# Patient Record
Sex: Male | Born: 1945
Health system: Southern US, Community
[De-identification: ages and names within clinical notes are randomized; demographics above are authoritative.]

## PROBLEM LIST (undated history)

## (undated) DIAGNOSIS — J189 Pneumonia, unspecified organism: Secondary | ICD-10-CM

## (undated) DIAGNOSIS — I10 Essential (primary) hypertension: Secondary | ICD-10-CM

## (undated) DIAGNOSIS — M1990 Unspecified osteoarthritis, unspecified site: Secondary | ICD-10-CM

## (undated) DIAGNOSIS — G4730 Sleep apnea, unspecified: Secondary | ICD-10-CM

## (undated) HISTORY — PX: KNEE ARTHROPLASTY: SHX992

## (undated) HISTORY — PX: VASECTOMY REVERSAL: SHX243

---

## 1968-02-19 HISTORY — PX: VASECTOMY: SHX75

## 2014-01-27 ENCOUNTER — Other Ambulatory Visit: Payer: Self-pay | Admitting: Family Medicine

## 2014-01-27 DIAGNOSIS — Z0000 Encounter for general adult medical examination without abnormal findings: Secondary | ICD-10-CM

## 2014-02-02 ENCOUNTER — Ambulatory Visit
Admission: RE | Admit: 2014-02-02 | Discharge: 2014-02-02 | Disposition: A | Discharge status: Home / Self Care | Payer: Medicare Other | Source: Ambulatory Visit | Attending: Family Medicine | Admitting: Family Medicine

## 2014-02-02 DIAGNOSIS — Z0000 Encounter for general adult medical examination without abnormal findings: Secondary | ICD-10-CM

## 2016-03-29 ENCOUNTER — Ambulatory Visit (INDEPENDENT_AMBULATORY_CARE_PROVIDER_SITE_OTHER): Payer: PPO | Admitting: Internal Medicine

## 2016-03-29 DIAGNOSIS — Z7189 Other specified counseling: Secondary | ICD-10-CM

## 2016-03-29 DIAGNOSIS — Z9189 Other specified personal risk factors, not elsewhere classified: Secondary | ICD-10-CM

## 2016-03-29 DIAGNOSIS — Z789 Other specified health status: Secondary | ICD-10-CM

## 2016-03-29 DIAGNOSIS — Z7184 Encounter for health counseling related to travel: Secondary | ICD-10-CM

## 2016-03-29 DIAGNOSIS — Z23 Encounter for immunization: Secondary | ICD-10-CM

## 2016-03-29 MED ORDER — AZITHROMYCIN 500 MG PO TABS: 500.0000 mg | ORAL_TABLET | Freq: Every day | ORAL | 0 refills | Status: DC

## 2016-03-29 MED ORDER — TYPHOID VACCINE PO CPDR: 1.0000 | DELAYED_RELEASE_CAPSULE | ORAL | 0 refills | Status: DC

## 2016-03-29 NOTE — Progress Notes (Signed)
Subjective:   Ralph Davis is a 71 y.o. male who presents to the Infectious Disease clinic for travel consultation. Planned departure date: march 30   Planned return date: April 10 Countries of travel: [place] Areas in country: rural except 2 days in [place] Accommodations: hotels Purpose of travel: mission Prior travel out of [place]: yes, [place]  uptodate on tetanus, nad flu    Objective:   Medications: Lisinopirl, hctz   Assessment:   No contraindications to travel. none   Plan:   Will recommend hep a and typhoid vaccine  Will give rx for vivotif  Traveler's diarrhea = gave rx for azithromycin  Mosquito bite prevention, no need for malaria proph

## 2016-04-01 DIAGNOSIS — E669 Obesity, unspecified: Secondary | ICD-10-CM | POA: Diagnosis not present

## 2016-04-01 DIAGNOSIS — Z0000 Encounter for general adult medical examination without abnormal findings: Secondary | ICD-10-CM | POA: Diagnosis not present

## 2016-04-01 DIAGNOSIS — R7303 Prediabetes: Secondary | ICD-10-CM | POA: Diagnosis not present

## 2016-04-01 DIAGNOSIS — Z23 Encounter for immunization: Secondary | ICD-10-CM | POA: Diagnosis not present

## 2016-04-01 DIAGNOSIS — I10 Essential (primary) hypertension: Secondary | ICD-10-CM | POA: Diagnosis not present

## 2016-04-01 DIAGNOSIS — Z125 Encounter for screening for malignant neoplasm of prostate: Secondary | ICD-10-CM | POA: Diagnosis not present

## 2016-04-01 DIAGNOSIS — E785 Hyperlipidemia, unspecified: Secondary | ICD-10-CM | POA: Diagnosis not present

## 2016-04-01 DIAGNOSIS — Z6829 Body mass index (BMI) 29.0-29.9, adult: Secondary | ICD-10-CM | POA: Diagnosis not present

## 2016-04-02 DIAGNOSIS — E785 Hyperlipidemia, unspecified: Secondary | ICD-10-CM | POA: Diagnosis not present

## 2016-04-02 DIAGNOSIS — I10 Essential (primary) hypertension: Secondary | ICD-10-CM | POA: Diagnosis not present

## 2016-04-02 DIAGNOSIS — Z0000 Encounter for general adult medical examination without abnormal findings: Secondary | ICD-10-CM | POA: Diagnosis not present

## 2016-04-02 DIAGNOSIS — Z6829 Body mass index (BMI) 29.0-29.9, adult: Secondary | ICD-10-CM | POA: Diagnosis not present

## 2016-04-02 DIAGNOSIS — R7303 Prediabetes: Secondary | ICD-10-CM | POA: Diagnosis not present

## 2016-04-02 DIAGNOSIS — E669 Obesity, unspecified: Secondary | ICD-10-CM | POA: Diagnosis not present

## 2016-04-02 DIAGNOSIS — Z125 Encounter for screening for malignant neoplasm of prostate: Secondary | ICD-10-CM | POA: Diagnosis not present

## 2016-05-02 DIAGNOSIS — J209 Acute bronchitis, unspecified: Secondary | ICD-10-CM | POA: Diagnosis not present

## 2016-06-04 IMAGING — US US AORTA SCREENING (MEDICARE)
1 series · 12 of 12 positions shown · non-contrast
Comparison: None.

CLINICAL DATA: Medicare screening exam for abdominal aortic
aneurysm.

EXAM:
ABDOMINAL AORTA SCREENING ULTRASOUND
TECHNIQUE: Ultrasound examination of the abdominal aorta was performed as a
screening evaluation for abdominal aortic aneurysm.

[Series 1: us aorta screening (medicare) · 0.41mm/px · 12 of 12 slices shown]
[im 1/12]
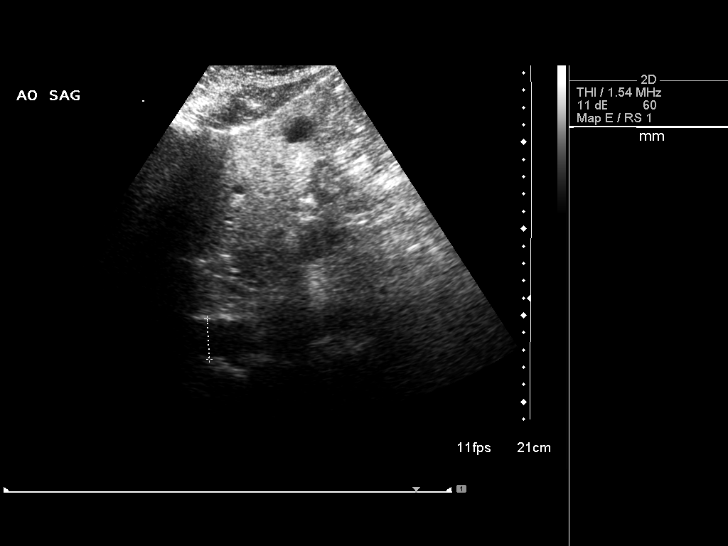
[im 2/12]
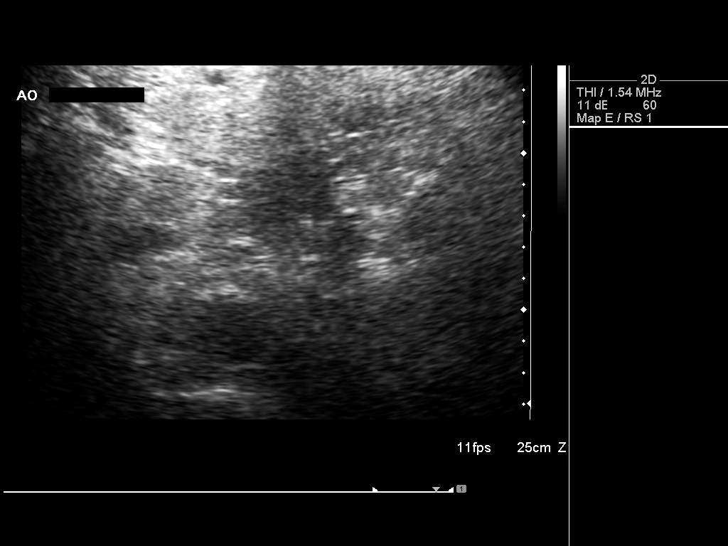
[im 3/12]
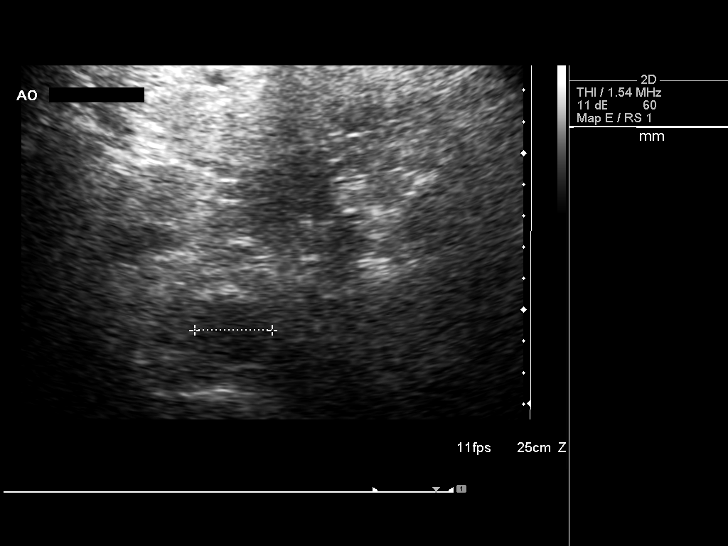
[im 4/12]
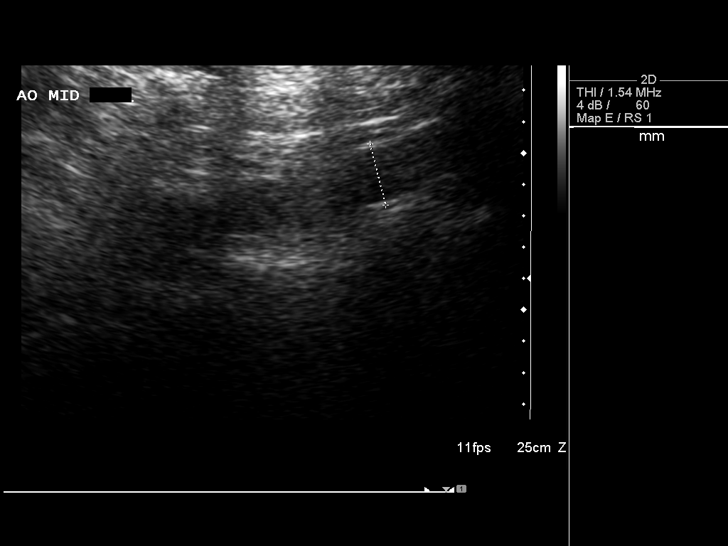
[im 5/12]
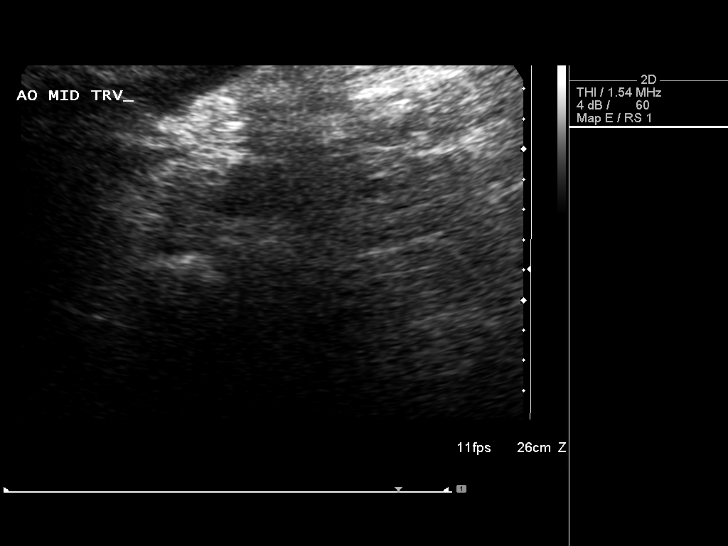
[im 6/12]
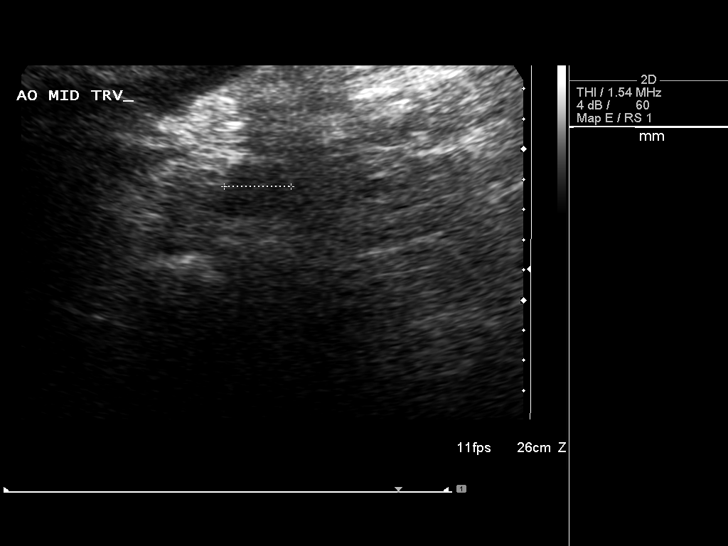
[im 7/12]
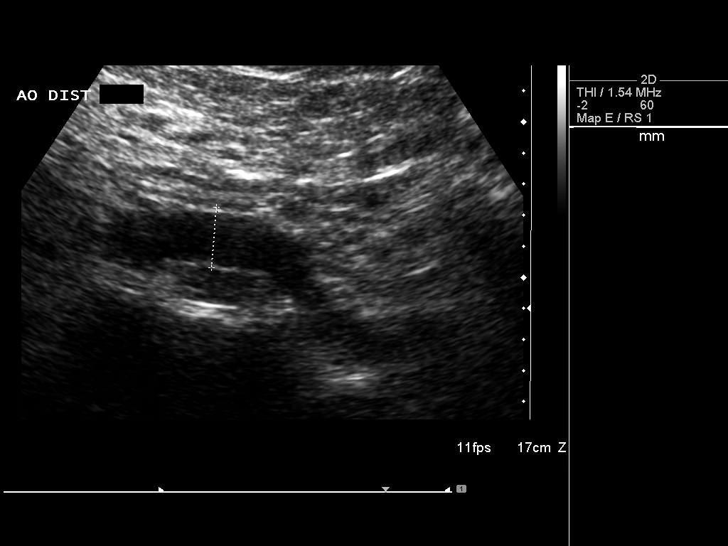
[im 8/12]
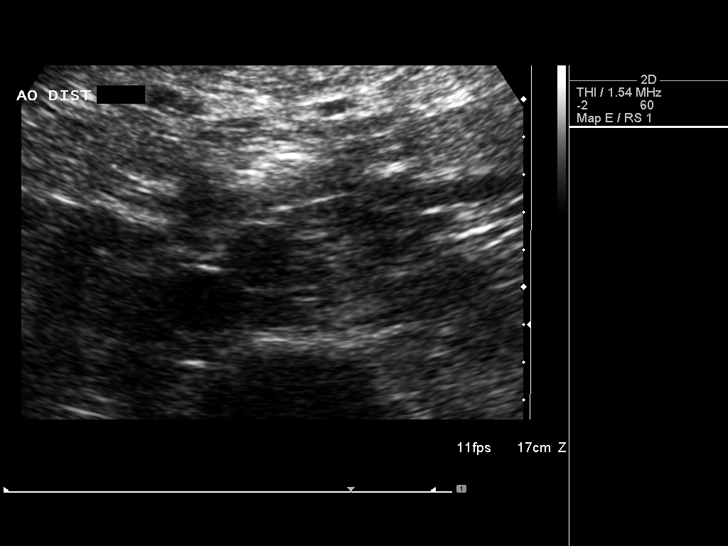
[im 9/12]
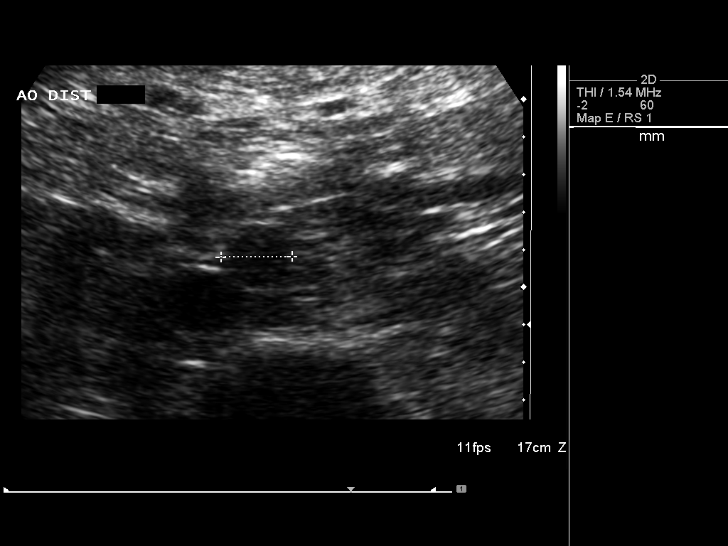
[im 10/12]
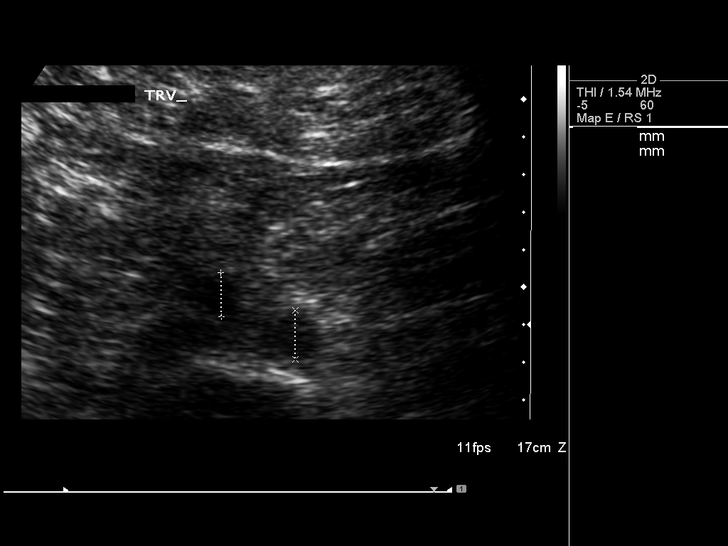
[im 11/12]
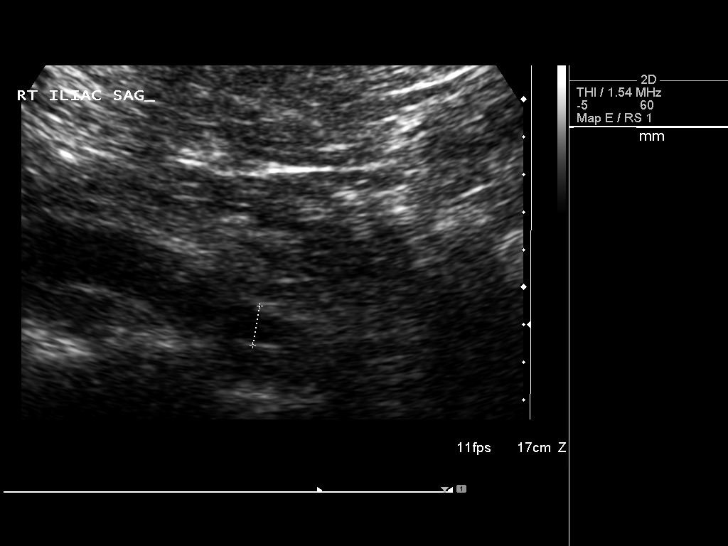
[im 12/12]
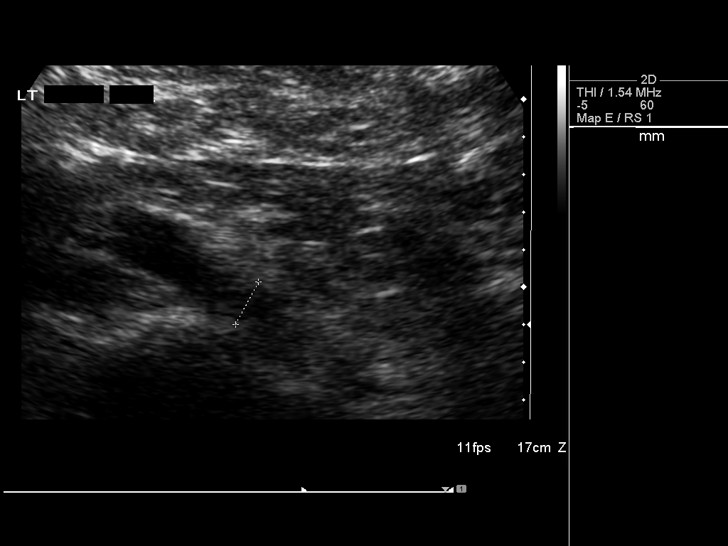

[12 of 12 positions shown; findings below may reference images not displayed]

FINDINGS: Abdominal Aorta

No aneurysm identified.

Maximum AP

Diameter:  2.3 cm

Maximum TRV

Diameter: 2.5 cm

The iliac arteries exhibit maximal diameter of 1.3 cm
IMPRESSION: There is no abdominal aortic aneurysm.

## 2016-10-14 DIAGNOSIS — M1712 Unilateral primary osteoarthritis, left knee: Secondary | ICD-10-CM | POA: Diagnosis not present

## 2016-10-14 DIAGNOSIS — Z6832 Body mass index (BMI) 32.0-32.9, adult: Secondary | ICD-10-CM | POA: Diagnosis not present

## 2016-10-14 DIAGNOSIS — I8393 Asymptomatic varicose veins of bilateral lower extremities: Secondary | ICD-10-CM | POA: Diagnosis not present

## 2016-10-14 DIAGNOSIS — R7303 Prediabetes: Secondary | ICD-10-CM | POA: Diagnosis not present

## 2016-10-14 DIAGNOSIS — L309 Dermatitis, unspecified: Secondary | ICD-10-CM | POA: Diagnosis not present

## 2016-10-14 DIAGNOSIS — E669 Obesity, unspecified: Secondary | ICD-10-CM | POA: Diagnosis not present

## 2016-10-14 DIAGNOSIS — I10 Essential (primary) hypertension: Secondary | ICD-10-CM | POA: Diagnosis not present

## 2016-10-14 DIAGNOSIS — E785 Hyperlipidemia, unspecified: Secondary | ICD-10-CM | POA: Diagnosis not present

## 2016-10-15 DIAGNOSIS — E669 Obesity, unspecified: Secondary | ICD-10-CM | POA: Diagnosis not present

## 2016-10-15 DIAGNOSIS — R7303 Prediabetes: Secondary | ICD-10-CM | POA: Diagnosis not present

## 2016-10-15 DIAGNOSIS — L309 Dermatitis, unspecified: Secondary | ICD-10-CM | POA: Diagnosis not present

## 2016-10-15 DIAGNOSIS — I10 Essential (primary) hypertension: Secondary | ICD-10-CM | POA: Diagnosis not present

## 2016-10-15 DIAGNOSIS — I8393 Asymptomatic varicose veins of bilateral lower extremities: Secondary | ICD-10-CM | POA: Diagnosis not present

## 2016-10-15 DIAGNOSIS — M1712 Unilateral primary osteoarthritis, left knee: Secondary | ICD-10-CM | POA: Diagnosis not present

## 2016-10-15 DIAGNOSIS — E785 Hyperlipidemia, unspecified: Secondary | ICD-10-CM | POA: Diagnosis not present

## 2016-11-26 DIAGNOSIS — H2513 Age-related nuclear cataract, bilateral: Secondary | ICD-10-CM | POA: Diagnosis not present

## 2016-12-17 DIAGNOSIS — Z23 Encounter for immunization: Secondary | ICD-10-CM | POA: Diagnosis not present

## 2017-05-12 DIAGNOSIS — Z0000 Encounter for general adult medical examination without abnormal findings: Secondary | ICD-10-CM | POA: Diagnosis not present

## 2017-05-12 DIAGNOSIS — Z125 Encounter for screening for malignant neoplasm of prostate: Secondary | ICD-10-CM | POA: Diagnosis not present

## 2017-05-12 DIAGNOSIS — Z6827 Body mass index (BMI) 27.0-27.9, adult: Secondary | ICD-10-CM | POA: Diagnosis not present

## 2017-05-12 DIAGNOSIS — I10 Essential (primary) hypertension: Secondary | ICD-10-CM | POA: Diagnosis not present

## 2017-05-12 DIAGNOSIS — Z7189 Other specified counseling: Secondary | ICD-10-CM | POA: Diagnosis not present

## 2017-05-12 DIAGNOSIS — E785 Hyperlipidemia, unspecified: Secondary | ICD-10-CM | POA: Diagnosis not present

## 2017-05-12 DIAGNOSIS — Z1389 Encounter for screening for other disorder: Secondary | ICD-10-CM | POA: Diagnosis not present

## 2017-05-12 DIAGNOSIS — M1712 Unilateral primary osteoarthritis, left knee: Secondary | ICD-10-CM | POA: Diagnosis not present

## 2017-05-12 DIAGNOSIS — E663 Overweight: Secondary | ICD-10-CM | POA: Diagnosis not present

## 2017-09-04 DIAGNOSIS — D3612 Benign neoplasm of peripheral nerves and autonomic nervous system, upper limb, including shoulder: Secondary | ICD-10-CM | POA: Diagnosis not present

## 2017-10-29 DIAGNOSIS — H2513 Age-related nuclear cataract, bilateral: Secondary | ICD-10-CM | POA: Diagnosis not present

## 2017-11-13 DIAGNOSIS — Z79899 Other long term (current) drug therapy: Secondary | ICD-10-CM | POA: Diagnosis not present

## 2017-11-13 DIAGNOSIS — E785 Hyperlipidemia, unspecified: Secondary | ICD-10-CM | POA: Diagnosis not present

## 2017-11-13 DIAGNOSIS — E663 Overweight: Secondary | ICD-10-CM | POA: Diagnosis not present

## 2017-11-13 DIAGNOSIS — Z6827 Body mass index (BMI) 27.0-27.9, adult: Secondary | ICD-10-CM | POA: Diagnosis not present

## 2017-11-13 DIAGNOSIS — I10 Essential (primary) hypertension: Secondary | ICD-10-CM | POA: Diagnosis not present

## 2017-11-13 DIAGNOSIS — Z23 Encounter for immunization: Secondary | ICD-10-CM | POA: Diagnosis not present

## 2018-01-01 DIAGNOSIS — M21612 Bunion of left foot: Secondary | ICD-10-CM | POA: Diagnosis not present

## 2018-01-01 DIAGNOSIS — B079 Viral wart, unspecified: Secondary | ICD-10-CM | POA: Diagnosis not present

## 2018-01-01 DIAGNOSIS — L600 Ingrowing nail: Secondary | ICD-10-CM | POA: Diagnosis not present

## 2018-01-22 DIAGNOSIS — B079 Viral wart, unspecified: Secondary | ICD-10-CM | POA: Diagnosis not present

## 2018-02-06 DIAGNOSIS — B079 Viral wart, unspecified: Secondary | ICD-10-CM | POA: Diagnosis not present

## 2018-03-02 DIAGNOSIS — B079 Viral wart, unspecified: Secondary | ICD-10-CM | POA: Diagnosis not present

## 2018-03-30 DIAGNOSIS — B079 Viral wart, unspecified: Secondary | ICD-10-CM | POA: Diagnosis not present

## 2018-04-22 DIAGNOSIS — L600 Ingrowing nail: Secondary | ICD-10-CM | POA: Diagnosis not present

## 2018-04-27 DIAGNOSIS — M5136 Other intervertebral disc degeneration, lumbar region: Secondary | ICD-10-CM | POA: Diagnosis not present

## 2018-04-27 DIAGNOSIS — M5137 Other intervertebral disc degeneration, lumbosacral region: Secondary | ICD-10-CM | POA: Diagnosis not present

## 2018-04-27 DIAGNOSIS — M5032 Other cervical disc degeneration, mid-cervical region, unspecified level: Secondary | ICD-10-CM | POA: Diagnosis not present

## 2018-04-27 DIAGNOSIS — M9903 Segmental and somatic dysfunction of lumbar region: Secondary | ICD-10-CM | POA: Diagnosis not present

## 2018-04-27 DIAGNOSIS — M9901 Segmental and somatic dysfunction of cervical region: Secondary | ICD-10-CM | POA: Diagnosis not present

## 2018-04-27 DIAGNOSIS — M9904 Segmental and somatic dysfunction of sacral region: Secondary | ICD-10-CM | POA: Diagnosis not present

## 2018-04-29 DIAGNOSIS — M9903 Segmental and somatic dysfunction of lumbar region: Secondary | ICD-10-CM | POA: Diagnosis not present

## 2018-04-29 DIAGNOSIS — M5137 Other intervertebral disc degeneration, lumbosacral region: Secondary | ICD-10-CM | POA: Diagnosis not present

## 2018-04-29 DIAGNOSIS — M9904 Segmental and somatic dysfunction of sacral region: Secondary | ICD-10-CM | POA: Diagnosis not present

## 2018-04-29 DIAGNOSIS — M5032 Other cervical disc degeneration, mid-cervical region, unspecified level: Secondary | ICD-10-CM | POA: Diagnosis not present

## 2018-04-29 DIAGNOSIS — M9901 Segmental and somatic dysfunction of cervical region: Secondary | ICD-10-CM | POA: Diagnosis not present

## 2018-04-29 DIAGNOSIS — M5136 Other intervertebral disc degeneration, lumbar region: Secondary | ICD-10-CM | POA: Diagnosis not present

## 2018-05-05 DIAGNOSIS — M5137 Other intervertebral disc degeneration, lumbosacral region: Secondary | ICD-10-CM | POA: Diagnosis not present

## 2018-05-05 DIAGNOSIS — M5032 Other cervical disc degeneration, mid-cervical region, unspecified level: Secondary | ICD-10-CM | POA: Diagnosis not present

## 2018-05-05 DIAGNOSIS — M9903 Segmental and somatic dysfunction of lumbar region: Secondary | ICD-10-CM | POA: Diagnosis not present

## 2018-05-05 DIAGNOSIS — M5136 Other intervertebral disc degeneration, lumbar region: Secondary | ICD-10-CM | POA: Diagnosis not present

## 2018-05-05 DIAGNOSIS — M9904 Segmental and somatic dysfunction of sacral region: Secondary | ICD-10-CM | POA: Diagnosis not present

## 2018-05-05 DIAGNOSIS — M9901 Segmental and somatic dysfunction of cervical region: Secondary | ICD-10-CM | POA: Diagnosis not present

## 2018-05-07 DIAGNOSIS — M5137 Other intervertebral disc degeneration, lumbosacral region: Secondary | ICD-10-CM | POA: Diagnosis not present

## 2018-05-07 DIAGNOSIS — M5032 Other cervical disc degeneration, mid-cervical region, unspecified level: Secondary | ICD-10-CM | POA: Diagnosis not present

## 2018-05-07 DIAGNOSIS — M9903 Segmental and somatic dysfunction of lumbar region: Secondary | ICD-10-CM | POA: Diagnosis not present

## 2018-05-07 DIAGNOSIS — M5136 Other intervertebral disc degeneration, lumbar region: Secondary | ICD-10-CM | POA: Diagnosis not present

## 2018-05-07 DIAGNOSIS — M9901 Segmental and somatic dysfunction of cervical region: Secondary | ICD-10-CM | POA: Diagnosis not present

## 2018-05-07 DIAGNOSIS — M9904 Segmental and somatic dysfunction of sacral region: Secondary | ICD-10-CM | POA: Diagnosis not present

## 2018-06-09 DIAGNOSIS — M9901 Segmental and somatic dysfunction of cervical region: Secondary | ICD-10-CM | POA: Diagnosis not present

## 2018-06-09 DIAGNOSIS — M9903 Segmental and somatic dysfunction of lumbar region: Secondary | ICD-10-CM | POA: Diagnosis not present

## 2018-06-09 DIAGNOSIS — M9904 Segmental and somatic dysfunction of sacral region: Secondary | ICD-10-CM | POA: Diagnosis not present

## 2018-06-09 DIAGNOSIS — M5032 Other cervical disc degeneration, mid-cervical region, unspecified level: Secondary | ICD-10-CM | POA: Diagnosis not present

## 2018-06-09 DIAGNOSIS — M5136 Other intervertebral disc degeneration, lumbar region: Secondary | ICD-10-CM | POA: Diagnosis not present

## 2018-06-09 DIAGNOSIS — M5137 Other intervertebral disc degeneration, lumbosacral region: Secondary | ICD-10-CM | POA: Diagnosis not present

## 2018-06-11 DIAGNOSIS — M9901 Segmental and somatic dysfunction of cervical region: Secondary | ICD-10-CM | POA: Diagnosis not present

## 2018-06-11 DIAGNOSIS — M5136 Other intervertebral disc degeneration, lumbar region: Secondary | ICD-10-CM | POA: Diagnosis not present

## 2018-06-11 DIAGNOSIS — M9903 Segmental and somatic dysfunction of lumbar region: Secondary | ICD-10-CM | POA: Diagnosis not present

## 2018-06-11 DIAGNOSIS — M5137 Other intervertebral disc degeneration, lumbosacral region: Secondary | ICD-10-CM | POA: Diagnosis not present

## 2018-06-11 DIAGNOSIS — M9904 Segmental and somatic dysfunction of sacral region: Secondary | ICD-10-CM | POA: Diagnosis not present

## 2018-06-11 DIAGNOSIS — M5032 Other cervical disc degeneration, mid-cervical region, unspecified level: Secondary | ICD-10-CM | POA: Diagnosis not present

## 2018-06-16 DIAGNOSIS — M5136 Other intervertebral disc degeneration, lumbar region: Secondary | ICD-10-CM | POA: Diagnosis not present

## 2018-06-16 DIAGNOSIS — M9901 Segmental and somatic dysfunction of cervical region: Secondary | ICD-10-CM | POA: Diagnosis not present

## 2018-06-16 DIAGNOSIS — M9903 Segmental and somatic dysfunction of lumbar region: Secondary | ICD-10-CM | POA: Diagnosis not present

## 2018-06-16 DIAGNOSIS — M5032 Other cervical disc degeneration, mid-cervical region, unspecified level: Secondary | ICD-10-CM | POA: Diagnosis not present

## 2018-06-16 DIAGNOSIS — M9904 Segmental and somatic dysfunction of sacral region: Secondary | ICD-10-CM | POA: Diagnosis not present

## 2018-06-16 DIAGNOSIS — M5137 Other intervertebral disc degeneration, lumbosacral region: Secondary | ICD-10-CM | POA: Diagnosis not present

## 2018-06-18 DIAGNOSIS — M9901 Segmental and somatic dysfunction of cervical region: Secondary | ICD-10-CM | POA: Diagnosis not present

## 2018-06-18 DIAGNOSIS — M5137 Other intervertebral disc degeneration, lumbosacral region: Secondary | ICD-10-CM | POA: Diagnosis not present

## 2018-06-18 DIAGNOSIS — M9903 Segmental and somatic dysfunction of lumbar region: Secondary | ICD-10-CM | POA: Diagnosis not present

## 2018-06-18 DIAGNOSIS — M9904 Segmental and somatic dysfunction of sacral region: Secondary | ICD-10-CM | POA: Diagnosis not present

## 2018-06-18 DIAGNOSIS — M5136 Other intervertebral disc degeneration, lumbar region: Secondary | ICD-10-CM | POA: Diagnosis not present

## 2018-06-18 DIAGNOSIS — M5032 Other cervical disc degeneration, mid-cervical region, unspecified level: Secondary | ICD-10-CM | POA: Diagnosis not present

## 2018-06-23 DIAGNOSIS — M5136 Other intervertebral disc degeneration, lumbar region: Secondary | ICD-10-CM | POA: Diagnosis not present

## 2018-06-23 DIAGNOSIS — M5032 Other cervical disc degeneration, mid-cervical region, unspecified level: Secondary | ICD-10-CM | POA: Diagnosis not present

## 2018-06-23 DIAGNOSIS — M9903 Segmental and somatic dysfunction of lumbar region: Secondary | ICD-10-CM | POA: Diagnosis not present

## 2018-06-23 DIAGNOSIS — M5137 Other intervertebral disc degeneration, lumbosacral region: Secondary | ICD-10-CM | POA: Diagnosis not present

## 2018-06-23 DIAGNOSIS — M9904 Segmental and somatic dysfunction of sacral region: Secondary | ICD-10-CM | POA: Diagnosis not present

## 2018-06-23 DIAGNOSIS — M9901 Segmental and somatic dysfunction of cervical region: Secondary | ICD-10-CM | POA: Diagnosis not present

## 2018-06-25 DIAGNOSIS — M5137 Other intervertebral disc degeneration, lumbosacral region: Secondary | ICD-10-CM | POA: Diagnosis not present

## 2018-06-25 DIAGNOSIS — M5136 Other intervertebral disc degeneration, lumbar region: Secondary | ICD-10-CM | POA: Diagnosis not present

## 2018-06-25 DIAGNOSIS — M9904 Segmental and somatic dysfunction of sacral region: Secondary | ICD-10-CM | POA: Diagnosis not present

## 2018-06-25 DIAGNOSIS — M5032 Other cervical disc degeneration, mid-cervical region, unspecified level: Secondary | ICD-10-CM | POA: Diagnosis not present

## 2018-06-25 DIAGNOSIS — M9901 Segmental and somatic dysfunction of cervical region: Secondary | ICD-10-CM | POA: Diagnosis not present

## 2018-06-25 DIAGNOSIS — M9903 Segmental and somatic dysfunction of lumbar region: Secondary | ICD-10-CM | POA: Diagnosis not present

## 2018-06-30 DIAGNOSIS — M5032 Other cervical disc degeneration, mid-cervical region, unspecified level: Secondary | ICD-10-CM | POA: Diagnosis not present

## 2018-06-30 DIAGNOSIS — M5136 Other intervertebral disc degeneration, lumbar region: Secondary | ICD-10-CM | POA: Diagnosis not present

## 2018-06-30 DIAGNOSIS — M5137 Other intervertebral disc degeneration, lumbosacral region: Secondary | ICD-10-CM | POA: Diagnosis not present

## 2018-06-30 DIAGNOSIS — M9903 Segmental and somatic dysfunction of lumbar region: Secondary | ICD-10-CM | POA: Diagnosis not present

## 2018-06-30 DIAGNOSIS — M9904 Segmental and somatic dysfunction of sacral region: Secondary | ICD-10-CM | POA: Diagnosis not present

## 2018-06-30 DIAGNOSIS — M9901 Segmental and somatic dysfunction of cervical region: Secondary | ICD-10-CM | POA: Diagnosis not present

## 2018-07-07 DIAGNOSIS — M5032 Other cervical disc degeneration, mid-cervical region, unspecified level: Secondary | ICD-10-CM | POA: Diagnosis not present

## 2018-07-07 DIAGNOSIS — M5137 Other intervertebral disc degeneration, lumbosacral region: Secondary | ICD-10-CM | POA: Diagnosis not present

## 2018-07-07 DIAGNOSIS — M9901 Segmental and somatic dysfunction of cervical region: Secondary | ICD-10-CM | POA: Diagnosis not present

## 2018-07-07 DIAGNOSIS — M9904 Segmental and somatic dysfunction of sacral region: Secondary | ICD-10-CM | POA: Diagnosis not present

## 2018-07-07 DIAGNOSIS — M9903 Segmental and somatic dysfunction of lumbar region: Secondary | ICD-10-CM | POA: Diagnosis not present

## 2018-07-07 DIAGNOSIS — M5136 Other intervertebral disc degeneration, lumbar region: Secondary | ICD-10-CM | POA: Diagnosis not present

## 2018-07-15 DIAGNOSIS — M9904 Segmental and somatic dysfunction of sacral region: Secondary | ICD-10-CM | POA: Diagnosis not present

## 2018-07-15 DIAGNOSIS — M5032 Other cervical disc degeneration, mid-cervical region, unspecified level: Secondary | ICD-10-CM | POA: Diagnosis not present

## 2018-07-15 DIAGNOSIS — M9903 Segmental and somatic dysfunction of lumbar region: Secondary | ICD-10-CM | POA: Diagnosis not present

## 2018-07-15 DIAGNOSIS — M5136 Other intervertebral disc degeneration, lumbar region: Secondary | ICD-10-CM | POA: Diagnosis not present

## 2018-07-15 DIAGNOSIS — M9901 Segmental and somatic dysfunction of cervical region: Secondary | ICD-10-CM | POA: Diagnosis not present

## 2018-07-15 DIAGNOSIS — M5137 Other intervertebral disc degeneration, lumbosacral region: Secondary | ICD-10-CM | POA: Diagnosis not present

## 2018-07-21 DIAGNOSIS — M9901 Segmental and somatic dysfunction of cervical region: Secondary | ICD-10-CM | POA: Diagnosis not present

## 2018-07-21 DIAGNOSIS — M5137 Other intervertebral disc degeneration, lumbosacral region: Secondary | ICD-10-CM | POA: Diagnosis not present

## 2018-07-21 DIAGNOSIS — M9904 Segmental and somatic dysfunction of sacral region: Secondary | ICD-10-CM | POA: Diagnosis not present

## 2018-07-21 DIAGNOSIS — M5136 Other intervertebral disc degeneration, lumbar region: Secondary | ICD-10-CM | POA: Diagnosis not present

## 2018-07-21 DIAGNOSIS — M5032 Other cervical disc degeneration, mid-cervical region, unspecified level: Secondary | ICD-10-CM | POA: Diagnosis not present

## 2018-07-21 DIAGNOSIS — M9903 Segmental and somatic dysfunction of lumbar region: Secondary | ICD-10-CM | POA: Diagnosis not present

## 2018-08-17 DIAGNOSIS — E785 Hyperlipidemia, unspecified: Secondary | ICD-10-CM | POA: Diagnosis not present

## 2018-08-17 DIAGNOSIS — E663 Overweight: Secondary | ICD-10-CM | POA: Diagnosis not present

## 2018-08-17 DIAGNOSIS — I10 Essential (primary) hypertension: Secondary | ICD-10-CM | POA: Diagnosis not present

## 2018-09-09 DIAGNOSIS — I10 Essential (primary) hypertension: Secondary | ICD-10-CM | POA: Diagnosis not present

## 2018-09-09 DIAGNOSIS — E785 Hyperlipidemia, unspecified: Secondary | ICD-10-CM | POA: Diagnosis not present

## 2018-09-09 DIAGNOSIS — E663 Overweight: Secondary | ICD-10-CM | POA: Diagnosis not present

## 2018-10-05 DIAGNOSIS — M9903 Segmental and somatic dysfunction of lumbar region: Secondary | ICD-10-CM | POA: Diagnosis not present

## 2018-10-05 DIAGNOSIS — M5032 Other cervical disc degeneration, mid-cervical region, unspecified level: Secondary | ICD-10-CM | POA: Diagnosis not present

## 2018-10-05 DIAGNOSIS — M9904 Segmental and somatic dysfunction of sacral region: Secondary | ICD-10-CM | POA: Diagnosis not present

## 2018-10-05 DIAGNOSIS — M5137 Other intervertebral disc degeneration, lumbosacral region: Secondary | ICD-10-CM | POA: Diagnosis not present

## 2018-10-05 DIAGNOSIS — M9901 Segmental and somatic dysfunction of cervical region: Secondary | ICD-10-CM | POA: Diagnosis not present

## 2018-10-05 DIAGNOSIS — M5136 Other intervertebral disc degeneration, lumbar region: Secondary | ICD-10-CM | POA: Diagnosis not present

## 2018-12-28 DIAGNOSIS — M1611 Unilateral primary osteoarthritis, right hip: Secondary | ICD-10-CM | POA: Diagnosis not present

## 2019-01-11 DIAGNOSIS — M25551 Pain in right hip: Secondary | ICD-10-CM | POA: Diagnosis not present

## 2019-01-28 DIAGNOSIS — M1611 Unilateral primary osteoarthritis, right hip: Secondary | ICD-10-CM | POA: Diagnosis not present

## 2019-01-28 DIAGNOSIS — M47816 Spondylosis without myelopathy or radiculopathy, lumbar region: Secondary | ICD-10-CM | POA: Diagnosis not present

## 2019-02-09 DIAGNOSIS — M47816 Spondylosis without myelopathy or radiculopathy, lumbar region: Secondary | ICD-10-CM | POA: Diagnosis not present

## 2019-02-09 DIAGNOSIS — M1611 Unilateral primary osteoarthritis, right hip: Secondary | ICD-10-CM | POA: Diagnosis not present

## 2019-03-02 DIAGNOSIS — M25551 Pain in right hip: Secondary | ICD-10-CM | POA: Diagnosis not present

## 2019-03-02 DIAGNOSIS — M1611 Unilateral primary osteoarthritis, right hip: Secondary | ICD-10-CM | POA: Diagnosis not present

## 2019-03-02 DIAGNOSIS — M47816 Spondylosis without myelopathy or radiculopathy, lumbar region: Secondary | ICD-10-CM | POA: Diagnosis not present

## 2019-03-05 ENCOUNTER — Telehealth: Payer: PPO | Admitting: Nurse Practitioner

## 2019-03-05 DIAGNOSIS — Z20822 Contact with and (suspected) exposure to covid-19: Secondary | ICD-10-CM

## 2019-03-05 NOTE — Progress Notes (Signed)
E-Visit for [place] has multiple testing sites. For information on our [place] testing locations and hours go to [email or web address]  We are enrolling you in our [place] for [place] . Daily you will receive a questionnaire within the [place] website. Our COVID 19 response team will be monitoring your responses daily.  Testing Information: The COVID-19 Community Testing sites will begin testing BY APPOINTMENT ONLY.  You can schedule online at [email or web address]  If you do not have access to a smart phone or computer you may call [phone] for an appointment.   Additional testing sites in the Community:  . For [facility] Testing sites in [facility]  FaceUpdate.[email or web address]  . For Pop-up testing sites in [place]  BowlDirectory.co.uk  . For Testing sites with regular hours [email or web address]  . For [place] [email or web address]  . For [facility] [email or web address]  . For [facility] testing in [place] and [employer] [email or web address]  . For [facility] testing in [facility]   [email or web address]  For  more information about community testing call [phone]   Please quarantine yourself while awaiting your test results. Please stay home for a minimum of 10 days from the first day of illness with improving symptoms and you have had 24 hours of no fever (without the use of Tylenol (Acetaminophen) Motrin (Ibuprofen) or any fever reducing medication).  Also - Do not get tested prior to returning to work because once you have had a positive test  the test can stay positive for more then a month in some cases.   You should wear a mask or cloth face covering over your nose and mouth if you must be around other people or animals, including pets (even at home). Try to stay at least 6 feet away from other people. This will protect the people around you.  Please continue good preventive care measures, including:  frequent hand-washing, avoid touching your face, cover coughs/sneezes, stay out of crowds and keep a 6 foot distance from others.  COVID-19 is a respiratory illness with symptoms that are similar to the flu. Symptoms are typically mild to moderate, but there have been cases of severe illness and death due to the virus.   The following symptoms may appear 2-14 days after exposure: . Fever . Cough . Shortness of breath or difficulty breathing . Chills . Repeated shaking with chills . Muscle pain . Headache . Sore throat . New loss of taste or smell . Fatigue . Congestion or runny nose . Nausea or vomiting . Diarrhea  Go to the nearest hospital ED for assessment if fever/cough/breathlessness are severe or illness seems like a threat to life.  It is vitally important that if you feel that you have an infection such as this virus or any other virus that you stay home and away from places where you may spread it to others.  You should avoid contact with people age 75 and older.   You can use medication such as delsym if develop a cough  You may also take acetaminophen (Tylenol) as needed for fever.  Reduce your risk of any infection by using the same precautions used for avoiding the common cold or flu:  Marland Kitchen Wash your hands often with soap and warm water for at least 20 seconds.  If soap and water are not readily available, use an alcohol-based hand sanitizer with at least 60% alcohol.  . If coughing or sneezing, cover your mouth and  nose by coughing or sneezing into the elbow areas of your shirt or coat, into a tissue or into your  sleeve (not your hands). . Avoid shaking hands with others and consider head nods or verbal greetings only. . Avoid touching your eyes, nose, or mouth with unwashed hands.  . Avoid close contact with people who are sick. . Avoid places or events with large numbers of people in one location, like concerts or sporting events. . Carefully consider travel plans you have or are making. . If you are planning any travel outside or inside the [place], visit the CDC's Travelers' Health webpage for the latest health notices. . If you have some symptoms but not all symptoms, continue to monitor at home and seek medical attention if your symptoms worsen. . If you are having a medical emergency, call 911.  HOME CARE . Only take medications as instructed by your medical team. . Drink plenty of fluids and get plenty of rest. . A steam or ultrasonic humidifier can help if you have congestion.   GET HELP RIGHT AWAY IF YOU HAVE EMERGENCY WARNING SIGNS** FOR COVID-19. If you or someone is showing any of these signs seek emergency medical care immediately. Call 911 or proceed to your closest emergency facility if: . You develop worsening high fever. . Trouble breathing . Bluish lips or face . Persistent pain or pressure in the chest . New confusion . Inability to wake or stay awake . You cough up blood. . Your symptoms become more severe  **This list is not all possible symptoms. Contact your medical provider for any symptoms that are sever or concerning to you.  MAKE SURE YOU   Understand these instructions.  Will watch your condition.  Will get help right away if you are not doing well or get worse.  Your e-visit answers were reviewed by a board certified advanced clinical practitioner to complete your personal care plan.  Depending on the condition, your plan could have included both over the counter or prescription medications.  If there is a problem please reply once you have received a response from  your provider.  Your safety is important to [place].  If you have drug allergies check your prescription carefully.    You can use MyChart to ask questions about today's visit, request a non-urgent call back, or ask for a work or school excuse for 24 hours related to this e-Visit. If it has been greater than 24 hours you will need to follow up with your provider, or enter a new e-Visit to address those concerns. You will get an e-mail in the next two days asking about your experience.  I hope that your e-visit has been valuable and will speed your recovery. Thank you for using e-visits.   5-10 minutes spent reviewing and documenting in chart.

## 2019-03-09 ENCOUNTER — Other Ambulatory Visit: Payer: PPO

## 2019-03-10 ENCOUNTER — Ambulatory Visit: Payer: PPO | Attending: Internal Medicine

## 2019-03-10 DIAGNOSIS — Z23 Encounter for immunization: Secondary | ICD-10-CM | POA: Insufficient documentation

## 2019-03-10 DIAGNOSIS — M25551 Pain in right hip: Secondary | ICD-10-CM | POA: Diagnosis not present

## 2019-03-10 NOTE — Progress Notes (Signed)
   Covid-19 Vaccination Clinic  Name:  Ernesto Carrel    MRN: [id number] DOB: 28-Feb-1945  03/10/2019  Mr. Jepperson was observed post Covid-19 immunization for 15 minutes without incidence. He was provided with Vaccine Information Sheet and instruction to access the V-Safe system.   Mr. Elick was instructed to call 911 with any severe reactions post vaccine: Marland Kitchen Difficulty breathing  . Swelling of your face and throat  . A fast heartbeat  . A bad rash all over your body  . Dizziness and weakness    Immunizations Administered    Name Date Dose VIS Date Route   Pfizer COVID-19 Vaccine 03/10/2019  6:14 PM 0.3 mL 01/29/2019 Intramuscular   Manufacturer: [place]   Lot: [id number]   [place]: [id number]

## 2019-03-16 DIAGNOSIS — Z131 Encounter for screening for diabetes mellitus: Secondary | ICD-10-CM | POA: Diagnosis not present

## 2019-03-16 DIAGNOSIS — R799 Abnormal finding of blood chemistry, unspecified: Secondary | ICD-10-CM | POA: Diagnosis not present

## 2019-03-16 DIAGNOSIS — Z13220 Encounter for screening for lipoid disorders: Secondary | ICD-10-CM | POA: Diagnosis not present

## 2019-03-16 DIAGNOSIS — R635 Abnormal weight gain: Secondary | ICD-10-CM | POA: Diagnosis not present

## 2019-03-25 DIAGNOSIS — R635 Abnormal weight gain: Secondary | ICD-10-CM | POA: Diagnosis not present

## 2019-03-25 DIAGNOSIS — Z6832 Body mass index (BMI) 32.0-32.9, adult: Secondary | ICD-10-CM | POA: Diagnosis not present

## 2019-03-25 DIAGNOSIS — R7301 Impaired fasting glucose: Secondary | ICD-10-CM | POA: Diagnosis not present

## 2019-03-25 DIAGNOSIS — I10 Essential (primary) hypertension: Secondary | ICD-10-CM | POA: Diagnosis not present

## 2019-03-25 DIAGNOSIS — E7800 Pure hypercholesterolemia, unspecified: Secondary | ICD-10-CM | POA: Diagnosis not present

## 2019-03-31 ENCOUNTER — Ambulatory Visit: Payer: Medicare Other | Attending: Internal Medicine

## 2019-03-31 DIAGNOSIS — Z23 Encounter for immunization: Secondary | ICD-10-CM | POA: Insufficient documentation

## 2019-03-31 NOTE — Progress Notes (Signed)
   Covid-19 Vaccination Clinic  Name:  Ralph Davis    MRN: [id number] DOB: 18-Dec-1945  03/31/2019  Ralph Davis was observed post Covid-19 immunization for 15 minutes without incidence. He was provided with Vaccine Information Sheet and instruction to access the V-Safe system.   Ralph Davis was instructed to call 911 with any severe reactions post vaccine: Marland Kitchen Difficulty breathing  . Swelling of your face and throat  . A fast heartbeat  . A bad rash all over your body  . Dizziness and weakness    Immunizations Administered    Name Date Dose VIS Date Route   Pfizer COVID-19 Vaccine 03/31/2019  8:39 AM 0.3 mL 01/29/2019 Intramuscular   Manufacturer: [place]   Lot: [id number]   [place]: [id number]

## 2019-04-06 DIAGNOSIS — E669 Obesity, unspecified: Secondary | ICD-10-CM | POA: Diagnosis not present

## 2019-04-06 DIAGNOSIS — I10 Essential (primary) hypertension: Secondary | ICD-10-CM | POA: Diagnosis not present

## 2019-04-06 DIAGNOSIS — Z8669 Personal history of other diseases of the nervous system and sense organs: Secondary | ICD-10-CM | POA: Diagnosis not present

## 2019-04-06 DIAGNOSIS — E785 Hyperlipidemia, unspecified: Secondary | ICD-10-CM | POA: Diagnosis not present

## 2019-04-06 DIAGNOSIS — Z6832 Body mass index (BMI) 32.0-32.9, adult: Secondary | ICD-10-CM | POA: Diagnosis not present

## 2019-04-09 DIAGNOSIS — E669 Obesity, unspecified: Secondary | ICD-10-CM | POA: Diagnosis not present

## 2019-04-09 DIAGNOSIS — Z01818 Encounter for other preprocedural examination: Secondary | ICD-10-CM | POA: Diagnosis not present

## 2019-04-09 DIAGNOSIS — R7303 Prediabetes: Secondary | ICD-10-CM | POA: Diagnosis not present

## 2019-04-09 DIAGNOSIS — R635 Abnormal weight gain: Secondary | ICD-10-CM | POA: Diagnosis not present

## 2019-04-09 DIAGNOSIS — I10 Essential (primary) hypertension: Secondary | ICD-10-CM | POA: Diagnosis not present

## 2019-04-09 DIAGNOSIS — E785 Hyperlipidemia, unspecified: Secondary | ICD-10-CM | POA: Diagnosis not present

## 2019-04-09 DIAGNOSIS — Z125 Encounter for screening for malignant neoplasm of prostate: Secondary | ICD-10-CM | POA: Diagnosis not present

## 2019-04-15 DIAGNOSIS — E669 Obesity, unspecified: Secondary | ICD-10-CM | POA: Diagnosis not present

## 2019-04-15 DIAGNOSIS — Z6832 Body mass index (BMI) 32.0-32.9, adult: Secondary | ICD-10-CM | POA: Diagnosis not present

## 2019-04-21 DIAGNOSIS — Z6832 Body mass index (BMI) 32.0-32.9, adult: Secondary | ICD-10-CM | POA: Diagnosis not present

## 2019-04-21 DIAGNOSIS — Z713 Dietary counseling and surveillance: Secondary | ICD-10-CM | POA: Diagnosis not present

## 2019-04-21 DIAGNOSIS — E669 Obesity, unspecified: Secondary | ICD-10-CM | POA: Diagnosis not present

## 2019-04-22 ENCOUNTER — Ambulatory Visit: Payer: Self-pay | Admitting: Physician Assistant

## 2019-04-22 DIAGNOSIS — M25551 Pain in right hip: Secondary | ICD-10-CM | POA: Diagnosis not present

## 2019-04-22 NOTE — H&P (View-Only) (Signed)
TOTAL HIP ADMISSION H&P  Patient is admitted for right total hip arthroplasty.  Subjective:  Chief Complaint: right hip pain  HPI: Ralph Davis, 74 y.o. male, has a history of pain and functional disability in the right hip(s) due to arthritis and patient has failed non-surgical conservative treatments for greater than 12 weeks to include NSAID's and/or analgesics, corticosteriod injections and activity modification.  Onset of symptoms was gradual starting 8 years ago with gradually worsening course since that time.The patient noted no past surgery on the right hip(s).  Patient currently rates pain in the right hip at 8 out of 10 with activity. Patient has night pain, worsening of pain with activity and weight bearing, pain that interfers with activities of daily living and pain with passive range of motion. Patient has evidence of subchondral cysts, periarticular osteophytes and joint space narrowing by imaging studies. This condition presents safety issues increasing the risk of falls.   There is no current active infection.  There are no problems to display for this patient.  PMH: HTN, OSA   Current Outpatient Medications  Medication Sig Dispense Refill Last Dose  . azithromycin (ZITHROMAX) 500 MG tablet Take 1 tablet (500 mg total) by mouth daily. If you have 3+ loose stools in 24hr. Can stop taking if diarrhea resolves 5 tablet 0   . hydrochlorothiazide (HYDRODIURIL) 25 MG tablet Take 25 mg by mouth daily.     Marland Kitchen HYDROCHLOROTHIAZIDE PO Take by mouth.     Marland Kitchen lisinopril (ZESTRIL) 40 MG tablet Take 40 mg by mouth daily.     Marland Kitchen LISINOPRIL PO Take by mouth.     . meloxicam (MOBIC) 15 MG tablet Take 15 mg by mouth daily.     . traMADol (ULTRAM) 50 MG tablet Take 50 mg by mouth 3 (three) times daily as needed.     . typhoid (VIVOTIF) DR capsule Take 1 capsule by mouth every other day. Keep refrigerated. Take on empty stomach with room temp water 4 capsule 0    No current facility-administered  medications for this visit.   No Known Allergies  Social History   Tobacco Use  . Smoking status: Not on file  Substance Use Topics  . Alcohol use: Not on file    No family history on file.   Review of Systems  HENT: Positive for tinnitus.   Gastrointestinal: Positive for constipation, diarrhea, nausea and vomiting.  Musculoskeletal: Positive for arthralgias.  All other systems reviewed and are negative.   Objective:  Physical Exam  Constitutional: He is oriented to person, place, and time. He appears well-developed and well-nourished. No distress.  HENT:  Head: Normocephalic and atraumatic.  Eyes: Pupils are equal, round, and reactive to light. Conjunctivae and EOM are normal.  Cardiovascular: Normal rate, regular rhythm, normal heart sounds and intact distal pulses.  Respiratory: Effort normal and breath sounds normal. No respiratory distress. He has no wheezes.  GI: Soft. Bowel sounds are normal. He exhibits no distension. There is no abdominal tenderness.  Musculoskeletal:     Cervical back: Normal range of motion and neck supple.     Right hip: Tenderness and bony tenderness present. Decreased range of motion.  Lymphadenopathy:    He has no cervical adenopathy.  Neurological: He is alert and oriented to person, place, and time.  Skin: Skin is warm and dry. No rash noted. No erythema.  Psychiatric: He has a normal mood and affect. His behavior is normal.    Vital signs in last 24 hours: @  [id number]  Labs:   There is no height or weight on file to calculate BMI.   Imaging Review Plain radiographs demonstrate severe degenerative joint disease of the right hip(s). The bone quality appears to be good for age and reported activity level.      Assessment/Plan:  End stage arthritis, right hip(s)  The patient history, physical examination, clinical judgement of the provider and imaging studies are consistent with end stage degenerative joint disease of the right  hip(s) and total hip arthroplasty is deemed medically necessary. The treatment options including medical management, injection therapy, arthroscopy and arthroplasty were discussed at length. The risks and benefits of total hip arthroplasty were presented and reviewed. The risks due to aseptic loosening, infection, stiffness, dislocation/subluxation,  thromboembolic complications and other imponderables were discussed.  The patient acknowledged the explanation, agreed to proceed with the plan and consent was signed. Patient is being admitted for inpatient treatment for surgery, pain control, PT, OT, prophylactic antibiotics, VTE prophylaxis, progressive ambulation and ADL's and discharge planning.The patient is planning to be discharged home with outpt PT   Anticipated LOS equal to or greater than 2 midnights due to - Age 28 and older with one or more of the following:  - Obesity  - Expected need for hospital services (PT, OT, Nursing) required for safe  discharge  - Anticipated need for postoperative skilled nursing care or inpatient rehab  - Active co-morbidities: None OR   - Unanticipated findings during/Post Surgery: None  - Patient is a high risk of re-admission due to: None

## 2019-04-22 NOTE — H&P (Signed)
TOTAL HIP ADMISSION H&P  Patient is admitted for right total hip arthroplasty.  Subjective:  Chief Complaint: right hip pain  HPI: Ralph Davis, 74 y.o. male, has a history of pain and functional disability in the right hip(s) due to arthritis and patient has failed non-surgical conservative treatments for greater than 12 weeks to include NSAID's and/or analgesics, corticosteriod injections and activity modification.  Onset of symptoms was gradual starting 8 years ago with gradually worsening course since that time.The patient noted no past surgery on the right hip(s).  Patient currently rates pain in the right hip at 8 out of 10 with activity. Patient has night pain, worsening of pain with activity and weight bearing, pain that interfers with activities of daily living and pain with passive range of motion. Patient has evidence of subchondral cysts, periarticular osteophytes and joint space narrowing by imaging studies. This condition presents safety issues increasing the risk of falls.   There is no current active infection.  There are no problems to display for this patient.  PMH: HTN, OSA   Current Outpatient Medications  Medication Sig Dispense Refill Last Dose  . azithromycin (ZITHROMAX) 500 MG tablet Take 1 tablet (500 mg total) by mouth daily. If you have 3+ loose stools in 24hr. Can stop taking if diarrhea resolves 5 tablet 0   . hydrochlorothiazide (HYDRODIURIL) 25 MG tablet Take 25 mg by mouth daily.     Marland Kitchen HYDROCHLOROTHIAZIDE PO Take by mouth.     Marland Kitchen lisinopril (ZESTRIL) 40 MG tablet Take 40 mg by mouth daily.     Marland Kitchen LISINOPRIL PO Take by mouth.     . meloxicam (MOBIC) 15 MG tablet Take 15 mg by mouth daily.     . traMADol (ULTRAM) 50 MG tablet Take 50 mg by mouth 3 (three) times daily as needed.     . typhoid (VIVOTIF) DR capsule Take 1 capsule by mouth every other day. Keep refrigerated. Take on empty stomach with room temp water 4 capsule 0    No current facility-administered  medications for this visit.   No Known Allergies  Social History   Tobacco Use  . Smoking status: Not on file  Substance Use Topics  . Alcohol use: Not on file    No family history on file.   Review of Systems  HENT: Positive for tinnitus.   Gastrointestinal: Positive for constipation, diarrhea, nausea and vomiting.  Musculoskeletal: Positive for arthralgias.  All other systems reviewed and are negative.   Objective:  Physical Exam  Constitutional: He is oriented to person, place, and time. He appears well-developed and well-nourished. No distress.  HENT:  Head: Normocephalic and atraumatic.  Eyes: Pupils are equal, round, and reactive to light. Conjunctivae and EOM are normal.  Cardiovascular: Normal rate, regular rhythm, normal heart sounds and intact distal pulses.  Respiratory: Effort normal and breath sounds normal. No respiratory distress. He has no wheezes.  GI: Soft. Bowel sounds are normal. He exhibits no distension. There is no abdominal tenderness.  Musculoskeletal:     Cervical back: Normal range of motion and neck supple.     Right hip: Tenderness and bony tenderness present. Decreased range of motion.  Lymphadenopathy:    He has no cervical adenopathy.  Neurological: He is alert and oriented to person, place, and time.  Skin: Skin is warm and dry. No rash noted. No erythema.  Psychiatric: He has a normal mood and affect. His behavior is normal.    Vital signs in last 24 hours: @  [id number]  Labs:   There is no height or weight on file to calculate BMI.   Imaging Review Plain radiographs demonstrate severe degenerative joint disease of the right hip(s). The bone quality appears to be good for age and reported activity level.      Assessment/Plan:  End stage arthritis, right hip(s)  The patient history, physical examination, clinical judgement of the provider and imaging studies are consistent with end stage degenerative joint disease of the right  hip(s) and total hip arthroplasty is deemed medically necessary. The treatment options including medical management, injection therapy, arthroscopy and arthroplasty were discussed at length. The risks and benefits of total hip arthroplasty were presented and reviewed. The risks due to aseptic loosening, infection, stiffness, dislocation/subluxation,  thromboembolic complications and other imponderables were discussed.  The patient acknowledged the explanation, agreed to proceed with the plan and consent was signed. Patient is being admitted for inpatient treatment for surgery, pain control, PT, OT, prophylactic antibiotics, VTE prophylaxis, progressive ambulation and ADL's and discharge planning.The patient is planning to be discharged home with outpt PT   Anticipated LOS equal to or greater than 2 midnights due to - Age 17 and older with one or more of the following:  - Obesity  - Expected need for hospital services (PT, OT, Nursing) required for safe  discharge  - Anticipated need for postoperative skilled nursing care or inpatient rehab  - Active co-morbidities: None OR   - Unanticipated findings during/Post Surgery: None  - Patient is a high risk of re-admission due to: None

## 2019-04-26 DIAGNOSIS — Z6832 Body mass index (BMI) 32.0-32.9, adult: Secondary | ICD-10-CM | POA: Diagnosis not present

## 2019-04-26 DIAGNOSIS — E669 Obesity, unspecified: Secondary | ICD-10-CM | POA: Diagnosis not present

## 2019-04-26 DIAGNOSIS — Z6831 Body mass index (BMI) 31.0-31.9, adult: Secondary | ICD-10-CM | POA: Diagnosis not present

## 2019-04-26 DIAGNOSIS — G4733 Obstructive sleep apnea (adult) (pediatric): Secondary | ICD-10-CM | POA: Diagnosis not present

## 2019-04-28 NOTE — Patient Instructions (Addendum)
DUE TO COVID-19 ONLY ONE VISITOR IS ALLOWED TO COME WITH YOU AND STAY IN THE WAITING ROOM ONLY DURING PRE OP AND PROCEDURE DAY OF SURGERY. THE 1 VISITOR MAY VISIT WITH YOU AFTER SURGERY IN YOUR PRIVATE ROOM DURING VISITING HOURS ONLY!  YOU NEED TO HAVE A COVID 19 TEST ON: 05/04/19 @ 2:00 pm , THIS TEST MUST BE DONE BEFORE SURGERY, COME  [address], [address] [place] , [id number].  ([place]) ONCE YOUR COVID TEST IS COMPLETED, PLEASE BEGIN THE QUARANTINE INSTRUCTIONS AS OUTLINED IN YOUR HANDOUT.                Darby Kaup    Your procedure is scheduled on: 05/07/19   Report to [facility] Main  Entrance   Report to SHORT STAY at: 5:30 AM     Call this number if you have problems the morning of surgery [phone]    Remember:    [place], NO CHEWING GUM [place].     Take these medicines the morning of surgery with A SIP OF WATER: N/A              You may not have any metal on your body including hair pins and              piercings  Do not wear jewelry, lotions, powders or perfumes, deodorant             Men may shave face and neck.   Do not bring valuables to the hospital. [place].  Contacts, dentures or bridgework may not be worn into surgery.  Leave suitcase in the car. After surgery it may be brought to your room.     Patients discharged the day of surgery will not be allowed to drive home. IF YOU ARE HAVING SURGERY AND GOING HOME THE SAME DAY, YOU MUST HAVE AN ADULT TO DRIVE YOU HOME AND BE WITH YOU FOR 24 HOURS. YOU MAY GO HOME BY TAXI OR UBER OR ORTHERWISE, BUT AN ADULT MUST ACCOMPANY YOU HOME AND STAY WITH YOU FOR 24 HOURS.  Name and phone number of your driver:  Special Instructions: N/A              Please read over the following fact sheets you were  given: _____________________________________________________________________             NO SOLID FOOD AFTER MIDNIGHT THE NIGHT PRIOR TO SURGERY. NOTHING BY MOUTH EXCEPT CLEAR LIQUIDS UNTIL: 4:30 AM . PLEASE FINISH ENSURE DRINK PER SURGEON ORDER  WHICH NEEDS TO BE COMPLETED AT: 4:30 AM.   CLEAR LIQUID DIET   Foods Allowed                                                                     Foods Excluded  Coffee and tea, regular and decaf                             liquids that you cannot  Plain Jell-O any favor except red  or purple                                           see through such as: Fruit ices (not with fruit pulp)                                     milk, soups, orange juice  Iced Popsicles                                    All solid food Carbonated beverages, regular and diet                                    Cranberry, grape and apple juices Sports drinks like Gatorade Lightly seasoned clear broth or consume(fat free) Sugar, honey syrup  Sample Menu Breakfast                                Lunch                                     Supper Cranberry juice                    Beef broth                            Chicken broth Jell-O                                     Grape juice                           Apple juice Coffee or tea                        Jell-O                                      Popsicle                                                Coffee or tea                        Coffee or tea  _____________________________________________________________________  [facility] - Preparing for Surgery Before surgery, you can play an important role.  Because skin is not sterile, your skin needs to be as free of germs as possible.  You can reduce the number of germs on your skin by washing with CHG (chlorahexidine gluconate) soap before surgery.  CHG is an antiseptic cleaner which kills germs and bonds with the skin to continue killing germs even  after  washing. Please DO NOT use if you have an allergy to CHG or antibacterial soaps.  If your skin becomes reddened/irritated stop using the CHG and inform your nurse when you arrive at Short Stay. Do not shave (including legs and underarms) for at least 48 hours prior to the first CHG shower.  You may shave your face/neck. Please follow these instructions carefully:  1.  Shower with CHG Soap the night before surgery and the  morning of Surgery.  2.  If you choose to wash your hair, wash your hair first as usual with your  normal  shampoo.  3.  After you shampoo, rinse your hair and body thoroughly to remove the  shampoo.                           4.  Use CHG as you would any other liquid soap.  You can apply chg directly  to the skin and wash                       Gently with a scrungie or clean washcloth.  5.  Apply the CHG Soap to your body ONLY FROM THE NECK DOWN.   Do not use on face/ open                           Wound or open sores. Avoid contact with eyes, ears mouth and genitals (private parts).                       Wash face,  Genitals (private parts) with your normal soap.             6.  Wash thoroughly, paying special attention to the area where your surgery  will be performed.  7.  Thoroughly rinse your body with warm water from the neck down.  8.  DO NOT shower/wash with your normal soap after using and rinsing off  the CHG Soap.                9.  Pat yourself dry with a clean towel.            10.  Wear clean pajamas.            11.  Place clean sheets on your bed the night of your first shower and do not  sleep with pets. Day of Surgery : Do not apply any lotions/deodorants the morning of surgery.  Please wear clean clothes to the hospital/surgery center.  FAILURE TO FOLLOW THESE INSTRUCTIONS MAY RESULT IN THE CANCELLATION OF YOUR SURGERY PATIENT SIGNATURE_________________________________  NURSE  SIGNATURE__________________________________  ________________________________________________________________________   Adam Phenix  An incentive spirometer is a tool that can help keep your lungs clear and active. This tool measures how well you are filling your lungs with each breath. Taking long deep breaths may help reverse or decrease the chance of developing breathing (pulmonary) problems (especially infection) following:  A long period of time when you are unable to move or be active. BEFORE THE PROCEDURE   If the spirometer includes an indicator to show your best effort, your nurse or respiratory therapist will set it to a desired goal.  If possible, sit up straight or lean slightly forward. Try not to slouch.  Hold the incentive spirometer in an upright position. INSTRUCTIONS FOR USE  1. Sit on the edge  of your bed if possible, or sit up as far as you can in bed or on a chair. 2. Hold the incentive spirometer in an upright position. 3. Breathe out normally. 4. Place the mouthpiece in your mouth and seal your lips tightly around it. 5. Breathe in slowly and as deeply as possible, raising the piston or the ball toward the top of the column. 6. Hold your breath for 3-5 seconds or for as long as possible. Allow the piston or ball to fall to the bottom of the column. 7. Remove the mouthpiece from your mouth and breathe out normally. 8. Rest for a few seconds and repeat Steps 1 through 7 at least 10 times every 1-2 hours when you are awake. Take your time and take a few normal breaths between deep breaths. 9. The spirometer may include an indicator to show your best effort. Use the indicator as a goal to work toward during each repetition. 10. After each set of 10 deep breaths, practice coughing to be sure your lungs are clear. If you have an incision (the cut made at the time of surgery), support your incision when coughing by placing a pillow or rolled up towels firmly  against it. Once you are able to get out of bed, walk around indoors and cough well. You may stop using the incentive spirometer when instructed by your caregiver.  RISKS AND COMPLICATIONS  Take your time so you do not get dizzy or light-headed.  If you are in pain, you may need to take or ask for pain medication before doing incentive spirometry. It is harder to take a deep breath if you are having pain. AFTER USE  Rest and breathe slowly and easily.  It can be helpful to keep track of a log of your progress. Your caregiver can provide you with a simple table to help with this. If you are using the spirometer at home, follow these instructions: [place] IF:   You are having difficultly using the spirometer.  You have trouble using the spirometer as often as instructed.  Your pain medication is not giving enough relief while using the spirometer.  You develop fever of 100.5 F (38.1 C) or higher. SEEK IMMEDIATE MEDICAL CARE IF:   You cough up bloody sputum that had not been present before.  You develop fever of 102 F (38.9 C) or greater.  You develop worsening pain at or near the incision site. MAKE SURE YOU:   Understand these instructions.  Will watch your condition.  Will get help right away if you are not doing well or get worse. Document Released: 06/17/2006 Document Revised: 04/29/2011 Document Reviewed: 08/18/2006 [facility] Patient Information 2014 [place], [place].   ________________________________________________________________________

## 2019-04-29 ENCOUNTER — Other Ambulatory Visit: Payer: Self-pay

## 2019-04-29 ENCOUNTER — Encounter (HOSPITAL_COMMUNITY)
Admission: RE | Admit: 2019-04-29 | Discharge: 2019-04-29 | Disposition: A | Discharge status: Home / Self Care | Payer: PPO | Source: Ambulatory Visit | Attending: Orthopedic Surgery | Admitting: Orthopedic Surgery

## 2019-04-29 ENCOUNTER — Encounter (HOSPITAL_COMMUNITY): Payer: Self-pay

## 2019-04-29 DIAGNOSIS — Z01812 Encounter for preprocedural laboratory examination: Secondary | ICD-10-CM | POA: Insufficient documentation

## 2019-04-29 HISTORY — DX: Essential (primary) hypertension: I10

## 2019-04-29 HISTORY — DX: Pneumonia, unspecified organism: J18.9

## 2019-04-29 HISTORY — DX: Unspecified osteoarthritis, unspecified site: M19.90

## 2019-04-29 HISTORY — DX: Sleep apnea, unspecified: G47.30

## 2019-04-29 LAB — COMPREHENSIVE METABOLIC PANEL
ALT: 19 U/L (ref 0–44)
AST: 17 U/L (ref 15–41)
Albumin: 4.3 g/dL (ref 3.5–5.0)
Alkaline Phosphatase: 44 U/L (ref 38–126)
Anion gap: 10 (ref 5–15)
BUN: 19 mg/dL (ref 8–23)
CO2: 24 mmol/L (ref 22–32)
Calcium: 9.3 mg/dL (ref 8.9–10.3)
Chloride: 107 mmol/L (ref 98–111)
Creatinine, Ser: 0.65 mg/dL (ref 0.61–1.24)
GFR calc Af Amer: >60 mL/min (ref >60)
GFR calc non Af Amer: >60 mL/min (ref >60)
Glucose, Bld: 113 mg/dL — ABNORMAL HIGH (ref 70–99)
Potassium: 3.7 mmol/L (ref 3.5–5.1)
Sodium: 141 mmol/L (ref 135–145)
Total Bilirubin: 1 mg/dL (ref 0.3–1.2)
Total Protein: 7.2 g/dL (ref 6.5–8.1)

## 2019-04-29 LAB — SURGICAL PCR SCREEN
MRSA, PCR: NEGATIVE
Staphylococcus aureus: NEGATIVE

## 2019-04-29 LAB — CBC WITH DIFFERENTIAL/PLATELET
Abs Immature Granulocytes: 0.01 10*3/uL (ref 0.00–0.07)
Basophils Absolute: 0 10*3/uL (ref 0.0–0.1)
Basophils Relative: 1 %
Eosinophils Absolute: 0.1 10*3/uL (ref 0.0–0.5)
Eosinophils Relative: 2 %
HCT: 46.3 % (ref 39.0–52.0)
Hemoglobin: 15.2 g/dL (ref 13.0–17.0)
Immature Granulocytes: 0 %
Lymphocytes Relative: 22 %
Lymphs Abs: 1.6 10*3/uL (ref 0.7–4.0)
MCH: 30.6 pg (ref 26.0–34.0)
MCHC: 32.8 g/dL (ref 30.0–36.0)
MCV: 93.2 fL (ref 80.0–100.0)
Monocytes Absolute: 0.5 10*3/uL (ref 0.1–1.0)
Monocytes Relative: 7 %
Neutro Abs: 4.8 10*3/uL (ref 1.7–7.7)
Neutrophils Relative %: 68 %
Platelets: 156 10*3/uL (ref 150–400)
RBC: 4.97 MIL/uL (ref 4.22–5.81)
RDW: 13 % (ref 11.5–15.5)
WBC: 7 10*3/uL (ref 4.0–10.5)
nRBC: 0 % (ref 0.0–0.2)

## 2019-04-29 LAB — PROTIME-INR
INR: 1.1 (ref 0.8–1.2)
Prothrombin Time: 13.6 seconds (ref 11.4–15.2)

## 2019-04-29 LAB — APTT: aPTT: 34 seconds (ref 24–36)

## 2019-04-29 LAB — ABO/RH: ABO/RH(D): A NEG

## 2019-04-29 NOTE — Progress Notes (Signed)
PCP - DR. Delice Lesch: 03/16/19. epic Cardiologist -   Chest x-ray -  EKG -  Stress Test -  ECHO -  Cardiac Cath -   Sleep Study -  CPAP -   Fasting Blood Sugar -  Checks Blood Sugar _____ times a day  Blood Thinner Instructions: Aspirin Instructions: Last Dose: WILL STOP ON 04/30/19  Anesthesia review:   Patient denies shortness of breath, fever, cough and chest pain at PAT appointment   Patient verbalized understanding of instructions that were given to them at the PAT appointment. Patient was also instructed that they will need to review over the PAT instructions again at home before surgery.

## 2019-05-04 ENCOUNTER — Other Ambulatory Visit (HOSPITAL_COMMUNITY)
Admission: RE | Admit: 2019-05-04 | Discharge: 2019-05-04 | Disposition: A | Discharge status: Home / Self Care | Payer: PPO | Source: Ambulatory Visit | Attending: Orthopedic Surgery | Admitting: Orthopedic Surgery

## 2019-05-04 DIAGNOSIS — Z01812 Encounter for preprocedural laboratory examination: Secondary | ICD-10-CM | POA: Diagnosis not present

## 2019-05-04 DIAGNOSIS — Z20822 Contact with and (suspected) exposure to covid-19: Secondary | ICD-10-CM | POA: Diagnosis not present

## 2019-05-04 LAB — SARS CORONAVIRUS 2 (TAT 6-24 HRS): SARS Coronavirus 2: NEGATIVE

## 2019-05-06 MED ORDER — TRANEXAMIC ACID 1000 MG/10ML IV SOLN
2000.0000 mg | INTRAVENOUS | Status: DC
  Filled 2019-05-06 (×2): qty 20

## 2019-05-06 MED ORDER — BUPIVACAINE LIPOSOME 1.3 % IJ SUSP
10.0000 mL | Freq: Once | INTRAMUSCULAR | Status: DC
  Filled 2019-05-06: qty 10

## 2019-05-06 NOTE — Anesthesia Preprocedure Evaluation (Addendum)
Anesthesia Evaluation  Patient identified by MRN, date of birth, ID band Patient awake    Reviewed: Allergy & Precautions, NPO status , Patient's Chart, lab work & pertinent test results  History of Anesthesia Complications (+) DIFFICULT AIRWAY  Airway Mallampati: II   Neck ROM: Full    Dental no notable dental hx. (+) Dental Advisory Given   Pulmonary sleep apnea , former smoker,    Pulmonary exam normal        Cardiovascular hypertension, Pt. on medications Normal cardiovascular exam     Neuro/Psych negative neurological ROS     GI/Hepatic negative GI ROS, Neg liver ROS,   Endo/Other  negative endocrine ROS  Renal/GU negative Renal ROS     Musculoskeletal negative musculoskeletal ROS (+)   Abdominal   Peds  Hematology negative hematology ROS (+)   Anesthesia Other Findings Day of surgery medications reviewed with the patient.  Reproductive/Obstetrics                            Anesthesia Physical Anesthesia Plan  ASA: II  Anesthesia Plan: Spinal   Post-op Pain Management:    Induction:   PONV Risk Score and Plan: 2 and Propofol infusion and Ondansetron  Airway Management Planned: Natural Airway  Additional Equipment:   Intra-op Plan:   Post-operative Plan:   Informed Consent: I have reviewed the patients History and Physical, chart, labs and discussed the procedure including the risks, benefits and alternatives for the proposed anesthesia with the patient or authorized representative who has indicated his/her understanding and acceptance.     Dental advisory given  Plan Discussed with: Anesthesiologist, Surgeon and CRNA  Anesthesia Plan Comments:        Anesthesia Quick Evaluation

## 2019-05-07 ENCOUNTER — Observation Stay (HOSPITAL_COMMUNITY)
Admission: RE | Admit: 2019-05-07 | Discharge: 2019-05-09 | Disposition: A | Discharge status: Home / Self Care | Payer: PPO | Attending: Orthopedic Surgery | Admitting: Orthopedic Surgery

## 2019-05-07 ENCOUNTER — Ambulatory Visit (HOSPITAL_COMMUNITY): Payer: PPO | Admitting: Certified Registered Nurse Anesthetist

## 2019-05-07 ENCOUNTER — Encounter (HOSPITAL_COMMUNITY)
Admission: RE | Disposition: A | Discharge status: Home / Self Care | Payer: Self-pay | Source: Home / Self Care | Attending: Orthopedic Surgery

## 2019-05-07 ENCOUNTER — Observation Stay (HOSPITAL_COMMUNITY): Payer: PPO

## 2019-05-07 ENCOUNTER — Other Ambulatory Visit: Payer: Self-pay

## 2019-05-07 ENCOUNTER — Encounter (HOSPITAL_COMMUNITY): Payer: Self-pay | Admitting: Orthopedic Surgery

## 2019-05-07 DIAGNOSIS — Z87891 Personal history of nicotine dependence: Secondary | ICD-10-CM | POA: Diagnosis not present

## 2019-05-07 DIAGNOSIS — Z791 Long term (current) use of non-steroidal anti-inflammatories (NSAID): Secondary | ICD-10-CM | POA: Insufficient documentation

## 2019-05-07 DIAGNOSIS — I10 Essential (primary) hypertension: Secondary | ICD-10-CM | POA: Diagnosis not present

## 2019-05-07 DIAGNOSIS — M1612 Unilateral primary osteoarthritis, left hip: Secondary | ICD-10-CM

## 2019-05-07 DIAGNOSIS — Z96641 Presence of right artificial hip joint: Secondary | ICD-10-CM | POA: Diagnosis not present

## 2019-05-07 DIAGNOSIS — J189 Pneumonia, unspecified organism: Secondary | ICD-10-CM | POA: Diagnosis not present

## 2019-05-07 DIAGNOSIS — M1611 Unilateral primary osteoarthritis, right hip: Secondary | ICD-10-CM | POA: Diagnosis not present

## 2019-05-07 DIAGNOSIS — G4733 Obstructive sleep apnea (adult) (pediatric): Secondary | ICD-10-CM | POA: Diagnosis not present

## 2019-05-07 DIAGNOSIS — Z471 Aftercare following joint replacement surgery: Secondary | ICD-10-CM | POA: Diagnosis not present

## 2019-05-07 DIAGNOSIS — Z79899 Other long term (current) drug therapy: Secondary | ICD-10-CM | POA: Insufficient documentation

## 2019-05-07 DIAGNOSIS — G4730 Sleep apnea, unspecified: Secondary | ICD-10-CM | POA: Diagnosis present

## 2019-05-07 HISTORY — PX: TOTAL HIP ARTHROPLASTY: SHX124

## 2019-05-07 LAB — TYPE AND SCREEN
ABO/RH(D): A NEG
Antibody Screen: NEGATIVE

## 2019-05-07 SURGERY — ARTHROPLASTY, HIP, TOTAL,POSTERIOR APPROACH
Anesthesia: Spinal | Site: Hip | Laterality: Right

## 2019-05-07 MED ORDER — POVIDONE-IODINE 10 % EX SWAB: 2.0000 "application " | Freq: Once | CUTANEOUS | Status: DC

## 2019-05-07 MED ORDER — ACETAMINOPHEN 325 MG PO TABS
325.0000 mg | ORAL_TABLET | Freq: Four times a day (QID) | ORAL | Status: DC | PRN
  Administered 2019-05-08 – 2019-05-09 (×2): 650 mg via ORAL
  Filled 2019-05-07 (×2): qty 2

## 2019-05-07 MED ORDER — ACETAMINOPHEN 500 MG PO TABS
ORAL_TABLET | ORAL | Status: AC
  Filled 2019-05-07: qty 2

## 2019-05-07 MED ORDER — ASPIRIN 81 MG PO CHEW
81.0000 mg | CHEWABLE_TABLET | Freq: Two times a day (BID) | ORAL | Status: DC
  Administered 2019-05-07 – 2019-05-09 (×4): 81 mg via ORAL
  Filled 2019-05-07 (×4): qty 1

## 2019-05-07 MED ORDER — CEFAZOLIN SODIUM-DEXTROSE 2-4 GM/100ML-% IV SOLN
INTRAVENOUS | Status: AC
  Filled 2019-05-07: qty 100

## 2019-05-07 MED ORDER — ACETAMINOPHEN 500 MG PO TABS
1000.0000 mg | ORAL_TABLET | Freq: Once | ORAL | Status: AC
  Administered 2019-05-07: 1000 mg via ORAL

## 2019-05-07 MED ORDER — DOCUSATE SODIUM 100 MG PO CAPS
100.0000 mg | ORAL_CAPSULE | Freq: Two times a day (BID) | ORAL | Status: DC
  Administered 2019-05-07 – 2019-05-09 (×4): 100 mg via ORAL
  Filled 2019-05-07 (×4): qty 1

## 2019-05-07 MED ORDER — CHLORHEXIDINE GLUCONATE CLOTH 2 % EX PADS
6.0000 | MEDICATED_PAD | Freq: Every day | CUTANEOUS | Status: DC
  Administered 2019-05-08 – 2019-05-09 (×2): 6 via TOPICAL

## 2019-05-07 MED ORDER — FENTANYL CITRATE (PF) 100 MCG/2ML IJ SOLN
INTRAMUSCULAR | Status: AC
  Filled 2019-05-07: qty 2

## 2019-05-07 MED ORDER — HYDROMORPHONE HCL 1 MG/ML IJ SOLN: 0.5000 mg | INTRAMUSCULAR | Status: DC | PRN

## 2019-05-07 MED ORDER — FENTANYL CITRATE (PF) 100 MCG/2ML IJ SOLN
INTRAMUSCULAR | Status: DC | PRN
  Administered 2019-05-07 (×2): 25 ug via INTRAVENOUS
  Administered 2019-05-07: 50 ug via INTRAVENOUS

## 2019-05-07 MED ORDER — CELECOXIB 200 MG PO CAPS
ORAL_CAPSULE | ORAL | Status: AC
  Administered 2019-05-07: 200 mg via ORAL
  Filled 2019-05-07: qty 2

## 2019-05-07 MED ORDER — BUPIVACAINE LIPOSOME 1.3 % IJ SUSP
INTRAMUSCULAR | Status: DC | PRN
  Administered 2019-05-07: 10 mL

## 2019-05-07 MED ORDER — SORBITOL 70 % SOLN
30.0000 mL | Freq: Every day | Status: DC | PRN
  Filled 2019-05-07: qty 30

## 2019-05-07 MED ORDER — PHENYLEPHRINE 40 MCG/ML (10ML) SYRINGE FOR IV PUSH (FOR BLOOD PRESSURE SUPPORT)
PREFILLED_SYRINGE | INTRAVENOUS | Status: AC
  Filled 2019-05-07: qty 10

## 2019-05-07 MED ORDER — ACETAMINOPHEN 500 MG PO TABS
1000.0000 mg | ORAL_TABLET | Freq: Four times a day (QID) | ORAL | Status: AC
  Administered 2019-05-08 (×2): 1000 mg via ORAL
  Filled 2019-05-07 (×2): qty 2

## 2019-05-07 MED ORDER — SODIUM CHLORIDE 0.9 % IR SOLN
Status: DC | PRN
  Administered 2019-05-07: 1000 mL

## 2019-05-07 MED ORDER — CHLORHEXIDINE GLUCONATE 4 % EX LIQD: 60.0000 mL | Freq: Once | CUTANEOUS | Status: DC

## 2019-05-07 MED ORDER — ONDANSETRON HCL 4 MG PO TABS: 4.0000 mg | ORAL_TABLET | Freq: Four times a day (QID) | ORAL | Status: DC | PRN

## 2019-05-07 MED ORDER — METOCLOPRAMIDE HCL 5 MG/ML IJ SOLN: 5.0000 mg | Freq: Three times a day (TID) | INTRAMUSCULAR | Status: DC | PRN

## 2019-05-07 MED ORDER — CEFAZOLIN SODIUM-DEXTROSE 2-4 GM/100ML-% IV SOLN
2.0000 g | Freq: Four times a day (QID) | INTRAVENOUS | Status: AC
  Administered 2019-05-07 (×2): 2 g via INTRAVENOUS
  Filled 2019-05-07 (×2): qty 100

## 2019-05-07 MED ORDER — CELECOXIB 200 MG PO CAPS: 200.0000 mg | ORAL_CAPSULE | Freq: Once | ORAL | Status: AC

## 2019-05-07 MED ORDER — SODIUM CHLORIDE 0.9 % IV SOLN: INTRAVENOUS | Status: DC

## 2019-05-07 MED ORDER — FENTANYL CITRATE (PF) 100 MCG/2ML IJ SOLN: 25.0000 ug | INTRAMUSCULAR | Status: DC | PRN

## 2019-05-07 MED ORDER — OXYCODONE HCL 5 MG PO TABS: ORAL_TABLET | ORAL | 0 refills | Status: DC

## 2019-05-07 MED ORDER — DIPHENHYDRAMINE HCL 12.5 MG/5ML PO ELIX: 12.5000 mg | ORAL_SOLUTION | ORAL | Status: DC | PRN

## 2019-05-07 MED ORDER — CELECOXIB 200 MG PO CAPS
200.0000 mg | ORAL_CAPSULE | Freq: Two times a day (BID) | ORAL | Status: DC
  Administered 2019-05-07 – 2019-05-09 (×4): 200 mg via ORAL
  Filled 2019-05-07 (×4): qty 1

## 2019-05-07 MED ORDER — ONDANSETRON HCL 4 MG/2ML IJ SOLN
INTRAMUSCULAR | Status: DC | PRN
  Administered 2019-05-07: 4 mg via INTRAVENOUS

## 2019-05-07 MED ORDER — ONDANSETRON HCL 4 MG/2ML IJ SOLN
INTRAMUSCULAR | Status: AC
  Filled 2019-05-07: qty 2

## 2019-05-07 MED ORDER — TRANEXAMIC ACID 1000 MG/10ML IV SOLN
INTRAVENOUS | Status: DC | PRN
  Administered 2019-05-07: 2000 mg via TOPICAL

## 2019-05-07 MED ORDER — HYDROCHLOROTHIAZIDE 25 MG PO TABS
25.0000 mg | ORAL_TABLET | Freq: Every day | ORAL | Status: DC
  Administered 2019-05-08 – 2019-05-09 (×2): 25 mg via ORAL
  Filled 2019-05-07 (×2): qty 1

## 2019-05-07 MED ORDER — DEXAMETHASONE SODIUM PHOSPHATE 10 MG/ML IJ SOLN
INTRAMUSCULAR | Status: AC
  Filled 2019-05-07: qty 1

## 2019-05-07 MED ORDER — TRANEXAMIC ACID-NACL 1000-0.7 MG/100ML-% IV SOLN
INTRAVENOUS | Status: AC
  Filled 2019-05-07: qty 100

## 2019-05-07 MED ORDER — ONDANSETRON HCL 4 MG/2ML IJ SOLN: 4.0000 mg | Freq: Four times a day (QID) | INTRAMUSCULAR | Status: DC | PRN

## 2019-05-07 MED ORDER — LACTATED RINGERS IV SOLN: INTRAVENOUS | Status: DC

## 2019-05-07 MED ORDER — PHENYLEPHRINE 40 MCG/ML (10ML) SYRINGE FOR IV PUSH (FOR BLOOD PRESSURE SUPPORT)
PREFILLED_SYRINGE | INTRAVENOUS | Status: DC | PRN
  Administered 2019-05-07: 80 ug via INTRAVENOUS

## 2019-05-07 MED ORDER — OXYCODONE HCL 5 MG PO TABS
5.0000 mg | ORAL_TABLET | ORAL | Status: DC | PRN
  Administered 2019-05-07 – 2019-05-08 (×3): 10 mg via ORAL
  Filled 2019-05-07 (×3): qty 2

## 2019-05-07 MED ORDER — SENNOSIDES-DOCUSATE SODIUM 8.6-50 MG PO TABS: 1.0000 | ORAL_TABLET | Freq: Every evening | ORAL | Status: DC | PRN

## 2019-05-07 MED ORDER — PROPOFOL 10 MG/ML IV BOLUS
INTRAVENOUS | Status: DC | PRN
  Administered 2019-05-07: 20 mg via INTRAVENOUS
  Administered 2019-05-07: 30 mg via INTRAVENOUS

## 2019-05-07 MED ORDER — LISINOPRIL 20 MG PO TABS
40.0000 mg | ORAL_TABLET | Freq: Every day | ORAL | Status: DC
  Administered 2019-05-09: 40 mg via ORAL
  Filled 2019-05-07 (×2): qty 2

## 2019-05-07 MED ORDER — FLEET ENEMA 7-19 GM/118ML RE ENEM: 1.0000 | ENEMA | Freq: Once | RECTAL | Status: DC | PRN

## 2019-05-07 MED ORDER — SODIUM CHLORIDE (PF) 0.9 % IJ SOLN
INTRAMUSCULAR | Status: DC | PRN
  Administered 2019-05-07: 20 mL

## 2019-05-07 MED ORDER — BUPIVACAINE HCL (PF) 0.25 % IJ SOLN
INTRAMUSCULAR | Status: DC | PRN
  Administered 2019-05-07: 20 mL

## 2019-05-07 MED ORDER — ROSUVASTATIN CALCIUM 5 MG PO TABS
5.0000 mg | ORAL_TABLET | Freq: Every day | ORAL | Status: DC
  Administered 2019-05-07 – 2019-05-09 (×3): 5 mg via ORAL
  Filled 2019-05-07 (×3): qty 1

## 2019-05-07 MED ORDER — EPHEDRINE 5 MG/ML INJ
INTRAVENOUS | Status: AC
  Filled 2019-05-07: qty 10

## 2019-05-07 MED ORDER — MENTHOL 3 MG MT LOZG: 1.0000 | LOZENGE | OROMUCOSAL | Status: DC | PRN

## 2019-05-07 MED ORDER — TRANEXAMIC ACID-NACL 1000-0.7 MG/100ML-% IV SOLN
1000.0000 mg | INTRAVENOUS | Status: AC
  Administered 2019-05-07: 1000 mg via INTRAVENOUS

## 2019-05-07 MED ORDER — PROPOFOL 10 MG/ML IV BOLUS
INTRAVENOUS | Status: AC
  Filled 2019-05-07: qty 40

## 2019-05-07 MED ORDER — PHENYLEPHRINE HCL-NACL 10-0.9 MG/250ML-% IV SOLN
INTRAVENOUS | Status: DC | PRN
  Administered 2019-05-07: 40 ug/min via INTRAVENOUS

## 2019-05-07 MED ORDER — BUPIVACAINE HCL 0.25 % IJ SOLN
INTRAMUSCULAR | Status: AC
  Filled 2019-05-07: qty 1

## 2019-05-07 MED ORDER — GABAPENTIN 300 MG PO CAPS
300.0000 mg | ORAL_CAPSULE | Freq: Three times a day (TID) | ORAL | Status: DC
  Administered 2019-05-07 – 2019-05-09 (×6): 300 mg via ORAL
  Filled 2019-05-07 (×6): qty 1

## 2019-05-07 MED ORDER — CELECOXIB 200 MG PO CAPS: 200.0000 mg | ORAL_CAPSULE | Freq: Two times a day (BID) | ORAL | 1 refills | Status: DC

## 2019-05-07 MED ORDER — PROMETHAZINE HCL 25 MG/ML IJ SOLN: 6.2500 mg | INTRAMUSCULAR | Status: DC | PRN

## 2019-05-07 MED ORDER — BUPIVACAINE IN DEXTROSE 0.75-8.25 % IT SOLN
INTRATHECAL | Status: DC | PRN
  Administered 2019-05-07: 2 mL via INTRATHECAL

## 2019-05-07 MED ORDER — CEFAZOLIN SODIUM-DEXTROSE 2-4 GM/100ML-% IV SOLN
2.0000 g | INTRAVENOUS | Status: AC
  Administered 2019-05-07: 2 g via INTRAVENOUS

## 2019-05-07 MED ORDER — TRANEXAMIC ACID-NACL 1000-0.7 MG/100ML-% IV SOLN
1000.0000 mg | Freq: Once | INTRAVENOUS | Status: AC
  Administered 2019-05-07: 1000 mg via INTRAVENOUS
  Filled 2019-05-07: qty 100

## 2019-05-07 MED ORDER — EPHEDRINE SULFATE-NACL 50-0.9 MG/10ML-% IV SOSY
PREFILLED_SYRINGE | INTRAVENOUS | Status: DC | PRN
  Administered 2019-05-07 (×4): 10 mg via INTRAVENOUS

## 2019-05-07 MED ORDER — SODIUM CHLORIDE (PF) 0.9 % IJ SOLN
INTRAMUSCULAR | Status: AC
  Filled 2019-05-07: qty 20

## 2019-05-07 MED ORDER — GABAPENTIN 300 MG PO CAPS: ORAL_CAPSULE | ORAL | 0 refills | Status: DC

## 2019-05-07 MED ORDER — ALBUMIN HUMAN 5 % IV SOLN: INTRAVENOUS | Status: DC | PRN

## 2019-05-07 MED ORDER — METOCLOPRAMIDE HCL 5 MG PO TABS: 5.0000 mg | ORAL_TABLET | Freq: Three times a day (TID) | ORAL | Status: DC | PRN

## 2019-05-07 MED ORDER — ASPIRIN EC 81 MG PO TBEC: 81.0000 mg | DELAYED_RELEASE_TABLET | Freq: Two times a day (BID) | ORAL | 0 refills | Status: AC

## 2019-05-07 MED ORDER — PHENOL 1.4 % MT LIQD: 1.0000 | OROMUCOSAL | Status: DC | PRN

## 2019-05-07 MED ORDER — PROPOFOL 500 MG/50ML IV EMUL
INTRAVENOUS | Status: DC | PRN
  Administered 2019-05-07: 50 ug/kg/min via INTRAVENOUS

## 2019-05-07 MED ORDER — WATER FOR IRRIGATION, STERILE IR SOLN
Status: DC | PRN
  Administered 2019-05-07: 2000 mL

## 2019-05-07 SURGICAL SUPPLY — 55 items
BAG DECANTER FOR FLEXI CONT (MISCELLANEOUS) ×3 IMPLANT
BAG ZIPLOCK 12X15 (MISCELLANEOUS) ×3 IMPLANT
BLADE SAW SAG 73X25 THK (BLADE) ×1
BLADE SAW SGTL 73X25 THK (BLADE) ×2 IMPLANT
CLOSURE STERI-STRIP 1/2X4 (GAUZE/BANDAGES/DRESSINGS) ×1
CLSR STERI-STRIP ANTIMIC 1/2X4 (GAUZE/BANDAGES/DRESSINGS) ×2 IMPLANT
COVER SURGICAL LIGHT HANDLE (MISCELLANEOUS) ×3 IMPLANT
COVER WAND RF STERILE (DRAPES) IMPLANT
CUP ACET PNNCL SECTR W/GRIP 56 (Hips) IMPLANT
DECANTER SPIKE VIAL GLASS SM (MISCELLANEOUS) ×9 IMPLANT
DRAPE 3/4 80X56 (DRAPES) ×3 IMPLANT
DRAPE INCISE IOBAN 66X45 STRL (DRAPES) ×3 IMPLANT
DRAPE ORTHO SPLIT 77X108 STRL (DRAPES) ×4
DRAPE POUCH INSTRU U-SHP 10X18 (DRAPES) ×3 IMPLANT
DRAPE SURG ORHT 6 SPLT 77X108 (DRAPES) ×2 IMPLANT
DRAPE U-SHAPE 47X51 STRL (DRAPES) ×3 IMPLANT
DRESSING AQUACEL AG SP 3.5X10 (GAUZE/BANDAGES/DRESSINGS) ×1 IMPLANT
DRSG AQUACEL AG SP 3.5X10 (GAUZE/BANDAGES/DRESSINGS) ×3
DURAPREP 26ML APPLICATOR (WOUND CARE) ×3 IMPLANT
ELECT BLADE TIP CTD 4 INCH (ELECTRODE) ×3 IMPLANT
ELECT REM PT RETURN 15FT ADLT (MISCELLANEOUS) ×3 IMPLANT
FACESHIELD WRAPAROUND (MASK) ×9 IMPLANT
FACESHIELD WRAPAROUND OR TEAM (MASK) ×3 IMPLANT
GLOVE BIOGEL PI IND STRL 8 (GLOVE) ×2 IMPLANT
GLOVE BIOGEL PI INDICATOR 8 (GLOVE) ×4
GLOVE SURG ORTHO 8.0 STRL STRW (GLOVE) ×3 IMPLANT
GLOVE SURG SS PI 7.5 STRL IVOR (GLOVE) ×3 IMPLANT
GOWN STRL REUS W/TWL XL LVL3 (GOWN DISPOSABLE) ×6 IMPLANT
HEAD CERAMIC 36 PLUS 8.5 12 14 (Hips) ×2 IMPLANT
HOLDER FOLEY CATH W/STRAP (MISCELLANEOUS) ×3 IMPLANT
HOOD PEEL AWAY FLYTE STAYCOOL (MISCELLANEOUS) ×3 IMPLANT
KIT BASIN OR (CUSTOM PROCEDURE TRAY) ×3 IMPLANT
KIT TURNOVER KIT A (KITS) IMPLANT
LINER NEUTRAL 52MMX36MMX56 (Hips) ×2 IMPLANT
MANIFOLD NEPTUNE II (INSTRUMENTS) ×3 IMPLANT
NEEDLE HYPO 22GX1.5 SAFETY (NEEDLE) ×6 IMPLANT
NS IRRIG 1000ML POUR BTL (IV SOLUTION) ×3 IMPLANT
PACK TOTAL JOINT (CUSTOM PROCEDURE TRAY) ×3 IMPLANT
PENCIL SMOKE EVACUATOR (MISCELLANEOUS) ×2 IMPLANT
PINN SECTOR W/GRIP ACE CUP 56 (Hips) ×3 IMPLANT
PROTECTOR NERVE ULNAR (MISCELLANEOUS) ×3 IMPLANT
STAPLER VISISTAT 35W (STAPLE) IMPLANT
STEM FEM CMNTLSS SM AML 15.0 (Hips) ×2 IMPLANT
SUCTION FRAZIER HANDLE 12FR (TUBING) ×2
SUCTION TUBE FRAZIER 12FR DISP (TUBING) ×1 IMPLANT
SUT ETHIBOND NAB CT1 #1 30IN (SUTURE) ×12 IMPLANT
SUT MNCRL AB 3-0 PS2 18 (SUTURE) ×3 IMPLANT
SUT VIC AB 0 CT1 36 (SUTURE) ×6 IMPLANT
SUT VIC AB 2-0 CT1 27 (SUTURE) ×4
SUT VIC AB 2-0 CT1 TAPERPNT 27 (SUTURE) ×2 IMPLANT
SYR CONTROL 10ML LL (SYRINGE) ×6 IMPLANT
TOWEL OR 17X26 10 PK STRL BLUE (TOWEL DISPOSABLE) ×3 IMPLANT
TRAY FOLEY MTR SLVR 16FR STAT (SET/KITS/TRAYS/PACK) ×3 IMPLANT
WATER STERILE IRR 1000ML POUR (IV SOLUTION) ×6 IMPLANT
YANKAUER SUCT BULB TIP 10FT TU (MISCELLANEOUS) ×3 IMPLANT

## 2019-05-07 NOTE — Transfer of Care (Signed)
Immediate Anesthesia Transfer of Care Note  Patient: Ralph Davis  Procedure(s) Performed: TOTAL HIP ARTHROPLASTY (Right Hip)  Patient Location: PACU  Anesthesia Type:MAC and Spinal  Level of Consciousness: awake, alert , oriented and patient cooperative  Airway & Oxygen Therapy: Patient Spontanous Breathing and Patient connected to face mask oxygen  Post-op Assessment: Report given to RN and Post -op Vital signs reviewed and stable  Post vital signs: Reviewed and stable  Last Vitals:  Vitals Value Taken Time  BP 92/45 05/07/19 1001  Temp    Pulse 83 05/07/19 1003  Resp 11 05/07/19 1003  SpO2 98 % 05/07/19 1003  Vitals shown include unvalidated device data.  Last Pain:  Vitals:   05/07/19 0616  TempSrc: Oral         Complications: No apparent anesthesia complications

## 2019-05-07 NOTE — Discharge Summary (Addendum)
PATIENT ID: Ralph Davis        MRN:  [id number]          DOB/AGE: Sep 05, 1945 / 74 y.o.    DISCHARGE SUMMARY  ADMISSION DATE:    05/07/2019 DISCHARGE DATE:   05/09/2019  ADMISSION DIAGNOSIS: Degenerative joint disease of the right hip  DISCHARGE DIAGNOSIS: Degenerative joint disease of right hip  ADDITIONAL DIAGNOSIS: Principal Problem:   Degenerative joint disease of right hip Active Problems:   Sleep apnea   Hypertension  Past Medical History:  Diagnosis Date  . Arthritis   . Hypertension   . Pneumonia   . Sleep apnea     PROCEDURE: Procedure(s): TOTAL HIP ARTHROPLASTY Right on 05/07/2019  CONSULTS: PT/OT    HISTORY:  See H&P in chart  HOSPITAL COURSE:  Ralph Davis is a 74 y.o. admitted on 05/07/2019 and found to have a diagnosis of Right hip DJD  After appropriate laboratory studies were obtained  they were taken to the operating room on 05/07/2019 and underwent  Procedure(s): TOTAL HIP ARTHROPLASTY  Right.   They were given perioperative antibiotics:  Anti-infectives (From admission, onward)   Start     Dose/Rate Route Frequency Ordered Stop   05/07/19 1400  ceFAZolin (ANCEF) IVPB 2g/100 mL premix     2 g 200 mL/hr over 30 Minutes Intravenous Every 6 hours 05/07/19 1221 05/07/19 2156   05/07/19 0600  ceFAZolin (ANCEF) IVPB 2g/100 mL premix     2 g 200 mL/hr over 30 Minutes Intravenous On call to O.R. 05/07/19 [id number] 05/07/19 [id number]    .  Tolerated the procedure well.  Placed with a foley intraoperatively.    POD #1, allowed out of bed to a chair.  PT for ambulation and exercise program.  Foley D/C'd in morning.  IV saline locked.  O2 discontionued.  The remainder of the hospital course was dedicated to ambulation and strengthening.   The patient was discharged on two days post op in  Stable condition.  Blood products given:none  DIAGNOSTIC STUDIES: Recent vital signs:  No data found.     Recent laboratory studies: No results for input(s): WBC,  HGB, HCT, PLT in the last 168 hours. No results for input(s): NA, K, CL, CO2, BUN, CREATININE, GLUCOSE, CALCIUM in the last 168 hours. Lab Results  Component Value Date   INR 1.1 04/29/2019     Recent Radiographic Studies :  DG Pelvis Portable  Result Date: 05/07/2019 CLINICAL DATA:  Postop RIGHT hip surgery EXAM: PORTABLE PELVIS 1-2 VIEWS COMPARISON:  Portable exam 1033 hours without priors for comparison FINDINGS: RIGHT hip prosthesis identified. Tip of femoral component not imaged. No acute fracture or dislocation. Bones appear demineralized. Spur formation at pubic symphysis. IMPRESSION: RIGHT hip prosthesis without acute abnormalities. Electronically Signed   By: Lavonia Dana M.D.   On: 05/07/2019 10:49   DG Hip Port Unilat With Pelvis 1V Left  Result Date: 05/07/2019 CLINICAL DATA:  Post RIGHT hip surgery EXAM: DG HIP (WITH OR WITHOUT PELVIS) 1V PORT LEFT COMPARISON:  Portable exam 1036 hours without priors for comparison. FINDINGS: RIGHT hip prosthesis identified. Osseous demineralization. No fracture or dislocation identified on single AP view. IMPRESSION: RIGHT hip prosthesis without acute abnormalities. Electronically Signed   By: Lavonia Dana M.D.   On: 05/07/2019 10:51    DISCHARGE INSTRUCTIONS:   DISCHARGE MEDICATIONS:   Allergies as of 05/09/2019   No Known Allergies     Medication List    TAKE these medications  acetaminophen 650 MG CR tablet Commonly known as: TYLENOL Take 1,300 mg by mouth 2 (two) times daily as needed for pain.   aspirin EC 81 MG tablet Take 1 tablet (81 mg total) by mouth in the morning and at bedtime. What changed: when to take this   celecoxib 200 MG capsule Commonly known as: CELEBREX Take 1 capsule (200 mg total) by mouth 2 (two) times daily.   CLEAR EYES OP Place 2 drops into both eyes daily as needed (dry eyes).   Coenzyme Q10 300 MG Caps Take 300 mg by mouth daily.   gabapentin 300 MG capsule Commonly known as: Neurontin Take 1  capsule (300 mg total) by mouth 3 (three) times daily for 14 days, THEN 1 capsule (300 mg total) 2 (two) times daily for 3 days, THEN 1 capsule (300 mg total) daily for 4 days. Start taking on: May 07, 2019   Glucosamine Chondr 1500 Complx Caps Take 1 capsule by mouth daily.   hydrochlorothiazide 25 MG tablet Commonly known as: HYDRODIURIL Take 25 mg by mouth daily.   lisinopril 40 MG tablet Commonly known as: ZESTRIL Take 40 mg by mouth daily.   multivitamin with minerals Tabs tablet Take 1 tablet by mouth daily.   Omega-3 1400 MG Caps Take 1,400 mg by mouth daily.   oxyCODONE 5 MG immediate release tablet Commonly known as: Oxy IR/ROXICODONE Take one tab po q4-6hrs prn pain, may need 1-2 first couple weeks   Probiotic Caps Take 2 capsules by mouth daily.   rosuvastatin 5 MG tablet Commonly known as: CRESTOR Take 5 mg by mouth daily.   TURMERIC PO Take 2,000 mg by mouth daily.       FOLLOW UP VISIT:   Follow-up Information    Earlie Server, MD. Schedule an appointment as soon as possible for a visit in 2 weeks.   Specialty: Orthopedic Surgery Contact information: [place] [address] [phone]           DISPOSITION:   Home  CONDITION:  Stable   Chriss Czar, PA-C  05/26/2019 6:25 PM

## 2019-05-07 NOTE — Discharge Instructions (Signed)

## 2019-05-07 NOTE — Interval H&P Note (Signed)
History and Physical Interval Note:  05/07/2019 7:31 AM  Ralph Davis  has presented today for surgery, with the diagnosis of * No pre-op diagnosis entered *.  The various methods of treatment have been discussed with the patient and family. After consideration of risks, benefits and other options for treatment, the patient has consented to  Procedure(s): TOTAL HIP ARTHROPLASTY (Right) as a surgical intervention.  The patient's history has been reviewed, patient examined, no change in status, stable for surgery.  I have reviewed the patient's chart and labs.  Questions were answered to the patient's satisfaction.     Yvette Rack

## 2019-05-07 NOTE — Brief Op Note (Signed)
05/07/2019  10:06 AM  PATIENT:  Ralph Davis  74 y.o. male  PRE-OPERATIVE DIAGNOSIS:  * No pre-op diagnosis entered *  POST-OPERATIVE DIAGNOSIS:  OSTEOARTHRITIS  RIGHT HIP  PROCEDURE:  Procedure(s): TOTAL HIP ARTHROPLASTY (Right)  SURGEON:  Surgeon(s) and Role:    Earlie Server, MD - Primary  PHYSICIAN ASSISTANT: Chriss Czar, PA-C  ASSISTANTS: OR staff x1   ANESTHESIA:   local, spinal and IV sedation  EBL:  350 mL   BLOOD ADMINISTERED:none  DRAINS: none   LOCAL MEDICATIONS USED:  MARCAINE     SPECIMEN:  No Specimen  DISPOSITION OF SPECIMEN:  N/A  COUNTS:  YES  TOURNIQUET:  * No tourniquets in log *  DICTATION: .Other Dictation: Dictation Number unknown  PLAN OF CARE: Admit for overnight observation  PATIENT DISPOSITION:  PACU - hemodynamically stable.   Delay start of Pharmacological VTE agent (>24hrs) due to surgical blood loss or risk of bleeding: yes

## 2019-05-07 NOTE — Anesthesia Procedure Notes (Signed)
Procedure Name: MAC Date/Time: 05/07/2019 7:36 AM Performed by: West Pugh, CRNA Pre-anesthesia Checklist: Patient identified, Emergency Drugs available, Suction available, Patient being monitored and Timeout performed Patient Re-evaluated:Patient Re-evaluated prior to induction Oxygen Delivery Method: Simple face mask Preoxygenation: Pre-oxygenation with 100% oxygen Induction Type: IV induction Placement Confirmation: positive ETCO2 Dental Injury: Teeth and Oropharynx as per pre-operative assessment

## 2019-05-07 NOTE — Anesthesia Postprocedure Evaluation (Signed)
Anesthesia Post Note  Patient: Ralph Davis  Procedure(s) Performed: TOTAL HIP ARTHROPLASTY (Right Hip)     Patient location during evaluation: PACU Anesthesia Type: Spinal Level of consciousness: awake and alert Pain management: pain level controlled Vital Signs Assessment: post-procedure vital signs reviewed and stable Respiratory status: spontaneous breathing and respiratory function stable Cardiovascular status: blood pressure returned to baseline and stable Postop Assessment: spinal receding Anesthetic complications: no    Last Vitals:  Vitals:   05/07/19 1200 05/07/19 1214  BP: (!) 101/51 (!) 113/51  Pulse: 73 77  Resp: 16   Temp: (!) 36.4 C 36.7 C  SpO2: 98% 100%    Last Pain:  Vitals:   05/07/19 0616  TempSrc: Oral                 Aeric Burnham DANIEL

## 2019-05-07 NOTE — Evaluation (Signed)
Physical Therapy Evaluation Patient Details Name: Ralph Davis MRN: [id number] DOB: 1945/07/30 Today's Date: 05/07/2019   History of Present Illness  Pt s/p R THR and with hx of L TKR   Clinical Impression  Pt s/p R THR and presents with decreased  R LE strength/ROM, post op pain and posterior THP limiting functional mobility.  Pt should progress to dc home with family assist.  Pt states he is set to start OP PT 05/10/19.    Follow Up Recommendations Home health PT;Follow surgeon's recommendation for DC plan and follow-up therapies    Equipment Recommendations  Rolling walker with 5" wheels    Recommendations for Other Services OT consult     Precautions / Restrictions Precautions Precautions: Posterior Hip;Fall Restrictions Weight Bearing Restrictions: No      Mobility  Bed Mobility Overal bed mobility: Needs Assistance Bed Mobility: Supine to Sit     Supine to sit: Min assist;Mod assist;HOB elevated     General bed mobility comments: increased time with cues for sequence, use of L LE to self assist and adherence to THP  Transfers Overall transfer level: Needs assistance Equipment used: Rolling walker (2 wheeled) Transfers: Sit to/from Stand Sit to Stand: Min assist;Mod assist         General transfer comment: cues for LE management, use of UEs to self assist and adherence to THP  Ambulation/Gait Ambulation/Gait assistance: Min assist Gait Distance (Feet): 60 Feet Assistive device: Rolling walker (2 wheeled) Gait Pattern/deviations: Step-to pattern;Decreased step length - right;Decreased step length - left;Shuffle;Trunk flexed Gait velocity: decreased   General Gait Details: INcreased time with cues for sequence, posture, and position from [employer]            Wheelchair Mobility    Modified Rankin (Stroke Patients Only)       Balance Overall balance assessment: Needs assistance Sitting-balance support: Feet supported;No upper extremity  supported Sitting balance-Leahy Scale: Good     Standing balance support: Bilateral upper extremity supported Standing balance-Leahy Scale: Poor                               Pertinent Vitals/Pain Pain Assessment: 0-10 Pain Score: 3  Pain Location: R hip Pain Descriptors / Indicators: Aching;Sore Pain Intervention(s): Limited activity within patient's tolerance;Monitored during session;Premedicated before session;Ice applied    Home Living Family/patient expects to be discharged to:: Private residence Living Arrangements: Spouse/significant other Available Help at Discharge: Family;Available 24 hours/day Type of Home: House Home Access: Stairs to enter Entrance Stairs-Rails: Right Entrance Stairs-Number of Steps: 4 Home Layout: Two level;Able to live on main level with bedroom/bathroom Home Equipment: Bedside commode Additional Comments: Pt states he can borrow cane from neighbor    Prior Function Level of Independence: Independent               Hand Dominance        Extremity/Trunk Assessment   Upper Extremity Assessment Upper Extremity Assessment: Overall WFL for tasks assessed    Lower Extremity Assessment Lower Extremity Assessment: RLE deficits/detail       Communication   Communication: No difficulties  Cognition Arousal/Alertness: Awake/alert Behavior During Therapy: WFL for tasks assessed/performed Overall Cognitive Status: Within Functional Limits for tasks assessed  General Comments      Exercises Total Joint Exercises Ankle Circles/Pumps: AROM;Both;15 reps;Supine   Assessment/Plan    PT Assessment Patient needs continued PT services  PT Problem List Decreased strength;Decreased range of motion;Decreased activity tolerance;Decreased balance;Decreased mobility;Decreased knowledge of use of DME;Decreased knowledge of precautions;Pain;Obesity       PT Treatment  Interventions DME instruction;Gait training;Stair training;Therapeutic activities;Functional mobility training;Therapeutic exercise;Balance training;Patient/family education    PT Goals (Current goals can be found in the Care Plan section)  Acute Rehab PT Goals Patient Stated Goal: Regain IND PT Goal Formulation: With patient Time For Goal Achievement: 05/14/19 Potential to Achieve Goals: Good    Frequency 7X/week   Barriers to discharge        Co-evaluation               AM-PAC PT "6 Clicks" Mobility  Outcome Measure Help needed turning from your back to your side while in a flat bed without using bedrails?: A Lot Help needed moving from lying on your back to sitting on the side of a flat bed without using bedrails?: A Lot Help needed moving to and from a bed to a chair (including a wheelchair)?: A Little Help needed standing up from a chair using your arms (e.g., wheelchair or bedside chair)?: A Little Help needed to walk in hospital room?: A Little Help needed climbing 3-5 steps with a railing? : A Lot 6 Click Score: 15    End of Session Equipment Utilized During Treatment: Gait belt Activity Tolerance: Patient tolerated treatment well Patient left: in chair;with call bell/phone within reach;with chair alarm set;with nursing/sitter in room Nurse Communication: Mobility status PT Visit Diagnosis: Difficulty in walking, not elsewhere classified (R26.2)    Time: 1435-1520 PT Time Calculation (min) (ACUTE ONLY): 45 min   Charges:   PT Evaluation $PT Eval Low Complexity: 1 Low PT Treatments $Gait Training: 8-22 mins $Therapeutic Activity: 8-22 mins        [place] Pager [phone] Office [phone]   Avarey Yaeger 05/07/2019, 3:37 PM

## 2019-05-07 NOTE — Op Note (Signed)
Ralph Davis, Ralph Davis MEDICAL RECORD [id number] ACCOUNT [id number] DATE OF BIRTH:04/23/1945 FACILITY: [facility] LOCATION: [facility] PHYSICIAN:W. Marqus Macphee JR., MD  OPERATIVE REPORT  DATE OF PROCEDURE:  05/07/2019  PREOPERATIVE DIAGNOSIS:  Severe osteoarthritis, right hip.  POSTOPERATIVE DIAGNOSIS:  Severe osteoarthritis, right hip.  OPERATION:  Right total hip replacement (AML size 15 small statured stem with +8.5 mm neck length with 36 mm Biolox ceramic hip ball, 56 mm Pinnacle Gription cup with AltrX acetabular liner +4 mm 10-degree).  SURGEON:  Vangie Bicker, MD  ASSISTANT:  Jennet Maduro, PA  ANESTHESIA:  Spinal.  ESTIMATED BLOOD LOSS:  300 mL.  DESCRIPTION OF PROCEDURE:  Spinal anesthetic in lateral position posterior approach to the hip was made.  We split the iliotibial band, gluteus maximus fascia, and the short external rotators of the hip.  Did a T capsulotomy in the hip.  The patient had  significant shortening superior migration of the hip with some superior erosion of the acetabulum.  We medialized the acetabulum then progressively reamed it for 1 mm under reaming approximately 45-50 degrees abduction 10-50 degrees anteversion.  Trial  was placed followed by the final poly.  Femur was approached prior to this with a neck cut about 1 fingerbreadth above the lesser trochanter.  Progressive reaming and rasping to accept a trial broach 15 mm small stature trial broach.  We then trialled  with the broach in with the trial poly in.  Stability was good.  We could flex to full flexion, full adduction and internally rotate to approximately 60 degrees before there was any tendency to sublux.  This was all done with the trials.  This was  followed by placement of the final polyethylene liner with the buildup at about the 9 o'clock position for the right hip.  Final prosthesis was inserted with good press fit obtained on the femur as well with good stability of the prosthesis.   Again, all  parameters for stability were deemed to be acceptable.  Wound was copiously irrigated throughout the case.  He was closed with interrupted Ethibond on the capsule, a running Ethibond on the fascia lata and gluteus maximus fascia, 2-0 Vicryl and Monocryl  in the skin.  A lightly compressive sterile dressing and a knee immobilizer applied.  Taken to recovery room in stable condition.  CN/NUANCE  D:05/07/2019 T:05/07/2019 JOB:[id number]

## 2019-05-07 NOTE — Anesthesia Procedure Notes (Signed)
Spinal  Patient location during procedure: OR Start time: 05/07/2019 7:36 AM End time: 05/07/2019 7:41 AM Reason for block: at surgeon's request Staffing Performed: resident/CRNA  Anesthesiologist: Singer, James, MD Resident/CRNA: Walker, Karen L, CRNA Preanesthetic Checklist Completed: patient identified, IV checked, site marked, risks and benefits discussed, surgical consent, monitors and equipment checked, pre-op evaluation and timeout performed Spinal Block Patient position: sitting Prep: DuraPrep Patient monitoring: heart rate, continuous pulse ox and blood pressure Approach: midline Location: L3-4 Injection technique: single-shot Needle Needle type: Pencan  Needle gauge: 24 G Needle length: 10 cm Assessment Sensory level: T4 Additional Notes Expiration of kit checked and confirmed. Patient tolerated procedure well,without complications  with noted clear CSF. Loss of motor and sensory on exam post injection. Dr Singer present for procedure.     

## 2019-05-08 ENCOUNTER — Encounter (HOSPITAL_COMMUNITY): Payer: Self-pay | Admitting: Orthopedic Surgery

## 2019-05-08 DIAGNOSIS — I10 Essential (primary) hypertension: Secondary | ICD-10-CM | POA: Diagnosis present

## 2019-05-08 DIAGNOSIS — M1611 Unilateral primary osteoarthritis, right hip: Secondary | ICD-10-CM | POA: Diagnosis not present

## 2019-05-08 DIAGNOSIS — G4730 Sleep apnea, unspecified: Secondary | ICD-10-CM | POA: Diagnosis present

## 2019-05-08 DIAGNOSIS — M6281 Muscle weakness (generalized): Secondary | ICD-10-CM | POA: Diagnosis not present

## 2019-05-08 LAB — CBC
HCT: 37.2 % — ABNORMAL LOW (ref 39.0–52.0)
Hemoglobin: 12 g/dL — ABNORMAL LOW (ref 13.0–17.0)
MCH: 30.4 pg (ref 26.0–34.0)
MCHC: 32.3 g/dL (ref 30.0–36.0)
MCV: 94.2 fL (ref 80.0–100.0)
Platelets: 126 10*3/uL — ABNORMAL LOW (ref 150–400)
RBC: 3.95 MIL/uL — ABNORMAL LOW (ref 4.22–5.81)
RDW: 12.9 % (ref 11.5–15.5)
WBC: 9.3 10*3/uL (ref 4.0–10.5)
nRBC: 0 % (ref 0.0–0.2)

## 2019-05-08 LAB — BASIC METABOLIC PANEL
Anion gap: 8 (ref 5–15)
BUN: 12 mg/dL (ref 8–23)
CO2: 24 mmol/L (ref 22–32)
Calcium: 8.3 mg/dL — ABNORMAL LOW (ref 8.9–10.3)
Chloride: 108 mmol/L (ref 98–111)
Creatinine, Ser: 0.68 mg/dL (ref 0.61–1.24)
GFR calc Af Amer: >60 mL/min (ref >60)
GFR calc non Af Amer: >60 mL/min (ref >60)
Glucose, Bld: 127 mg/dL — ABNORMAL HIGH (ref 70–99)
Potassium: 3.7 mmol/L (ref 3.5–5.1)
Sodium: 140 mmol/L (ref 135–145)

## 2019-05-08 NOTE — Evaluation (Addendum)
Occupational Therapy Evaluation Patient Details Name: Ralph Davis MRN: [id number] DOB: 29-Dec-1945 Today's Date: 05/08/2019    History of Present Illness Pt s/p R THR and with hx of L TKR    Clinical Impression   Pt admitted with the above diagnoses and presents with below problem list. Pt will benefit from continued acute OT to address the below listed deficits and maximize independence with basic ADLs prior to d/c home. PTA pt was independent with ADLs. Pt is currently close min guard with household distance functional mobility, min A with toilet and tub bench transfers, min to mod A with LB ADLs (vs using AE which was discussed). Discussed strategies, AE, and DME and reviewed functional impact of posterior hip precautions. SO present at end of session and included in education.      Follow Up Recommendations  Home health OT;Follow surgeon's recommendation for DC plan and follow-up therapies    Equipment Recommendations  3 in 1 bedside commode    Recommendations for Other Services       Precautions / Restrictions Precautions Precautions: Posterior Hip;Fall Precaution Booklet Issued: Yes (comment) Restrictions Weight Bearing Restrictions: No      Mobility Bed Mobility               General bed mobility comments: received OOB, up to chair at end of session  Transfers Overall transfer level: Needs assistance Equipment used: Rolling walker (2 wheeled) Transfers: Sit to/from Stand Sit to Stand: Min guard;Min assist         General transfer comment: cues for technie with rw and posterior hip precautions. light min A to steady and control descent. to/from 3n1 over toilet and recliner    Balance Overall balance assessment: Needs assistance Sitting-balance support: Feet supported;No upper extremity supported Sitting balance-Leahy Scale: Good     Standing balance support: Bilateral upper extremity supported Standing balance-Leahy Scale: Poor                              ADL either performed or assessed with clinical judgement   ADL Overall ADL's : Needs assistance/impaired Eating/Feeding: Set up;Sitting   Grooming: Min guard;Standing   Upper Body Bathing: Set up;Sitting   Lower Body Bathing: Minimal assistance;Sit to/from stand;Moderate assistance   Upper Body Dressing : Set up;Sitting   Lower Body Dressing: Minimal assistance;Moderate assistance;Sit to/from stand   Toilet Transfer: Min guard;Minimal assistance;Comfort height toilet;RW;Grab bars Armed forces technical officer Details (indicate cue type and reason): light A to steady and control descent Toileting- Clothing Manipulation and Hygiene: Minimal assistance;Set up;Min guard;Sit to/from stand;Sitting/lateral lean   Tub/ Shower Transfer: Min guard;Minimal assistance;Tub transfer;Ambulation;Tub bench;Rolling walker Tub/Shower Transfer Details (indicate cue type and reason): Discussed tub bench and technique. Shower is upstairs. Pt plans to sponge bathe initially Functional mobility during ADLs: Min guard;Rolling walker General ADL Comments: Close min guard for safety. Completed household distance functional mobility. Extra time and effort. Some increased pain with WB but able to continue.      Vision         Perception     Praxis      Pertinent Vitals/Pain Pain Assessment: Faces Faces Pain Scale: Hurts even more Pain Location: R hip with OOB activity Pain Descriptors / Indicators: Grimacing;Guarding;Aching;Sore     Hand Dominance     Extremity/Trunk Assessment Upper Extremity Assessment Upper Extremity Assessment: Overall WFL for tasks assessed   Lower Extremity Assessment Lower Extremity Assessment: Defer to PT evaluation  Communication Communication Communication: No difficulties   Cognition Arousal/Alertness: Awake/alert Behavior During Therapy: WFL for tasks assessed/performed Overall Cognitive Status: Within Functional Limits for tasks assessed                                      General Comments       Exercises     Shoulder Instructions      Home Living Family/patient expects to be discharged to:: Private residence Living Arrangements: Spouse/significant other Available Help at Discharge: Family;Available 24 hours/day Type of Home: House Home Access: Stairs to enter [employer] of Steps: 4 Entrance Stairs-Rails: Right Home Layout: Two level;Able to live on main level with bedroom/bathroom Alternate Level Stairs-Number of Steps: 14 Alternate Level Stairs-Rails: Right     Bathroom Toilet: Standard     Home Equipment: Bedside commode   Additional Comments: Pt states he can borrow cane from neighbor. Pt reports he can borrow tub bench from a friend.      Prior Functioning/Environment Level of Independence: Independent                 OT Problem List: Decreased strength;Impaired balance (sitting and/or standing);Decreased knowledge of use of DME or AE;Decreased knowledge of precautions;Pain      OT Treatment/Interventions: Self-care/ADL training;DME and/or AE instruction;Patient/family education;Balance training;Therapeutic activities    OT Goals(Current goals can be found in the care plan section) Acute Rehab OT Goals Patient Stated Goal: Regain IND OT Goal Formulation: With patient Time For Goal Achievement: 05/22/19 Potential to Achieve Goals: Good ADL Goals Pt Will Perform Lower Body Bathing: with modified independence;sit to/from stand;with adaptive equipment Pt Will Perform Lower Body Dressing: with modified independence;with adaptive equipment;sit to/from stand Pt Will Transfer to Toilet: with modified independence;ambulating Pt Will Perform Toileting - Clothing Manipulation and hygiene: with modified independence;sit to/from stand Pt Will Perform Tub/Shower Transfer: with modified independence;ambulating;3 in 1;rolling walker Additional ADL Goal #1: Pt will complete bed  mobility at supervision level to prepare for OOB ADLs.  OT Frequency: Min 2X/week   Barriers to D/C:            Co-evaluation              AM-PAC OT "6 Clicks" Daily Activity     Outcome Measure Help from another person eating meals?: None Help from another person taking care of personal grooming?: None Help from another person toileting, which includes using toliet, bedpan, or urinal?: A Little Help from another person bathing (including washing, rinsing, drying)?: A Little Help from another person to put on and taking off regular upper body clothing?: None Help from another person to put on and taking off regular lower body clothing?: A Little 6 Click Score: 21   End of Session Equipment Utilized During Treatment: Rolling walker;Gait belt  Activity Tolerance: Patient tolerated treatment well;Other (comment)(increased pain with OOB activity but able to continue) Patient left: in chair;with call bell/phone within reach;with family/visitor present  OT Visit Diagnosis: Unsteadiness on feet (R26.81);Pain Pain - Right/Left: Right Pain - part of body: Hip                Time: [id number] OT Time Calculation (min): 22 min Charges:  OT General Charges $OT Visit: 1 Visit OT Evaluation $OT Eval Low Complexity: [place], OT Acute Rehabilitation Services Pager: [phone] Office: [phone]   Hortencia Pilar 05/08/2019, 10:42 AM

## 2019-05-08 NOTE — TOC Initial Note (Signed)
Transition of Care [facility]) - Initial/Assessment Note    Patient Details  Name: Ralph Davis MRN: [id number] Date of Birth: [birth year]  Transition [facility]) CM/SW Contact:    Joaquin Courts, RN Phone Number: 05/08/2019, 12:15 PM  Clinical Narrative:   Patient plans to discharge home with outpatient physical therapy.  [employer] to deliver rolling walker to bedside.  Patient declines 3in1.                 Expected Discharge Plan: Home/Self Care Barriers to Discharge: No Barriers Identified   Patient Goals and CMS Choice Patient states their goals for this hospitalization and ongoing recovery are:: to go home with outpatient therapy      Expected Discharge Plan and Services Expected Discharge Plan: Home/Self Care   Discharge Planning Services: CM Consult   Living arrangements for the past 2 months: Single Family Home Expected Discharge Date: 05/08/19               DME Arranged: Gilford Rile rolling DME Agency: [employer] Date DME Agency Contacted: 05/08/19 Time DME Agency Contacted: 1214 Representative spoke with at DME Agency: [place]: NA [place] Agency: NA        Prior Living Arrangements/Services Living arrangements for the past 2 months: [place]   Patient language and need for interpreter reviewed:: Yes Do you feel safe going back to the place where you live?: Yes      Need for Family Participation in Patient Care: Yes (Comment) Care giver support system in place?: Yes (comment)   Criminal Activity/Legal Involvement Pertinent to Current Situation/Hospitalization: No - Comment as needed  Activities of Daily Living Home Assistive Devices/Equipment: Eyeglasses, Bedside commode/3-in-1 ADL Screening (condition at time of admission) Patient's cognitive ability adequate to safely complete daily activities?: Yes Is the patient deaf or have difficulty hearing?: No Does the patient have difficulty seeing, even when wearing glasses/contacts?: No Does  the patient have difficulty concentrating, remembering, or making decisions?: No Patient able to express need for assistance with ADLs?: Yes Does the patient have difficulty dressing or bathing?: No Independently performs ADLs?: Yes (appropriate for developmental age) Does the patient have difficulty walking or climbing stairs?: No Weakness of Legs: None Weakness of Arms/Hands: None  Permission Sought/Granted                  Emotional Assessment Appearance:: Appears stated age Attitude/Demeanor/Rapport: Engaged Affect (typically observed): Accepting Orientation: : Oriented to Self, Oriented to Place, Oriented to  Time, Oriented to Situation   Psych Involvement: No (comment)  Admission diagnosis:  Degenerative joint disease of left hip [M16.12] Patient Active Problem List   Diagnosis Date Noted  . Degenerative joint disease of left hip 05/07/2019   PCP:  Vernie Shanks, MD Pharmacy:   [facility], [place] [address] Phone: [phone] Fax: [phone]     Social Determinants of Health (SDOH) Interventions    Readmission Risk Interventions No flowsheet data found.

## 2019-05-08 NOTE — Progress Notes (Signed)
[facility]edic [facility] Progress Note weekend Coverage   Patient ID: Ralph Davis MRN: [id number] DOB/AGE: 1946-01-21 74 y.o.  Subjective:  Pt doing well Pain controlled Participating with therapies  Some mild balance issues  Therapy notes reviewed  After review I think pt would benefit from another night and some more therapy tomorrow    Review of Systems  Constitutional: Negative for chills and fever.  Respiratory: Negative for shortness of breath and wheezing.   Cardiovascular: Negative for chest pain and palpitations.  Gastrointestinal: Negative for abdominal pain, nausea and vomiting.  Genitourinary: Negative for dysuria.  Neurological: Negative for tingling and sensory change.    Objective:   VITALS:   Vitals:   05/08/19 0110 05/08/19 0558 05/08/19 1027 05/08/19 1156  BP: (!) 121/58 (!) 107/57 (!) 106/58 118/62  Pulse: 91 75 79   Resp: 18 16 18    Temp: 99 F (37.2 C) 99.1 F (37.3 C) 99 F (37.2 C)   TempSrc: Oral Oral    SpO2: 94% 94% 94%   Weight:      Height:        Estimated body mass index is 31.45 kg/m as calculated from the following:   Height as of this encounter: 5\' 9"  (1.753 m).   Weight as of this encounter: 96.6 kg.   Intake/Output      03/19 0701 - 03/20 0700 03/20 0701 - 03/21 0700   P.O. 1140 120   I.V. (mL/kg) 3468.9 (35.9) 299.9 (3.1)   IV Piggyback 450    Total Intake(mL/kg) 5058.9 (52.4) 419.9 (4.3)   Urine (mL/kg/hr) 1770 (0.8) 1100 (1.4)   Blood 350    Total Output 2120 1100   Net +2938.9 -680.1          LABS  Results for orders placed or performed during the hospital encounter of 05/07/19 (from the past 24 hour(s))  CBC     Status: Abnormal   Collection Time: 05/08/19  2:18 AM  Result Value Ref Range   WBC 9.3 4.0 - 10.5 K/uL   RBC 3.95 (L) 4.22 - 5.81 MIL/uL   Hemoglobin 12.0 (L) 13.0 - 17.0 g/dL   HCT 37.2 (L) 39.0 - 52.0 %   MCV 94.2  80.0 - 100.0 fL   MCH 30.4 26.0 - 34.0 pg   MCHC 32.3 30.0 - 36.0 g/dL   RDW 12.9 11.5 - 15.5 %   Platelets 126 (L) 150 - 400 K/uL   nRBC 0.0 0.0 - 0.2 %  Basic metabolic panel     Status: Abnormal   Collection Time: 05/08/19  2:18 AM  Result Value Ref Range   Sodium 140 135 - 145 mmol/L   Potassium 3.7 3.5 - 5.1 mmol/L   Chloride 108 98 - 111 mmol/L   CO2 24 22 - 32 mmol/L   Glucose, Bld 127 (H) 70 - 99 mg/dL   BUN 12 8 - 23 mg/dL   Creatinine, Ser 0.68 0.61 - 1.24 mg/dL   Calcium 8.3 (L) 8.9 - 10.3 mg/dL   GFR calc non Af Amer >60 >60 mL/min   GFR calc Af Amer >60 >60 mL/min   Anion gap 8 5 - 15     PHYSICAL EXAM:   Gen: sitting up in bed, pleasant, NAD  Lungs: CTA B Cardiac: RRR Abd: + BS, NT  Ext:       Right Lower Extremity   Scant drainage on dressing, o/w stable  Ext warm   Ted hose in place  No DCT  DPN, SPN, TN sensation intact  EHL, FHL, AT, PT, peroneals, gastroc motor intact  + Quad set  + DP Pulse  No swelling of significance   Assessment/Plan: 1 Day Post-Op   Principal Problem:   Degenerative joint disease of right hip Active Problems:   Sleep apnea   Hypertension   Anti-infectives (From admission, onward)   Start     Dose/Rate Route Frequency Ordered Stop   05/07/19 1400  ceFAZolin (ANCEF) IVPB 2g/100 mL premix     2 g 200 mL/hr over 30 Minutes Intravenous Every 6 hours 05/07/19 1221 05/07/19 2156   05/07/19 0600  ceFAZolin (ANCEF) IVPB 2g/100 mL premix     2 g 200 mL/hr over 30 Minutes Intravenous On call to O.R. 05/07/19 [id number] 05/07/19 [id number]    .  POD/HD#: 1  74 y/o male with end stage djd R hip   -End-stage DJD right hip s/p right total hip arthroplasty  Weightbearing as tolerated  Posterior precautions  Dressing to remain on until follow-up.  Wound care instructions included in discharge paperwork  PT and OT  Ice as needed for swelling and pain control    Plan for discharge tomorrow.  Think patient would benefit from additional  therapy sessions after review of therapy notes  - Pain management:  Continue with current regimen, minimal narcotic intake  - ABL anemia/Hemodynamics  Stable  - Medical issues   Home medications  - DVT/PE prophylaxis:  81 mg aspirin daily per surgical protocol  - ID:   Perioperative antibiotics completed  - Activity:  As above  - FEN/GI prophylaxis/Foley/Lines:  Regular diet  NSL IVF  - Dispo:  Continue with inpatient care.  Discharge home tomorrow after therapy    Ralph Pigg, PA-C [phone] Ralph Davis) 05/08/2019, 3:24 PM  [facility] [place] [address] [phone] (F)

## 2019-05-08 NOTE — Progress Notes (Signed)
Physical Therapy Treatment Patient Details Name: Ralph Davis MRN: [id number] DOB: 1945-07-11 Today's Date: 05/08/2019    History of Present Illness Pt s/p R THR and with hx of L TKR     PT Comments    Pt continues to improve in stability with transfers and ambulation and states feeling much more comfortable with ability to manage at home with spouse.  Pt negotiated stairs and reviewed HEP with written instruction provided.  Pt states first OP PT in two days.   Follow Up Recommendations  Home health PT;Follow surgeon's recommendation for DC plan and follow-up therapies     Equipment Recommendations  Rolling walker with 5" wheels    Recommendations for Other Services OT consult     Precautions / Restrictions Precautions Precautions: Posterior Hip;Fall Precaution Comments: Pt recalled 3/3 THP Restrictions Weight Bearing Restrictions: No    Mobility  Bed Mobility Overal bed mobility: Needs Assistance Bed Mobility: Supine to Sit;Sit to Supine     Supine to sit: Min guard Sit to supine: Min guard   General bed mobility comments: Increased time with cues for sequence, use of L LE and adherence to THP; Pt self assisting R LE to EOB with gait belt  Transfers Overall transfer level: Needs assistance Equipment used: Rolling walker (2 wheeled) Transfers: Sit to/from Stand Sit to Stand: Min guard;Supervision         General transfer comment: cues for LE management, adherence to THP and use of UEs to self assist;   Ambulation/Gait Ambulation/Gait assistance: Min guard;Supervision Gait Distance (Feet): 60 Feet Assistive device: Rolling walker (2 wheeled) Gait Pattern/deviations: Step-to pattern;Decreased step length - right;Decreased step length - left;Shuffle;Trunk flexed Gait velocity: decreased   General Gait Details: INcreased time with min cues for sequence, posture, and position from RW   Stairs Stairs: Yes Stairs assistance: Min assist Stair Management:  One rail Right;Step to pattern;Forwards;With crutches Number of Stairs: 5 General stair comments: 2+3 stairs with crutch, rail and cues for sequence and foot/crutch placement    Wheelchair Mobility    Modified Rankin (Stroke Patients Only)       Balance Overall balance assessment: Needs assistance Sitting-balance support: Feet supported;No upper extremity supported Sitting balance-Leahy Scale: Good     Standing balance support: Bilateral upper extremity supported Standing balance-Leahy Scale: Fair                              Cognition Arousal/Alertness: Awake/alert Behavior During Therapy: WFL for tasks assessed/performed Overall Cognitive Status: Within Functional Limits for tasks assessed                                        Exercises      General Comments        Pertinent Vitals/Pain Pain Assessment: 0-10 Pain Score: 3  Pain Location: R hip Pain Descriptors / Indicators: Aching;Burning;Sore Pain Intervention(s): Limited activity within patient's tolerance;Monitored during session;Premedicated before session;Ice applied    Home Living                      Prior Function            PT Goals (current goals can now be found in the care plan section) Acute Rehab PT Goals Patient Stated Goal: Regain IND PT Goal Formulation: With patient Time For Goal Achievement: 05/14/19 Potential to Achieve  Goals: Good Progress towards PT goals: Progressing toward goals    Frequency    7X/week      PT Plan Current plan remains appropriate    Co-evaluation              AM-PAC PT "6 Clicks" Mobility   Outcome Measure  Help needed turning from your back to your side while in a flat bed without using bedrails?: A Lot Help needed moving from lying on your back to sitting on the side of a flat bed without using bedrails?: A Little Help needed moving to and from a bed to a chair (including a wheelchair)?: A Little Help  needed standing up from a chair using your arms (e.g., wheelchair or bedside chair)?: A Little Help needed to walk in hospital room?: A Little Help needed climbing 3-5 steps with a railing? : A Little 6 Click Score: 17    End of Session Equipment Utilized During Treatment: Gait belt Activity Tolerance: Patient tolerated treatment well Patient left: in bed Nurse Communication: Mobility status PT Visit Diagnosis: Difficulty in walking, not elsewhere classified (R26.2)     Time: [id number] PT Time Calculation (min) (ACUTE ONLY): 33 min  Charges:  $Gait Training: 8-22 mins $Therapeutic Activity: 8-22 mins                     [place] Pager [phone] Office [phone]    Brannon Decaire 05/08/2019, 4:14 PM

## 2019-05-08 NOTE — Progress Notes (Signed)
Physical Therapy Treatment Patient Details Name: Ralph Davis MRN: [id number] DOB: [birth year] Today's Date: 05/08/2019    History of Present Illness Pt s/p R THR and with hx of L TKR     PT Comments    Pt motivated and demonstrating increased activity tolerance this am but with noted increased in instability with ambulation - ?pain meds.   Follow Up Recommendations  Home health PT;Follow surgeon's recommendation for DC plan and follow-up therapies     Equipment Recommendations  Rolling walker with 5" wheels    Recommendations for Other Services OT consult     Precautions / Restrictions Precautions Precautions: Posterior Hip;Fall Precaution Booklet Issued: Yes (comment) Precaution Comments: Pt recalled 3/3 THP Restrictions Weight Bearing Restrictions: No    Mobility  Bed Mobility Overal bed mobility: Needs Assistance Bed Mobility: Supine to Sit     Supine to sit: Min assist     General bed mobility comments: cues for sequence and use of L LE and gait belt to self assist; physical assist to bring trunk to upright sitting  Transfers Overall transfer level: Needs assistance Equipment used: Rolling walker (2 wheeled) Transfers: Sit to/from Stand Sit to Stand: Min guard;Min assist         General transfer comment: cues for LE management, adherence to THP and use of UEs to self assist;   Ambulation/Gait Ambulation/Gait assistance: Min assist Gait Distance (Feet): 60 Feet Assistive device: Rolling walker (2 wheeled) Gait Pattern/deviations: Step-to pattern;Decreased step length - right;Decreased step length - left;Shuffle;Trunk flexed Gait velocity: decreased   General Gait Details: INcreased time with cues for sequence, posture, and position from [employer]             Wheelchair Mobility    Modified Rankin (Stroke Patients Only)       Balance Overall balance assessment: Needs assistance Sitting-balance support: Feet supported;No upper  extremity supported Sitting balance-Leahy Scale: Good     Standing balance support: Bilateral upper extremity supported Standing balance-Leahy Scale: Poor                              Cognition Arousal/Alertness: Awake/alert Behavior During Therapy: WFL for tasks assessed/performed Overall Cognitive Status: Within Functional Limits for tasks assessed                                        Exercises Total Joint Exercises Ankle Circles/Pumps: AROM;Both;15 reps;Supine Quad Sets: AROM;Both;10 reps;Supine Heel Slides: AAROM;Right;20 reps;Supine Hip ABduction/ADduction: AAROM;Right;15 reps;Supine    General Comments        Pertinent Vitals/Pain Pain Assessment: 0-10 Pain Score: 3  Faces Pain Scale: Hurts even more Pain Location: R hip Pain Descriptors / Indicators: Aching;Burning;Sore Pain Intervention(s): Limited activity within patient's tolerance;Monitored during session;Premedicated before session    [place] expects to be discharged to:: Private residence Living Arrangements: Spouse/significant other Available Help at Discharge: Family;Available 24 hours/day Type of Home: House Home Access: Stairs to enter Entrance Stairs-Rails: Right Home Layout: Two level;Able to live on main level with bedroom/bathroom Home Equipment: Bedside commode Additional Comments: Pt states he can borrow cane from neighbor    Prior Function Level of Independence: Independent          PT Goals (current goals can now be found in the care plan section) Acute Rehab PT Goals Patient Stated Goal: Regain IND PT  Goal Formulation: With patient Time For Goal Achievement: 05/14/19 Potential to Achieve Goals: Good Progress towards PT goals: Progressing toward goals    Frequency    7X/week      PT Plan Current plan remains appropriate    Co-evaluation              AM-PAC PT "6 Clicks" Mobility   Outcome Measure  Help needed turning  from your back to your side while in a flat bed without using bedrails?: A Lot Help needed moving from lying on your back to sitting on the side of a flat bed without using bedrails?: A Little Help needed moving to and from a bed to a chair (including a wheelchair)?: A Little Help needed standing up from a chair using your arms (e.g., wheelchair or bedside chair)?: A Little Help needed to walk in hospital room?: A Little Help needed climbing 3-5 steps with a railing? : A Lot 6 Click Score: 16    End of Session Equipment Utilized During Treatment: Gait belt Activity Tolerance: Patient tolerated treatment well Patient left: Other (comment)(Standing in room with OT) Nurse Communication: Mobility status PT Visit Diagnosis: Difficulty in walking, not elsewhere classified (R26.2)     Time: [id number] PT Time Calculation (min) (ACUTE ONLY): 36 min  Charges:  $Gait Training: 8-22 mins $Therapeutic Exercise: 8-22 mins                     [address] Pager [phone] Office [phone]    Meighan Treto 05/08/2019, 12:02 PM

## 2019-05-08 NOTE — Plan of Care (Signed)

## 2019-05-08 NOTE — Progress Notes (Signed)
Physical Therapy Treatment Patient Details Name: Ralph Davis MRN: [id number] DOB: 05-24-1945 Today's Date: 05/08/2019    History of Present Illness Pt s/p R THR and with hx of L TKR     PT Comments    Pt motivated and with marked improvement in stability with ambulation.  Pt ambulated increased distance in hall, reviewed transfers into bed and reviewed car transfers.   Follow Up Recommendations  Home health PT;Follow surgeon's recommendation for DC plan and follow-up therapies     Equipment Recommendations  Rolling walker with 5" wheels    Recommendations for Other Services OT consult     Precautions / Restrictions Precautions Precautions: Posterior Hip;Fall Restrictions Weight Bearing Restrictions: No    Mobility  Bed Mobility Overal bed mobility: Needs Assistance Bed Mobility: Sit to Supine       Sit to supine: Min guard   General bed mobility comments: Increased time with cues for sequence, use of L LE and adherence to THP  Transfers Overall transfer level: Needs assistance Equipment used: Rolling walker (2 wheeled) Transfers: Sit to/from Stand Sit to Stand: Min guard         General transfer comment: cues for LE management, adherence to THP and use of UEs to self assist;   Ambulation/Gait Ambulation/Gait assistance: Min guard Gait Distance (Feet): 100 Feet Assistive device: Rolling walker (2 wheeled) Gait Pattern/deviations: Step-to pattern;Decreased step length - right;Decreased step length - left;Shuffle;Trunk flexed Gait velocity: decreased   General Gait Details: INcreased time with cues for sequence, posture, and position from [employer]             Wheelchair Mobility    Modified Rankin (Stroke Patients Only)       Balance Overall balance assessment: Needs assistance Sitting-balance support: Feet supported;No upper extremity supported Sitting balance-Leahy Scale: Good     Standing balance support: Bilateral upper  extremity supported Standing balance-Leahy Scale: Poor                              Cognition Arousal/Alertness: Awake/alert Behavior During Therapy: WFL for tasks assessed/performed Overall Cognitive Status: Within Functional Limits for tasks assessed                                        Exercises      General Comments        Pertinent Vitals/Pain Pain Assessment: 0-10 Pain Score: 3  Pain Location: R hip Pain Descriptors / Indicators: Aching;Burning;Sore Pain Intervention(s): Limited activity within patient's tolerance;Monitored during session;Ice applied    Home Living                      Prior Function            PT Goals (current goals can now be found in the care plan section) Acute Rehab PT Goals Patient Stated Goal: Regain IND PT Goal Formulation: With patient Time For Goal Achievement: 05/14/19 Potential to Achieve Goals: Good Progress towards PT goals: Progressing toward goals    Frequency    7X/week      PT Plan Current plan remains appropriate    Co-evaluation              AM-PAC PT "6 Clicks" Mobility   Outcome Measure  Help needed turning from your back to your side while in a  flat bed without using bedrails?: A Lot Help needed moving from lying on your back to sitting on the side of a flat bed without using bedrails?: A Little Help needed moving to and from a bed to a chair (including a wheelchair)?: A Little Help needed standing up from a chair using your arms (e.g., wheelchair or bedside chair)?: A Little Help needed to walk in hospital room?: A Little Help needed climbing 3-5 steps with a railing? : A Lot 6 Click Score: 16    End of Session Equipment Utilized During Treatment: Gait belt Activity Tolerance: Patient tolerated treatment well Patient left: in bed Nurse Communication: Mobility status PT Visit Diagnosis: Difficulty in walking, not elsewhere classified (R26.2)     Time:  [id number] PT Time Calculation (min) (ACUTE ONLY): 26 min  Charges:  $Gait Training: 8-22 mins $Therapeutic Activity: 8-22 mins                     [place] Pager [phone] Office [phone]    Ling Flesch 05/08/2019, 4:09 PM

## 2019-05-09 DIAGNOSIS — M1611 Unilateral primary osteoarthritis, right hip: Secondary | ICD-10-CM | POA: Diagnosis not present

## 2019-05-09 LAB — CBC
HCT: 36.3 % — ABNORMAL LOW (ref 39.0–52.0)
Hemoglobin: 11.9 g/dL — ABNORMAL LOW (ref 13.0–17.0)
MCH: 30.9 pg (ref 26.0–34.0)
MCHC: 32.8 g/dL (ref 30.0–36.0)
MCV: 94.3 fL (ref 80.0–100.0)
Platelets: 125 10*3/uL — ABNORMAL LOW (ref 150–400)
RBC: 3.85 MIL/uL — ABNORMAL LOW (ref 4.22–5.81)
RDW: 13.2 % (ref 11.5–15.5)
WBC: 10.8 10*3/uL — ABNORMAL HIGH (ref 4.0–10.5)
nRBC: 0 % (ref 0.0–0.2)

## 2019-05-09 NOTE — Plan of Care (Signed)

## 2019-05-09 NOTE — Progress Notes (Signed)
[facility] Weekend Coverage  2 Days Post-Op Procedure(s) (LRB): TOTAL HIP ARTHROPLASTY (Right)  Subjective: Patient reports pain as mild. Did fabulous with PT, very pleased with overall experience. Pain 0-1/10.  Objective: Current Vitals Blood pressure (!) 158/73, pulse 99, temperature 98.8 F (37.1 C), resp. rate 16, height 5\' 9"  (1.753 m), weight 96.6 kg, SpO2 95 %. Vital signs in last 24 hours: Temp:  [98.8 F (37.1 C)-99 F (37.2 C)] 98.8 F (37.1 C) (03/21 0604) Pulse Rate:  [79-99] 99 (03/21 0604) Resp:  [16-18] 16 (03/21 0604) BP: (106-158)/(58-73) 158/73 (03/21 0604) SpO2:  [92 %-95 %] 95 % (03/21 0604)  Intake/Output from previous day: 03/20 0701 - 03/21 0700 In: 719.9 [P.O.:120; I.V.:599.9] Out: 3725 [Urine:3725]  LABS Recent Labs    05/08/19 0218 05/09/19 0305  HGB 12.0* 11.9*   Recent Labs    05/08/19 0218 05/09/19 0305  WBC 9.3 10.8*  RBC 3.95* 3.85*  HCT 37.2* 36.3*  PLT 126* 125*   Recent Labs    05/08/19 0218  NA 140  K 3.7  CL 108  CO2 24  BUN 12  CREATININE 0.68  GLUCOSE 127*  CALCIUM 8.3*   No results for input(s): LABPT, INR in the last 72 hours.   Physical Exam RLE  Dressing intact, clean, dry without a spot  Edema/ swelling controlled  Sens: DPN, SPN, TN intact  Motor: EHL, FHL, and lessor toe ext and flex all intact grossly  Brisk cap refill, warm to touch  Assessment/Plan: 2 Days Post-Op Procedure(s) (LRB): TOTAL HIP ARTHROPLASTY (Right) 1. PT/OT  2. DVT proph ECASA 81 mg 3. F/u 8-14 days with discharge today  Altamese [place], MD [facility], [facility] [phone]

## 2019-05-09 NOTE — Progress Notes (Signed)
Physical Therapy Treatment Patient Details Name: Ralph Davis MRN: [id number] DOB: [birth year] Today's Date: 05/09/2019    History of Present Illness Pt s/p R THR and with hx of L TKR     PT Comments    Pt continues motivated and progressing well with mobility.  Pt reviewed car transfers, stairs, and HEP - written instruction provided.   Follow Up Recommendations  Home health PT;Follow surgeon's recommendation for DC plan and follow-up therapies     Equipment Recommendations  Rolling walker with 5" wheels    Recommendations for Other Services OT consult     Precautions / Restrictions Precautions Precautions: Posterior Hip;Fall Precaution Booklet Issued: Yes (comment) Precaution Comments: Pt recalled 3/3 THP Restrictions Weight Bearing Restrictions: No    Mobility  Bed Mobility Overal bed mobility: Needs Assistance Bed Mobility: Supine to Sit     Supine to sit: Supervision     General bed mobility comments: increased time with min cues  Transfers Overall transfer level: Needs assistance Equipment used: Rolling walker (2 wheeled) Transfers: Sit to/from Stand Sit to Stand: Supervision         General transfer comment: Pt self cues for technique  Ambulation/Gait Ambulation/Gait assistance: Min guard;Supervision Gait Distance (Feet): 75 Feet Assistive device: Rolling walker (2 wheeled) Gait Pattern/deviations: Step-to pattern;Decreased step length - right;Decreased step length - left;Shuffle;Trunk flexed Gait velocity: decreased   General Gait Details: INcreased time with min cues for sequence, posture, and position from RW   Stairs Stairs: Yes Stairs assistance: Min assist Stair Management: One rail Right;Step to pattern;Forwards;With crutches Number of Stairs: 5 General stair comments: 2+3 stairs with crutch, rail and cues for sequence and foot/crutch placement    Wheelchair Mobility    Modified Rankin (Stroke Patients Only)       Balance  Overall balance assessment: Needs assistance Sitting-balance support: Feet supported;No upper extremity supported Sitting balance-Leahy Scale: Good     Standing balance support: Bilateral upper extremity supported Standing balance-Leahy Scale: Fair                              Cognition Arousal/Alertness: Awake/alert Behavior During Therapy: WFL for tasks assessed/performed Overall Cognitive Status: Within Functional Limits for tasks assessed                                        Exercises Total Joint Exercises Ankle Circles/Pumps: AROM;Both;15 reps;Supine Quad Sets: AROM;Both;10 reps;Supine Heel Slides: AAROM;Right;20 reps;Supine Hip ABduction/ADduction: AAROM;Right;15 reps;Supine Long Arc Quad: AROM;Right;10 reps;Seated    General Comments        Pertinent Vitals/Pain Pain Assessment: 0-10 Pain Score: 3  Pain Location: R hip Pain Descriptors / Indicators: Aching;Burning;Sore Pain Intervention(s): Limited activity within patient's tolerance;Monitored during session;Premedicated before session    Home Living                      Prior Function            PT Goals (current goals can now be found in the care plan section) Acute Rehab PT Goals Patient Stated Goal: Regain IND PT Goal Formulation: With patient Time For Goal Achievement: 05/14/19 Potential to Achieve Goals: Good Progress towards PT goals: Progressing toward goals    Frequency    7X/week      PT Plan Current plan remains appropriate    Co-evaluation  AM-PAC PT "6 Clicks" Mobility   Outcome Measure  Help needed turning from your back to your side while in a flat bed without using bedrails?: A Lot Help needed moving from lying on your back to sitting on the side of a flat bed without using bedrails?: A Little Help needed moving to and from a bed to a chair (including a wheelchair)?: A Little Help needed standing up from a chair using  your arms (e.g., wheelchair or bedside chair)?: A Little Help needed to walk in hospital room?: A Little Help needed climbing 3-5 steps with a railing? : A Little 6 Click Score: 17    End of Session Equipment Utilized During Treatment: Gait belt Activity Tolerance: Patient tolerated treatment well Patient left: in chair;with call bell/phone within reach;with chair alarm set Nurse Communication: Mobility status PT Visit Diagnosis: Difficulty in walking, not elsewhere classified (R26.2)     Time: [id number] PT Time Calculation (min) (ACUTE ONLY): 38 min  Charges:  $Gait Training: 8-22 mins $Therapeutic Exercise: 8-22 mins $Therapeutic Activity: 8-22 mins                     [place] Pager [phone] Office [phone]    Oliviya Gilkison 05/09/2019, 11:10 AM

## 2019-05-10 DIAGNOSIS — M6281 Muscle weakness (generalized): Secondary | ICD-10-CM | POA: Diagnosis not present

## 2019-05-10 DIAGNOSIS — R262 Difficulty in walking, not elsewhere classified: Secondary | ICD-10-CM | POA: Diagnosis not present

## 2019-05-10 DIAGNOSIS — M1611 Unilateral primary osteoarthritis, right hip: Secondary | ICD-10-CM | POA: Diagnosis not present

## 2019-05-11 ENCOUNTER — Encounter: Payer: Self-pay | Admitting: *Deleted

## 2019-05-11 DIAGNOSIS — K5901 Slow transit constipation: Secondary | ICD-10-CM | POA: Diagnosis not present

## 2019-05-12 DIAGNOSIS — R262 Difficulty in walking, not elsewhere classified: Secondary | ICD-10-CM | POA: Diagnosis not present

## 2019-05-12 DIAGNOSIS — M6281 Muscle weakness (generalized): Secondary | ICD-10-CM | POA: Diagnosis not present

## 2019-05-12 DIAGNOSIS — M1611 Unilateral primary osteoarthritis, right hip: Secondary | ICD-10-CM | POA: Diagnosis not present

## 2019-05-13 DIAGNOSIS — Z7182 Exercise counseling: Secondary | ICD-10-CM | POA: Diagnosis not present

## 2019-05-13 DIAGNOSIS — Z6832 Body mass index (BMI) 32.0-32.9, adult: Secondary | ICD-10-CM | POA: Diagnosis not present

## 2019-05-13 DIAGNOSIS — E669 Obesity, unspecified: Secondary | ICD-10-CM | POA: Diagnosis not present

## 2019-05-14 DIAGNOSIS — M1611 Unilateral primary osteoarthritis, right hip: Secondary | ICD-10-CM | POA: Diagnosis not present

## 2019-05-14 DIAGNOSIS — R262 Difficulty in walking, not elsewhere classified: Secondary | ICD-10-CM | POA: Diagnosis not present

## 2019-05-14 DIAGNOSIS — M6281 Muscle weakness (generalized): Secondary | ICD-10-CM | POA: Diagnosis not present

## 2019-05-17 DIAGNOSIS — M6281 Muscle weakness (generalized): Secondary | ICD-10-CM | POA: Diagnosis not present

## 2019-05-17 DIAGNOSIS — R262 Difficulty in walking, not elsewhere classified: Secondary | ICD-10-CM | POA: Diagnosis not present

## 2019-05-17 DIAGNOSIS — M1611 Unilateral primary osteoarthritis, right hip: Secondary | ICD-10-CM | POA: Diagnosis not present

## 2019-05-19 DIAGNOSIS — R262 Difficulty in walking, not elsewhere classified: Secondary | ICD-10-CM | POA: Diagnosis not present

## 2019-05-19 DIAGNOSIS — M6281 Muscle weakness (generalized): Secondary | ICD-10-CM | POA: Diagnosis not present

## 2019-05-19 DIAGNOSIS — M1611 Unilateral primary osteoarthritis, right hip: Secondary | ICD-10-CM | POA: Diagnosis not present

## 2019-05-20 DIAGNOSIS — M25551 Pain in right hip: Secondary | ICD-10-CM | POA: Diagnosis not present

## 2019-05-25 DIAGNOSIS — E785 Hyperlipidemia, unspecified: Secondary | ICD-10-CM | POA: Diagnosis not present

## 2019-05-25 DIAGNOSIS — Z6832 Body mass index (BMI) 32.0-32.9, adult: Secondary | ICD-10-CM | POA: Diagnosis not present

## 2019-05-25 DIAGNOSIS — E669 Obesity, unspecified: Secondary | ICD-10-CM | POA: Diagnosis not present

## 2019-05-27 DIAGNOSIS — M1611 Unilateral primary osteoarthritis, right hip: Secondary | ICD-10-CM | POA: Diagnosis not present

## 2019-05-27 DIAGNOSIS — R262 Difficulty in walking, not elsewhere classified: Secondary | ICD-10-CM | POA: Diagnosis not present

## 2019-05-27 DIAGNOSIS — M6281 Muscle weakness (generalized): Secondary | ICD-10-CM | POA: Diagnosis not present

## 2019-05-31 DIAGNOSIS — M1611 Unilateral primary osteoarthritis, right hip: Secondary | ICD-10-CM | POA: Diagnosis not present

## 2019-05-31 DIAGNOSIS — R262 Difficulty in walking, not elsewhere classified: Secondary | ICD-10-CM | POA: Diagnosis not present

## 2019-05-31 DIAGNOSIS — M6281 Muscle weakness (generalized): Secondary | ICD-10-CM | POA: Diagnosis not present

## 2019-06-02 DIAGNOSIS — M1611 Unilateral primary osteoarthritis, right hip: Secondary | ICD-10-CM | POA: Diagnosis not present

## 2019-06-02 DIAGNOSIS — M6281 Muscle weakness (generalized): Secondary | ICD-10-CM | POA: Diagnosis not present

## 2019-06-02 DIAGNOSIS — R262 Difficulty in walking, not elsewhere classified: Secondary | ICD-10-CM | POA: Diagnosis not present

## 2019-06-07 DIAGNOSIS — M6281 Muscle weakness (generalized): Secondary | ICD-10-CM | POA: Diagnosis not present

## 2019-06-07 DIAGNOSIS — M1611 Unilateral primary osteoarthritis, right hip: Secondary | ICD-10-CM | POA: Diagnosis not present

## 2019-06-07 DIAGNOSIS — R262 Difficulty in walking, not elsewhere classified: Secondary | ICD-10-CM | POA: Diagnosis not present

## 2019-06-09 DIAGNOSIS — R262 Difficulty in walking, not elsewhere classified: Secondary | ICD-10-CM | POA: Diagnosis not present

## 2019-06-09 DIAGNOSIS — M6281 Muscle weakness (generalized): Secondary | ICD-10-CM | POA: Diagnosis not present

## 2019-06-09 DIAGNOSIS — M1611 Unilateral primary osteoarthritis, right hip: Secondary | ICD-10-CM | POA: Diagnosis not present

## 2019-06-17 DIAGNOSIS — M6281 Muscle weakness (generalized): Secondary | ICD-10-CM | POA: Diagnosis not present

## 2019-06-17 DIAGNOSIS — M1611 Unilateral primary osteoarthritis, right hip: Secondary | ICD-10-CM | POA: Diagnosis not present

## 2019-06-17 DIAGNOSIS — R262 Difficulty in walking, not elsewhere classified: Secondary | ICD-10-CM | POA: Diagnosis not present

## 2019-06-23 DIAGNOSIS — R262 Difficulty in walking, not elsewhere classified: Secondary | ICD-10-CM | POA: Diagnosis not present

## 2019-06-23 DIAGNOSIS — M6281 Muscle weakness (generalized): Secondary | ICD-10-CM | POA: Diagnosis not present

## 2019-06-23 DIAGNOSIS — M1611 Unilateral primary osteoarthritis, right hip: Secondary | ICD-10-CM | POA: Diagnosis not present

## 2019-06-25 DIAGNOSIS — Z713 Dietary counseling and surveillance: Secondary | ICD-10-CM | POA: Diagnosis not present

## 2019-06-25 DIAGNOSIS — E669 Obesity, unspecified: Secondary | ICD-10-CM | POA: Diagnosis not present

## 2019-06-25 DIAGNOSIS — Z6832 Body mass index (BMI) 32.0-32.9, adult: Secondary | ICD-10-CM | POA: Diagnosis not present

## 2019-06-28 DIAGNOSIS — G4733 Obstructive sleep apnea (adult) (pediatric): Secondary | ICD-10-CM | POA: Diagnosis not present

## 2019-06-28 DIAGNOSIS — Z6832 Body mass index (BMI) 32.0-32.9, adult: Secondary | ICD-10-CM | POA: Diagnosis not present

## 2019-06-28 DIAGNOSIS — E669 Obesity, unspecified: Secondary | ICD-10-CM | POA: Diagnosis not present

## 2019-06-29 DIAGNOSIS — G4733 Obstructive sleep apnea (adult) (pediatric): Secondary | ICD-10-CM | POA: Diagnosis not present

## 2019-07-02 DIAGNOSIS — L918 Other hypertrophic disorders of the skin: Secondary | ICD-10-CM | POA: Diagnosis not present

## 2019-07-02 DIAGNOSIS — L814 Other melanin hyperpigmentation: Secondary | ICD-10-CM | POA: Diagnosis not present

## 2019-07-02 DIAGNOSIS — R262 Difficulty in walking, not elsewhere classified: Secondary | ICD-10-CM | POA: Diagnosis not present

## 2019-07-02 DIAGNOSIS — L821 Other seborrheic keratosis: Secondary | ICD-10-CM | POA: Diagnosis not present

## 2019-07-02 DIAGNOSIS — G4733 Obstructive sleep apnea (adult) (pediatric): Secondary | ICD-10-CM | POA: Diagnosis not present

## 2019-07-02 DIAGNOSIS — M6281 Muscle weakness (generalized): Secondary | ICD-10-CM | POA: Diagnosis not present

## 2019-07-02 DIAGNOSIS — M1611 Unilateral primary osteoarthritis, right hip: Secondary | ICD-10-CM | POA: Diagnosis not present

## 2019-07-05 DIAGNOSIS — E669 Obesity, unspecified: Secondary | ICD-10-CM | POA: Diagnosis not present

## 2019-07-05 DIAGNOSIS — Z6832 Body mass index (BMI) 32.0-32.9, adult: Secondary | ICD-10-CM | POA: Diagnosis not present

## 2019-07-08 DIAGNOSIS — M6281 Muscle weakness (generalized): Secondary | ICD-10-CM | POA: Diagnosis not present

## 2019-07-08 DIAGNOSIS — M1611 Unilateral primary osteoarthritis, right hip: Secondary | ICD-10-CM | POA: Diagnosis not present

## 2019-07-08 DIAGNOSIS — R262 Difficulty in walking, not elsewhere classified: Secondary | ICD-10-CM | POA: Diagnosis not present

## 2019-07-20 DIAGNOSIS — Z6832 Body mass index (BMI) 32.0-32.9, adult: Secondary | ICD-10-CM | POA: Diagnosis not present

## 2019-07-20 DIAGNOSIS — Z7182 Exercise counseling: Secondary | ICD-10-CM | POA: Diagnosis not present

## 2019-07-20 DIAGNOSIS — E669 Obesity, unspecified: Secondary | ICD-10-CM | POA: Diagnosis not present

## 2019-08-06 DIAGNOSIS — Z6832 Body mass index (BMI) 32.0-32.9, adult: Secondary | ICD-10-CM | POA: Diagnosis not present

## 2019-08-06 DIAGNOSIS — Z713 Dietary counseling and surveillance: Secondary | ICD-10-CM | POA: Diagnosis not present

## 2019-08-06 DIAGNOSIS — E669 Obesity, unspecified: Secondary | ICD-10-CM | POA: Diagnosis not present

## 2019-08-17 DIAGNOSIS — L820 Inflamed seborrheic keratosis: Secondary | ICD-10-CM | POA: Diagnosis not present

## 2019-08-17 DIAGNOSIS — D225 Melanocytic nevi of trunk: Secondary | ICD-10-CM | POA: Diagnosis not present

## 2019-08-17 DIAGNOSIS — D2239 Melanocytic nevi of other parts of face: Secondary | ICD-10-CM | POA: Diagnosis not present

## 2019-08-17 DIAGNOSIS — L821 Other seborrheic keratosis: Secondary | ICD-10-CM | POA: Diagnosis not present

## 2019-08-17 DIAGNOSIS — D485 Neoplasm of uncertain behavior of skin: Secondary | ICD-10-CM | POA: Diagnosis not present

## 2019-08-20 DIAGNOSIS — G4733 Obstructive sleep apnea (adult) (pediatric): Secondary | ICD-10-CM | POA: Diagnosis not present

## 2019-08-20 DIAGNOSIS — E669 Obesity, unspecified: Secondary | ICD-10-CM | POA: Diagnosis not present

## 2019-08-20 DIAGNOSIS — Z6835 Body mass index (BMI) 35.0-35.9, adult: Secondary | ICD-10-CM | POA: Diagnosis not present

## 2019-08-31 DIAGNOSIS — L820 Inflamed seborrheic keratosis: Secondary | ICD-10-CM | POA: Diagnosis not present

## 2019-08-31 DIAGNOSIS — D485 Neoplasm of uncertain behavior of skin: Secondary | ICD-10-CM | POA: Diagnosis not present

## 2019-08-31 DIAGNOSIS — L988 Other specified disorders of the skin and subcutaneous tissue: Secondary | ICD-10-CM | POA: Diagnosis not present

## 2019-09-14 DIAGNOSIS — G4733 Obstructive sleep apnea (adult) (pediatric): Secondary | ICD-10-CM | POA: Diagnosis not present

## 2019-09-14 DIAGNOSIS — Z6830 Body mass index (BMI) 30.0-30.9, adult: Secondary | ICD-10-CM | POA: Diagnosis not present

## 2019-09-14 DIAGNOSIS — E669 Obesity, unspecified: Secondary | ICD-10-CM | POA: Diagnosis not present

## 2019-09-14 DIAGNOSIS — R635 Abnormal weight gain: Secondary | ICD-10-CM | POA: Diagnosis not present

## 2019-09-14 DIAGNOSIS — E785 Hyperlipidemia, unspecified: Secondary | ICD-10-CM | POA: Diagnosis not present

## 2019-09-20 DIAGNOSIS — G4733 Obstructive sleep apnea (adult) (pediatric): Secondary | ICD-10-CM | POA: Diagnosis not present

## 2019-09-28 DIAGNOSIS — E669 Obesity, unspecified: Secondary | ICD-10-CM | POA: Diagnosis not present

## 2019-09-28 DIAGNOSIS — I10 Essential (primary) hypertension: Secondary | ICD-10-CM | POA: Diagnosis not present

## 2019-09-28 DIAGNOSIS — E785 Hyperlipidemia, unspecified: Secondary | ICD-10-CM | POA: Diagnosis not present

## 2019-09-28 DIAGNOSIS — Z6831 Body mass index (BMI) 31.0-31.9, adult: Secondary | ICD-10-CM | POA: Diagnosis not present

## 2019-09-28 DIAGNOSIS — G4733 Obstructive sleep apnea (adult) (pediatric): Secondary | ICD-10-CM | POA: Diagnosis not present

## 2019-09-28 DIAGNOSIS — Z7182 Exercise counseling: Secondary | ICD-10-CM | POA: Diagnosis not present

## 2019-10-01 DIAGNOSIS — R7303 Prediabetes: Secondary | ICD-10-CM | POA: Diagnosis not present

## 2019-10-01 DIAGNOSIS — G4733 Obstructive sleep apnea (adult) (pediatric): Secondary | ICD-10-CM | POA: Diagnosis not present

## 2019-10-01 DIAGNOSIS — E785 Hyperlipidemia, unspecified: Secondary | ICD-10-CM | POA: Diagnosis not present

## 2019-10-01 DIAGNOSIS — I10 Essential (primary) hypertension: Secondary | ICD-10-CM | POA: Diagnosis not present

## 2019-10-01 DIAGNOSIS — M1712 Unilateral primary osteoarthritis, left knee: Secondary | ICD-10-CM | POA: Diagnosis not present

## 2019-10-01 DIAGNOSIS — E669 Obesity, unspecified: Secondary | ICD-10-CM | POA: Diagnosis not present

## 2019-10-01 DIAGNOSIS — K5901 Slow transit constipation: Secondary | ICD-10-CM | POA: Diagnosis not present

## 2019-10-05 DIAGNOSIS — G4733 Obstructive sleep apnea (adult) (pediatric): Secondary | ICD-10-CM | POA: Diagnosis not present

## 2019-10-05 DIAGNOSIS — Z9989 Dependence on other enabling machines and devices: Secondary | ICD-10-CM | POA: Diagnosis not present

## 2019-10-08 DIAGNOSIS — E669 Obesity, unspecified: Secondary | ICD-10-CM | POA: Diagnosis not present

## 2019-10-08 DIAGNOSIS — Z6832 Body mass index (BMI) 32.0-32.9, adult: Secondary | ICD-10-CM | POA: Diagnosis not present

## 2019-10-12 DIAGNOSIS — E669 Obesity, unspecified: Secondary | ICD-10-CM | POA: Diagnosis not present

## 2019-10-12 DIAGNOSIS — Z6830 Body mass index (BMI) 30.0-30.9, adult: Secondary | ICD-10-CM | POA: Diagnosis not present

## 2019-10-21 DIAGNOSIS — G4733 Obstructive sleep apnea (adult) (pediatric): Secondary | ICD-10-CM | POA: Diagnosis not present

## 2019-10-26 DIAGNOSIS — M1712 Unilateral primary osteoarthritis, left knee: Secondary | ICD-10-CM | POA: Diagnosis not present

## 2019-10-26 DIAGNOSIS — M1611 Unilateral primary osteoarthritis, right hip: Secondary | ICD-10-CM | POA: Diagnosis not present

## 2019-11-10 DIAGNOSIS — Z20822 Contact with and (suspected) exposure to covid-19: Secondary | ICD-10-CM | POA: Diagnosis not present

## 2019-11-10 DIAGNOSIS — Z03818 Encounter for observation for suspected exposure to other biological agents ruled out: Secondary | ICD-10-CM | POA: Diagnosis not present

## 2019-11-16 DIAGNOSIS — Z23 Encounter for immunization: Secondary | ICD-10-CM | POA: Diagnosis not present

## 2019-11-16 DIAGNOSIS — Z1389 Encounter for screening for other disorder: Secondary | ICD-10-CM | POA: Diagnosis not present

## 2019-11-16 DIAGNOSIS — Z0000 Encounter for general adult medical examination without abnormal findings: Secondary | ICD-10-CM | POA: Diagnosis not present

## 2019-11-18 DIAGNOSIS — G4733 Obstructive sleep apnea (adult) (pediatric): Secondary | ICD-10-CM | POA: Diagnosis not present

## 2019-11-20 DIAGNOSIS — G4733 Obstructive sleep apnea (adult) (pediatric): Secondary | ICD-10-CM | POA: Diagnosis not present

## 2019-11-30 DIAGNOSIS — G4733 Obstructive sleep apnea (adult) (pediatric): Secondary | ICD-10-CM | POA: Diagnosis not present

## 2019-12-02 DIAGNOSIS — H5202 Hypermetropia, left eye: Secondary | ICD-10-CM | POA: Diagnosis not present

## 2019-12-02 DIAGNOSIS — H2513 Age-related nuclear cataract, bilateral: Secondary | ICD-10-CM | POA: Diagnosis not present

## 2019-12-02 DIAGNOSIS — H52202 Unspecified astigmatism, left eye: Secondary | ICD-10-CM | POA: Diagnosis not present

## 2019-12-07 DIAGNOSIS — E669 Obesity, unspecified: Secondary | ICD-10-CM | POA: Diagnosis not present

## 2019-12-07 DIAGNOSIS — Z6830 Body mass index (BMI) 30.0-30.9, adult: Secondary | ICD-10-CM | POA: Diagnosis not present

## 2019-12-21 DIAGNOSIS — G4733 Obstructive sleep apnea (adult) (pediatric): Secondary | ICD-10-CM | POA: Diagnosis not present

## 2019-12-29 DIAGNOSIS — D225 Melanocytic nevi of trunk: Secondary | ICD-10-CM | POA: Diagnosis not present

## 2019-12-29 DIAGNOSIS — Z1283 Encounter for screening for malignant neoplasm of skin: Secondary | ICD-10-CM | POA: Diagnosis not present

## 2019-12-29 DIAGNOSIS — L308 Other specified dermatitis: Secondary | ICD-10-CM | POA: Diagnosis not present

## 2020-01-19 DIAGNOSIS — K5901 Slow transit constipation: Secondary | ICD-10-CM | POA: Diagnosis not present

## 2020-01-19 DIAGNOSIS — Z1211 Encounter for screening for malignant neoplasm of colon: Secondary | ICD-10-CM | POA: Diagnosis not present

## 2020-01-20 DIAGNOSIS — G4733 Obstructive sleep apnea (adult) (pediatric): Secondary | ICD-10-CM | POA: Diagnosis not present

## 2020-10-05 DIAGNOSIS — E785 Hyperlipidemia, unspecified: Secondary | ICD-10-CM | POA: Diagnosis not present

## 2020-10-05 DIAGNOSIS — K5901 Slow transit constipation: Secondary | ICD-10-CM | POA: Diagnosis not present

## 2020-10-05 DIAGNOSIS — R7303 Prediabetes: Secondary | ICD-10-CM | POA: Diagnosis not present

## 2020-10-05 DIAGNOSIS — I10 Essential (primary) hypertension: Secondary | ICD-10-CM | POA: Diagnosis not present

## 2020-10-05 DIAGNOSIS — G4733 Obstructive sleep apnea (adult) (pediatric): Secondary | ICD-10-CM | POA: Diagnosis not present

## 2020-10-08 DIAGNOSIS — L299 Pruritus, unspecified: Secondary | ICD-10-CM | POA: Diagnosis not present

## 2020-10-08 DIAGNOSIS — R21 Rash and other nonspecific skin eruption: Secondary | ICD-10-CM | POA: Diagnosis not present

## 2020-10-25 DIAGNOSIS — G4733 Obstructive sleep apnea (adult) (pediatric): Secondary | ICD-10-CM | POA: Diagnosis not present

## 2020-11-01 DIAGNOSIS — R509 Fever, unspecified: Secondary | ICD-10-CM | POA: Diagnosis not present

## 2020-11-01 DIAGNOSIS — R059 Cough, unspecified: Secondary | ICD-10-CM | POA: Diagnosis not present

## 2020-11-01 DIAGNOSIS — U071 COVID-19: Secondary | ICD-10-CM | POA: Diagnosis not present

## 2020-11-01 DIAGNOSIS — R52 Pain, unspecified: Secondary | ICD-10-CM | POA: Diagnosis not present

## 2020-11-11 DIAGNOSIS — K5909 Other constipation: Secondary | ICD-10-CM | POA: Diagnosis not present

## 2020-11-11 DIAGNOSIS — U071 COVID-19: Secondary | ICD-10-CM | POA: Diagnosis not present

## 2020-12-14 DIAGNOSIS — H52203 Unspecified astigmatism, bilateral: Secondary | ICD-10-CM | POA: Diagnosis not present

## 2020-12-14 DIAGNOSIS — H2513 Age-related nuclear cataract, bilateral: Secondary | ICD-10-CM | POA: Diagnosis not present

## 2020-12-14 DIAGNOSIS — H5212 Myopia, left eye: Secondary | ICD-10-CM | POA: Diagnosis not present

## 2021-01-25 DIAGNOSIS — G4733 Obstructive sleep apnea (adult) (pediatric): Secondary | ICD-10-CM | POA: Diagnosis not present

## 2021-02-20 DIAGNOSIS — Z1283 Encounter for screening for malignant neoplasm of skin: Secondary | ICD-10-CM | POA: Diagnosis not present

## 2021-02-20 DIAGNOSIS — L218 Other seborrheic dermatitis: Secondary | ICD-10-CM | POA: Diagnosis not present

## 2021-02-20 DIAGNOSIS — L821 Other seborrheic keratosis: Secondary | ICD-10-CM | POA: Diagnosis not present

## 2021-04-09 DIAGNOSIS — I10 Essential (primary) hypertension: Secondary | ICD-10-CM | POA: Diagnosis not present

## 2021-04-09 DIAGNOSIS — G4733 Obstructive sleep apnea (adult) (pediatric): Secondary | ICD-10-CM | POA: Diagnosis not present

## 2021-04-09 DIAGNOSIS — Z792 Long term (current) use of antibiotics: Secondary | ICD-10-CM | POA: Diagnosis not present

## 2021-04-09 DIAGNOSIS — E785 Hyperlipidemia, unspecified: Secondary | ICD-10-CM | POA: Diagnosis not present

## 2021-04-09 DIAGNOSIS — R7303 Prediabetes: Secondary | ICD-10-CM | POA: Diagnosis not present

## 2021-05-25 DIAGNOSIS — G4733 Obstructive sleep apnea (adult) (pediatric): Secondary | ICD-10-CM | POA: Diagnosis not present

## 2021-08-23 DIAGNOSIS — G4733 Obstructive sleep apnea (adult) (pediatric): Secondary | ICD-10-CM | POA: Diagnosis not present

## 2021-09-06 IMAGING — DX DG PORTABLE PELVIS
1 series · 1 of 1 positions shown · non-contrast
Comparison: Portable exam 4388 hours without priors for comparison

CLINICAL DATA: Postop RIGHT hip surgery

EXAM:
PORTABLE PELVIS 1-2 VIEWS

[pelvis ap]
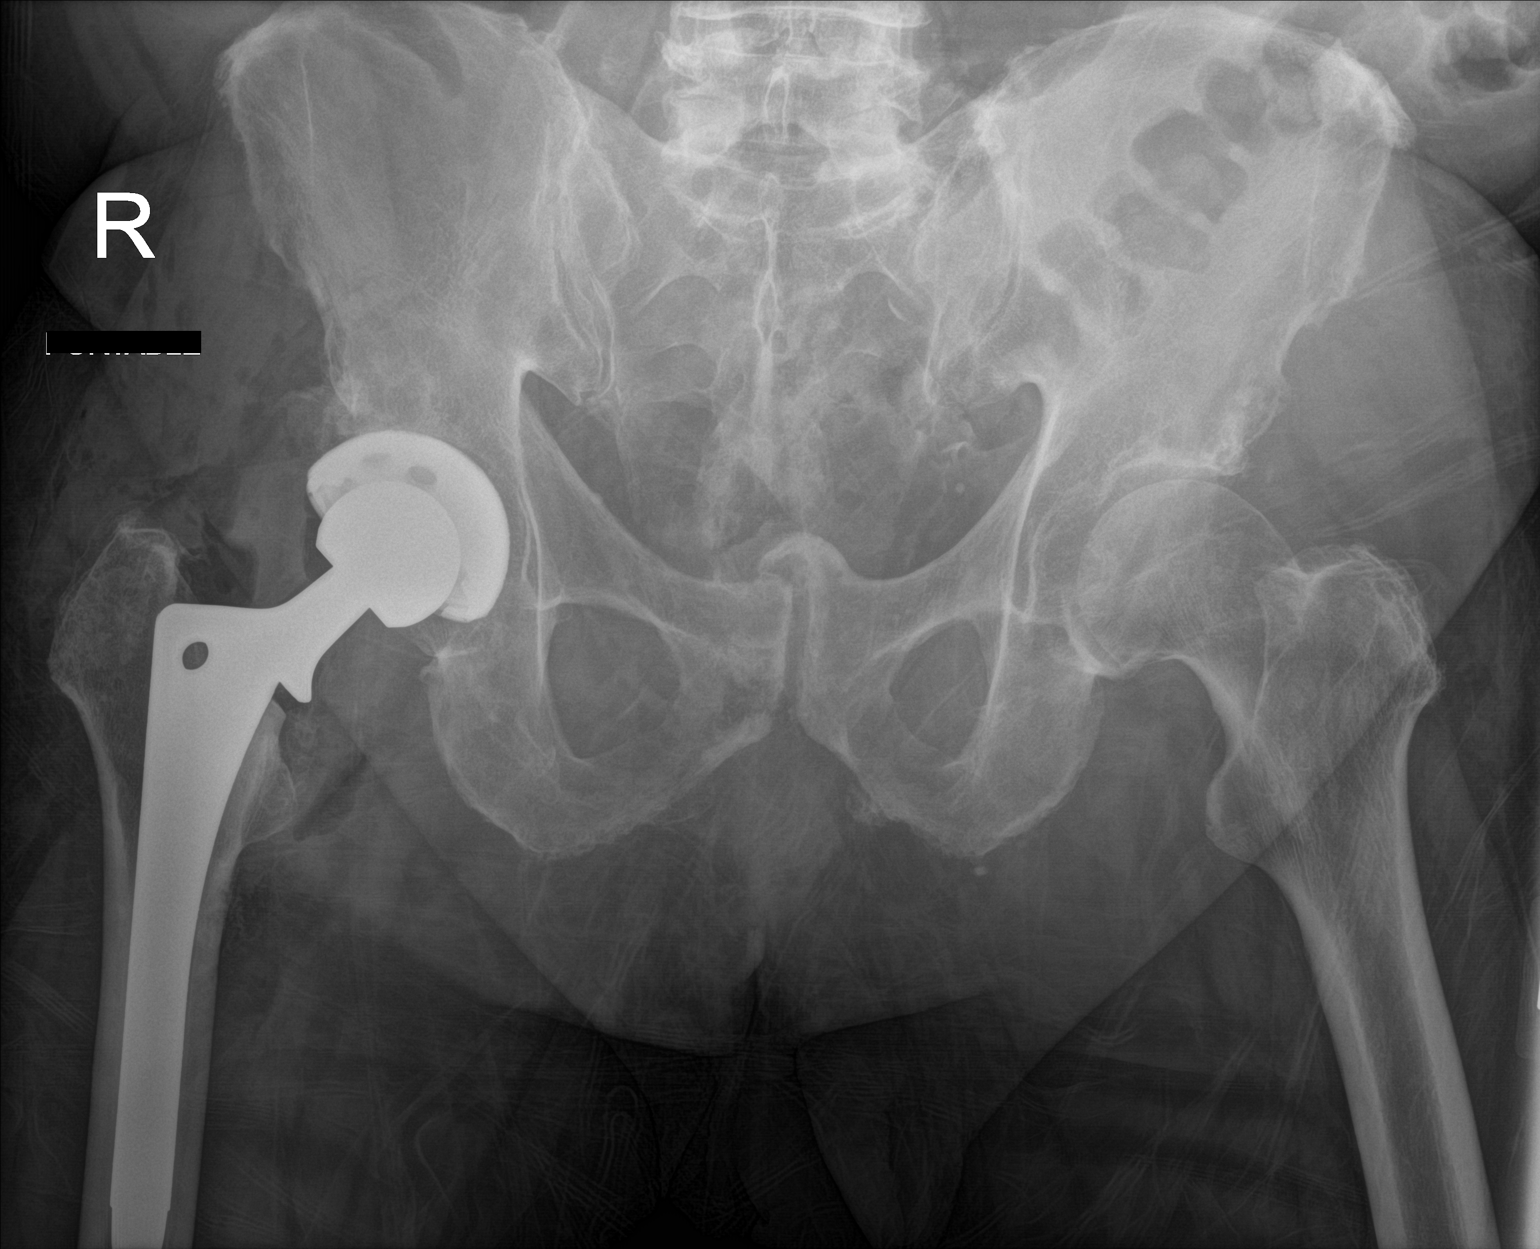

[1 of 1 positions shown; findings below may reference images not displayed]

FINDINGS: RIGHT hip prosthesis identified.

Tip of femoral component not imaged.

No acute fracture or dislocation.

Bones appear demineralized.

Spur formation at pubic symphysis.
IMPRESSION: RIGHT hip prosthesis without acute abnormalities.

## 2021-09-06 IMAGING — DX DG HIP (WITH OR WITHOUT PELVIS) 1V PORT*L*
1 series · 1 of 1 positions shown · non-contrast
Comparison: Portable exam 0901 hours without priors for comparison.

CLINICAL DATA: Post RIGHT hip surgery

EXAM:
DG HIP (WITH OR WITHOUT PELVIS) 1V PORT LEFT

[hip ap]
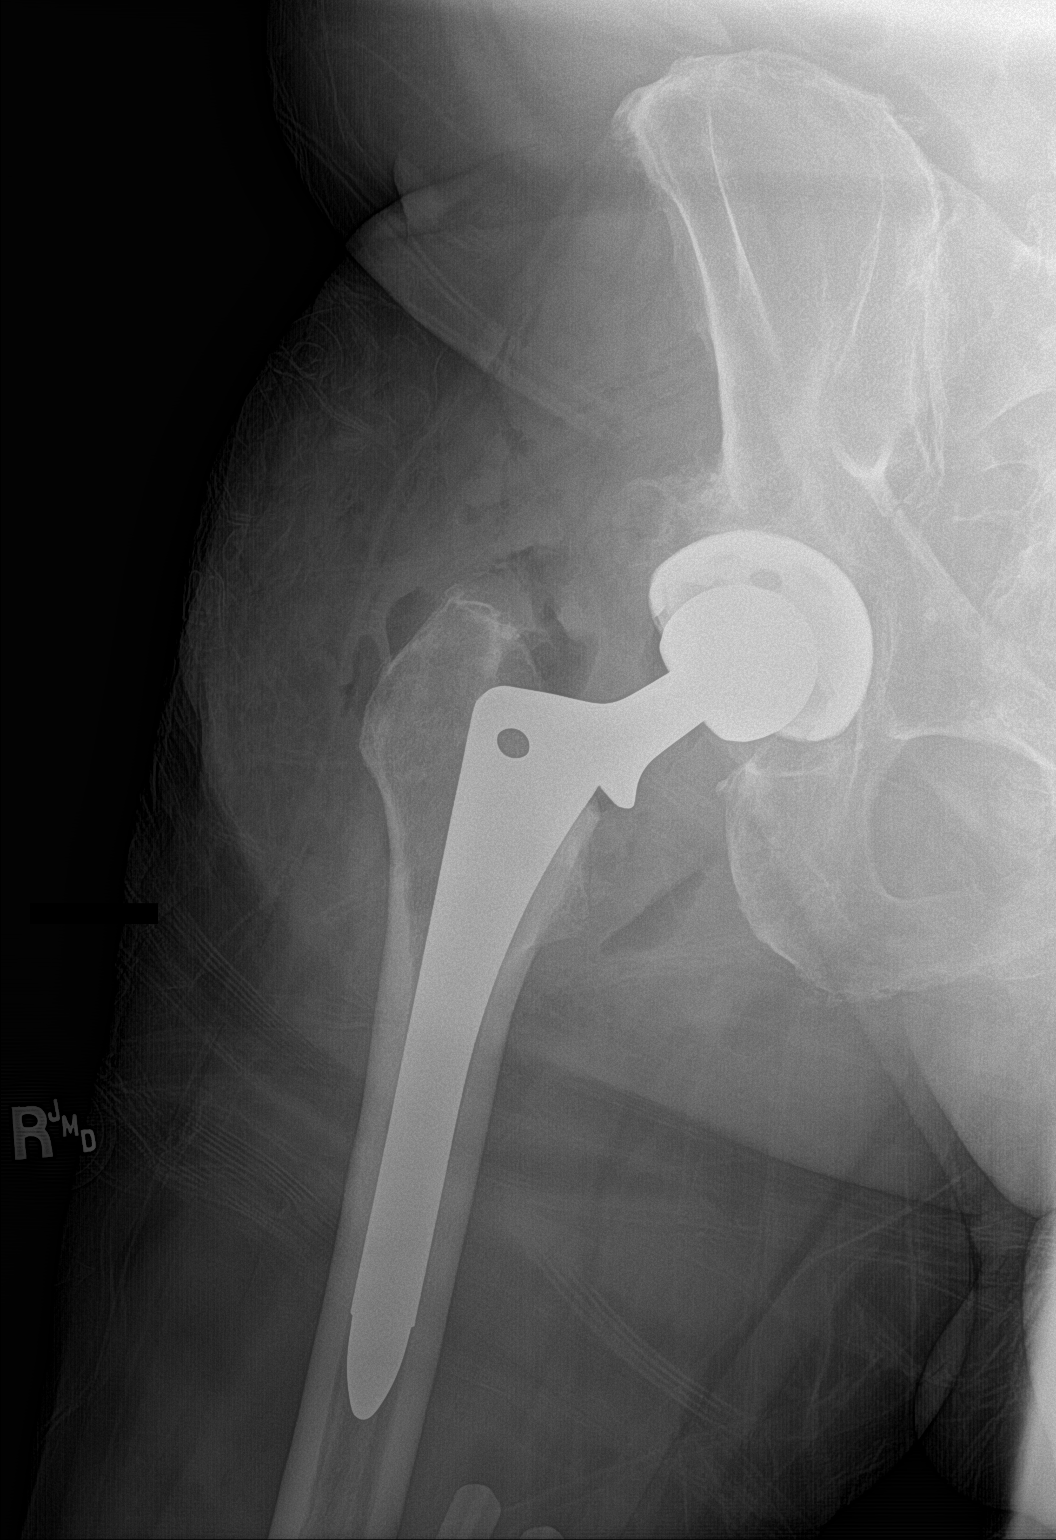

[1 of 1 positions shown; findings below may reference images not displayed]

FINDINGS: RIGHT hip prosthesis identified.

Osseous demineralization.

No fracture or dislocation identified on single AP view.
IMPRESSION: RIGHT hip prosthesis without acute abnormalities.

## 2021-11-21 DIAGNOSIS — G4733 Obstructive sleep apnea (adult) (pediatric): Secondary | ICD-10-CM | POA: Diagnosis not present

## 2022-02-19 DIAGNOSIS — G4733 Obstructive sleep apnea (adult) (pediatric): Secondary | ICD-10-CM | POA: Diagnosis not present

## 2022-02-21 DIAGNOSIS — H2513 Age-related nuclear cataract, bilateral: Secondary | ICD-10-CM | POA: Diagnosis not present

## 2022-02-21 DIAGNOSIS — H5203 Hypermetropia, bilateral: Secondary | ICD-10-CM | POA: Diagnosis not present

## 2022-02-21 DIAGNOSIS — H52203 Unspecified astigmatism, bilateral: Secondary | ICD-10-CM | POA: Diagnosis not present

## 2022-03-27 DIAGNOSIS — G4733 Obstructive sleep apnea (adult) (pediatric): Secondary | ICD-10-CM | POA: Diagnosis not present

## 2022-04-17 DIAGNOSIS — Z0000 Encounter for general adult medical examination without abnormal findings: Secondary | ICD-10-CM | POA: Diagnosis not present

## 2022-04-23 DIAGNOSIS — Z0000 Encounter for general adult medical examination without abnormal findings: Secondary | ICD-10-CM | POA: Diagnosis not present

## 2022-05-01 DIAGNOSIS — D225 Melanocytic nevi of trunk: Secondary | ICD-10-CM | POA: Diagnosis not present

## 2022-05-01 DIAGNOSIS — Z1283 Encounter for screening for malignant neoplasm of skin: Secondary | ICD-10-CM | POA: Diagnosis not present

## 2022-05-01 DIAGNOSIS — L218 Other seborrheic dermatitis: Secondary | ICD-10-CM | POA: Diagnosis not present

## 2022-06-25 DIAGNOSIS — G4733 Obstructive sleep apnea (adult) (pediatric): Secondary | ICD-10-CM | POA: Diagnosis not present

## 2022-07-30 DIAGNOSIS — R1012 Left upper quadrant pain: Secondary | ICD-10-CM | POA: Diagnosis not present

## 2022-07-30 DIAGNOSIS — K5900 Constipation, unspecified: Secondary | ICD-10-CM | POA: Diagnosis not present

## 2022-09-23 DIAGNOSIS — G4733 Obstructive sleep apnea (adult) (pediatric): Secondary | ICD-10-CM | POA: Diagnosis not present

## 2022-10-09 DIAGNOSIS — I10 Essential (primary) hypertension: Secondary | ICD-10-CM | POA: Diagnosis not present

## 2022-10-09 DIAGNOSIS — E785 Hyperlipidemia, unspecified: Secondary | ICD-10-CM | POA: Diagnosis not present

## 2022-10-09 DIAGNOSIS — R7303 Prediabetes: Secondary | ICD-10-CM | POA: Diagnosis not present

## 2022-12-11 DIAGNOSIS — M9903 Segmental and somatic dysfunction of lumbar region: Secondary | ICD-10-CM | POA: Diagnosis not present

## 2022-12-11 DIAGNOSIS — M5117 Intervertebral disc disorders with radiculopathy, lumbosacral region: Secondary | ICD-10-CM | POA: Diagnosis not present

## 2022-12-11 DIAGNOSIS — M5032 Other cervical disc degeneration, mid-cervical region, unspecified level: Secondary | ICD-10-CM | POA: Diagnosis not present

## 2022-12-11 DIAGNOSIS — M9901 Segmental and somatic dysfunction of cervical region: Secondary | ICD-10-CM | POA: Diagnosis not present

## 2022-12-11 DIAGNOSIS — M5137 Other intervertebral disc degeneration, lumbosacral region with discogenic back pain only: Secondary | ICD-10-CM | POA: Diagnosis not present

## 2022-12-11 DIAGNOSIS — M9904 Segmental and somatic dysfunction of sacral region: Secondary | ICD-10-CM | POA: Diagnosis not present

## 2022-12-11 DIAGNOSIS — M5416 Radiculopathy, lumbar region: Secondary | ICD-10-CM | POA: Diagnosis not present

## 2022-12-13 DIAGNOSIS — M9904 Segmental and somatic dysfunction of sacral region: Secondary | ICD-10-CM | POA: Diagnosis not present

## 2022-12-13 DIAGNOSIS — M5117 Intervertebral disc disorders with radiculopathy, lumbosacral region: Secondary | ICD-10-CM | POA: Diagnosis not present

## 2022-12-13 DIAGNOSIS — M9903 Segmental and somatic dysfunction of lumbar region: Secondary | ICD-10-CM | POA: Diagnosis not present

## 2022-12-13 DIAGNOSIS — M5137 Other intervertebral disc degeneration, lumbosacral region with discogenic back pain only: Secondary | ICD-10-CM | POA: Diagnosis not present

## 2022-12-13 DIAGNOSIS — M9901 Segmental and somatic dysfunction of cervical region: Secondary | ICD-10-CM | POA: Diagnosis not present

## 2022-12-13 DIAGNOSIS — M5032 Other cervical disc degeneration, mid-cervical region, unspecified level: Secondary | ICD-10-CM | POA: Diagnosis not present

## 2022-12-17 DIAGNOSIS — M5117 Intervertebral disc disorders with radiculopathy, lumbosacral region: Secondary | ICD-10-CM | POA: Diagnosis not present

## 2022-12-17 DIAGNOSIS — M5032 Other cervical disc degeneration, mid-cervical region, unspecified level: Secondary | ICD-10-CM | POA: Diagnosis not present

## 2022-12-17 DIAGNOSIS — M9901 Segmental and somatic dysfunction of cervical region: Secondary | ICD-10-CM | POA: Diagnosis not present

## 2022-12-17 DIAGNOSIS — M9904 Segmental and somatic dysfunction of sacral region: Secondary | ICD-10-CM | POA: Diagnosis not present

## 2022-12-17 DIAGNOSIS — M9903 Segmental and somatic dysfunction of lumbar region: Secondary | ICD-10-CM | POA: Diagnosis not present

## 2022-12-17 DIAGNOSIS — M5137 Other intervertebral disc degeneration, lumbosacral region with discogenic back pain only: Secondary | ICD-10-CM | POA: Diagnosis not present

## 2022-12-19 DIAGNOSIS — M9901 Segmental and somatic dysfunction of cervical region: Secondary | ICD-10-CM | POA: Diagnosis not present

## 2022-12-19 DIAGNOSIS — M9904 Segmental and somatic dysfunction of sacral region: Secondary | ICD-10-CM | POA: Diagnosis not present

## 2022-12-19 DIAGNOSIS — M9903 Segmental and somatic dysfunction of lumbar region: Secondary | ICD-10-CM | POA: Diagnosis not present

## 2022-12-19 DIAGNOSIS — M5117 Intervertebral disc disorders with radiculopathy, lumbosacral region: Secondary | ICD-10-CM | POA: Diagnosis not present

## 2022-12-19 DIAGNOSIS — M5137 Other intervertebral disc degeneration, lumbosacral region with discogenic back pain only: Secondary | ICD-10-CM | POA: Diagnosis not present

## 2022-12-19 DIAGNOSIS — M5032 Other cervical disc degeneration, mid-cervical region, unspecified level: Secondary | ICD-10-CM | POA: Diagnosis not present

## 2022-12-24 DIAGNOSIS — M9901 Segmental and somatic dysfunction of cervical region: Secondary | ICD-10-CM | POA: Diagnosis not present

## 2022-12-24 DIAGNOSIS — M5117 Intervertebral disc disorders with radiculopathy, lumbosacral region: Secondary | ICD-10-CM | POA: Diagnosis not present

## 2022-12-24 DIAGNOSIS — M5137 Other intervertebral disc degeneration, lumbosacral region with discogenic back pain only: Secondary | ICD-10-CM | POA: Diagnosis not present

## 2022-12-24 DIAGNOSIS — M9904 Segmental and somatic dysfunction of sacral region: Secondary | ICD-10-CM | POA: Diagnosis not present

## 2022-12-24 DIAGNOSIS — M9903 Segmental and somatic dysfunction of lumbar region: Secondary | ICD-10-CM | POA: Diagnosis not present

## 2022-12-24 DIAGNOSIS — M5032 Other cervical disc degeneration, mid-cervical region, unspecified level: Secondary | ICD-10-CM | POA: Diagnosis not present

## 2022-12-25 DIAGNOSIS — R195 Other fecal abnormalities: Secondary | ICD-10-CM | POA: Diagnosis not present

## 2022-12-25 DIAGNOSIS — R531 Weakness: Secondary | ICD-10-CM | POA: Diagnosis not present

## 2022-12-25 DIAGNOSIS — R634 Abnormal weight loss: Secondary | ICD-10-CM | POA: Diagnosis not present

## 2022-12-25 DIAGNOSIS — R140 Abdominal distension (gaseous): Secondary | ICD-10-CM | POA: Diagnosis not present

## 2022-12-26 DIAGNOSIS — M5032 Other cervical disc degeneration, mid-cervical region, unspecified level: Secondary | ICD-10-CM | POA: Diagnosis not present

## 2022-12-26 DIAGNOSIS — M9904 Segmental and somatic dysfunction of sacral region: Secondary | ICD-10-CM | POA: Diagnosis not present

## 2022-12-26 DIAGNOSIS — M9903 Segmental and somatic dysfunction of lumbar region: Secondary | ICD-10-CM | POA: Diagnosis not present

## 2022-12-26 DIAGNOSIS — M9901 Segmental and somatic dysfunction of cervical region: Secondary | ICD-10-CM | POA: Diagnosis not present

## 2022-12-26 DIAGNOSIS — M5137 Other intervertebral disc degeneration, lumbosacral region with discogenic back pain only: Secondary | ICD-10-CM | POA: Diagnosis not present

## 2022-12-26 DIAGNOSIS — M5117 Intervertebral disc disorders with radiculopathy, lumbosacral region: Secondary | ICD-10-CM | POA: Diagnosis not present

## 2022-12-31 DIAGNOSIS — M9904 Segmental and somatic dysfunction of sacral region: Secondary | ICD-10-CM | POA: Diagnosis not present

## 2022-12-31 DIAGNOSIS — M5117 Intervertebral disc disorders with radiculopathy, lumbosacral region: Secondary | ICD-10-CM | POA: Diagnosis not present

## 2022-12-31 DIAGNOSIS — M9903 Segmental and somatic dysfunction of lumbar region: Secondary | ICD-10-CM | POA: Diagnosis not present

## 2022-12-31 DIAGNOSIS — M5032 Other cervical disc degeneration, mid-cervical region, unspecified level: Secondary | ICD-10-CM | POA: Diagnosis not present

## 2022-12-31 DIAGNOSIS — M9901 Segmental and somatic dysfunction of cervical region: Secondary | ICD-10-CM | POA: Diagnosis not present

## 2022-12-31 DIAGNOSIS — M5137 Other intervertebral disc degeneration, lumbosacral region with discogenic back pain only: Secondary | ICD-10-CM | POA: Diagnosis not present

## 2023-01-03 DIAGNOSIS — M5032 Other cervical disc degeneration, mid-cervical region, unspecified level: Secondary | ICD-10-CM | POA: Diagnosis not present

## 2023-01-03 DIAGNOSIS — M5137 Other intervertebral disc degeneration, lumbosacral region with discogenic back pain only: Secondary | ICD-10-CM | POA: Diagnosis not present

## 2023-01-03 DIAGNOSIS — M9901 Segmental and somatic dysfunction of cervical region: Secondary | ICD-10-CM | POA: Diagnosis not present

## 2023-01-03 DIAGNOSIS — M9904 Segmental and somatic dysfunction of sacral region: Secondary | ICD-10-CM | POA: Diagnosis not present

## 2023-01-03 DIAGNOSIS — M5117 Intervertebral disc disorders with radiculopathy, lumbosacral region: Secondary | ICD-10-CM | POA: Diagnosis not present

## 2023-01-03 DIAGNOSIS — M9903 Segmental and somatic dysfunction of lumbar region: Secondary | ICD-10-CM | POA: Diagnosis not present

## 2023-01-09 DIAGNOSIS — M9904 Segmental and somatic dysfunction of sacral region: Secondary | ICD-10-CM | POA: Diagnosis not present

## 2023-01-09 DIAGNOSIS — M5137 Other intervertebral disc degeneration, lumbosacral region with discogenic back pain only: Secondary | ICD-10-CM | POA: Diagnosis not present

## 2023-01-09 DIAGNOSIS — M5117 Intervertebral disc disorders with radiculopathy, lumbosacral region: Secondary | ICD-10-CM | POA: Diagnosis not present

## 2023-01-09 DIAGNOSIS — M5032 Other cervical disc degeneration, mid-cervical region, unspecified level: Secondary | ICD-10-CM | POA: Diagnosis not present

## 2023-01-09 DIAGNOSIS — M9903 Segmental and somatic dysfunction of lumbar region: Secondary | ICD-10-CM | POA: Diagnosis not present

## 2023-01-09 DIAGNOSIS — M9901 Segmental and somatic dysfunction of cervical region: Secondary | ICD-10-CM | POA: Diagnosis not present

## 2023-01-13 DIAGNOSIS — R194 Change in bowel habit: Secondary | ICD-10-CM | POA: Diagnosis not present

## 2023-01-13 DIAGNOSIS — R634 Abnormal weight loss: Secondary | ICD-10-CM | POA: Diagnosis not present

## 2023-01-14 ENCOUNTER — Other Ambulatory Visit (HOSPITAL_COMMUNITY): Payer: Self-pay | Admitting: Gastroenterology

## 2023-01-14 DIAGNOSIS — R1084 Generalized abdominal pain: Secondary | ICD-10-CM

## 2023-01-14 DIAGNOSIS — R634 Abnormal weight loss: Secondary | ICD-10-CM

## 2023-01-15 DIAGNOSIS — M5117 Intervertebral disc disorders with radiculopathy, lumbosacral region: Secondary | ICD-10-CM | POA: Diagnosis not present

## 2023-01-15 DIAGNOSIS — M5137 Other intervertebral disc degeneration, lumbosacral region with discogenic back pain only: Secondary | ICD-10-CM | POA: Diagnosis not present

## 2023-01-15 DIAGNOSIS — M9904 Segmental and somatic dysfunction of sacral region: Secondary | ICD-10-CM | POA: Diagnosis not present

## 2023-01-15 DIAGNOSIS — M9903 Segmental and somatic dysfunction of lumbar region: Secondary | ICD-10-CM | POA: Diagnosis not present

## 2023-01-15 DIAGNOSIS — M5032 Other cervical disc degeneration, mid-cervical region, unspecified level: Secondary | ICD-10-CM | POA: Diagnosis not present

## 2023-01-15 DIAGNOSIS — M9901 Segmental and somatic dysfunction of cervical region: Secondary | ICD-10-CM | POA: Diagnosis not present

## 2023-01-20 DIAGNOSIS — K319 Disease of stomach and duodenum, unspecified: Secondary | ICD-10-CM | POA: Diagnosis not present

## 2023-01-20 DIAGNOSIS — R1084 Generalized abdominal pain: Secondary | ICD-10-CM | POA: Diagnosis not present

## 2023-01-20 DIAGNOSIS — R634 Abnormal weight loss: Secondary | ICD-10-CM | POA: Diagnosis not present

## 2023-01-20 DIAGNOSIS — K2930 Chronic superficial gastritis without bleeding: Secondary | ICD-10-CM | POA: Diagnosis not present

## 2023-01-23 DIAGNOSIS — M5117 Intervertebral disc disorders with radiculopathy, lumbosacral region: Secondary | ICD-10-CM | POA: Diagnosis not present

## 2023-01-23 DIAGNOSIS — M5032 Other cervical disc degeneration, mid-cervical region, unspecified level: Secondary | ICD-10-CM | POA: Diagnosis not present

## 2023-01-23 DIAGNOSIS — M9901 Segmental and somatic dysfunction of cervical region: Secondary | ICD-10-CM | POA: Diagnosis not present

## 2023-01-23 DIAGNOSIS — M9904 Segmental and somatic dysfunction of sacral region: Secondary | ICD-10-CM | POA: Diagnosis not present

## 2023-01-23 DIAGNOSIS — M9903 Segmental and somatic dysfunction of lumbar region: Secondary | ICD-10-CM | POA: Diagnosis not present

## 2023-01-23 DIAGNOSIS — M5137 Other intervertebral disc degeneration, lumbosacral region with discogenic back pain only: Secondary | ICD-10-CM | POA: Diagnosis not present

## 2023-01-28 ENCOUNTER — Ambulatory Visit (HOSPITAL_COMMUNITY)
Admission: RE | Admit: 2023-01-28 | Discharge: 2023-01-28 | Disposition: A | Discharge status: Home / Self Care | Payer: Medicare Other | Source: Ambulatory Visit | Attending: Gastroenterology | Admitting: Gastroenterology

## 2023-01-28 DIAGNOSIS — R161 Splenomegaly, not elsewhere classified: Secondary | ICD-10-CM | POA: Diagnosis not present

## 2023-01-28 DIAGNOSIS — K8689 Other specified diseases of pancreas: Secondary | ICD-10-CM | POA: Diagnosis not present

## 2023-01-28 DIAGNOSIS — R1084 Generalized abdominal pain: Secondary | ICD-10-CM

## 2023-01-28 DIAGNOSIS — R634 Abnormal weight loss: Secondary | ICD-10-CM

## 2023-01-28 DIAGNOSIS — K5730 Diverticulosis of large intestine without perforation or abscess without bleeding: Secondary | ICD-10-CM | POA: Diagnosis not present

## 2023-01-28 MED ORDER — IOHEXOL 350 MG/ML SOLN
75.0000 mL | Freq: Once | INTRAVENOUS | Status: AC | PRN
  Administered 2023-01-28: 75 mL via INTRAVENOUS

## 2023-02-03 ENCOUNTER — Encounter: Payer: Self-pay | Admitting: *Deleted

## 2023-02-03 ENCOUNTER — Other Ambulatory Visit: Payer: Self-pay | Admitting: *Deleted

## 2023-02-03 DIAGNOSIS — R1907 Generalized intra-abdominal and pelvic swelling, mass and lump: Secondary | ICD-10-CM

## 2023-02-03 NOTE — Progress Notes (Signed)
CT guided biopsy of abdominal mass ordered per Dr Truett Perna.

## 2023-02-03 NOTE — Progress Notes (Unsigned)
CT guided biopsy ordered of abdominal mass per Dr Truett Perna

## 2023-02-03 NOTE — Progress Notes (Signed)
PATIENT NAVIGATOR PROGRESS NOTE  Name: Ralph Davis Date: 02/03/2023 MRN: [id number]  DOB: 06-20-1945   Reason for visit:  New patient appt  Comments:  Called and spoke with [name] and discussed referral from Dr Bosie Clos.  New pt appt made for 02/05/23 at 1:45 with [name] NP. Discussed directions to building and parking as well as contact information.  Spoke with Dr Marge Duncans office earlier and they do not have another procedure planned at this time.  Informed [name] that we may need to get additional tissue for diagnosis and verbalized understanding      Time spent counseling/coordinating care: 45-60 minutes

## 2023-02-04 NOTE — Progress Notes (Signed)
Gilmer Mor, DO  Claudean Kinds OK for CT guided biopsy (possible aspiration) of right abdominal mass. Image 23/97 series 3 of CT 01/28/23.  Upper endo done and took gastric mucosa bx ([facility]). These were neg.  I do not think that endoscopic US biopsy was done. I do not see labs for ? Pancreatitis, and I see no CA-19-9  [address]       Previous Messages    ----- Message ----- From: Claudean Kinds Sent: 02/03/2023   2:10 PM EST To: Claudean Kinds; Ir Procedure Requests Subject: CT Biopsy                                      Procedure: CT Biopsy  Reason: CT guided biopsy of abdominal mass Dx: Generalized abdominal mass [R19.07 (ICD-10-CM)]    History: Ct abd pelv w/  Provider: Ladene Artist, MD  Provider Contact :  [phone]

## 2023-02-05 ENCOUNTER — Encounter: Payer: Self-pay | Admitting: Nurse Practitioner

## 2023-02-05 ENCOUNTER — Encounter: Payer: Self-pay | Admitting: *Deleted

## 2023-02-05 ENCOUNTER — Inpatient Hospital Stay: Payer: Medicare Other | Attending: Nurse Practitioner | Admitting: Nurse Practitioner

## 2023-02-05 VITALS — BP 103/66 | HR 96 | Temp 97.9°F | Resp 18 | Ht 69.0 in | Wt 164.2 lb

## 2023-02-05 DIAGNOSIS — R1900 Intra-abdominal and pelvic swelling, mass and lump, unspecified site: Secondary | ICD-10-CM

## 2023-02-05 DIAGNOSIS — I82890 Acute embolism and thrombosis of other specified veins: Secondary | ICD-10-CM | POA: Insufficient documentation

## 2023-02-05 DIAGNOSIS — K8689 Other specified diseases of pancreas: Secondary | ICD-10-CM | POA: Diagnosis not present

## 2023-02-05 DIAGNOSIS — M542 Cervicalgia: Secondary | ICD-10-CM | POA: Diagnosis not present

## 2023-02-05 DIAGNOSIS — I10 Essential (primary) hypertension: Secondary | ICD-10-CM | POA: Insufficient documentation

## 2023-02-05 DIAGNOSIS — Z5111 Encounter for antineoplastic chemotherapy: Secondary | ICD-10-CM | POA: Insufficient documentation

## 2023-02-05 DIAGNOSIS — C258 Malignant neoplasm of overlapping sites of pancreas: Secondary | ICD-10-CM | POA: Insufficient documentation

## 2023-02-05 DIAGNOSIS — C7989 Secondary malignant neoplasm of other specified sites: Secondary | ICD-10-CM | POA: Insufficient documentation

## 2023-02-05 MED ORDER — HYDROCODONE-ACETAMINOPHEN 5-325 MG PO TABS: 1.0000 | ORAL_TABLET | Freq: Four times a day (QID) | ORAL | 0 refills | Status: DC | PRN

## 2023-02-05 NOTE — H&P (Addendum)
Chief Complaint: Patient was seen in consultation today for CT- guided right abdominal mass biopsy for malignancy evaluation, at the request of Dr. Ladene Artist  Supervising Physician: Arbutus Nelligan, Mauri Reading  Patient Status: [facility] - Out-pt  History of Present Illness: Ralph Davis is a 77 y.o. male with PMHx of HTN, OSA, and arthritis.  Per review of external chart, patient was referred by PCP to Dr. Truett Perna for evaluation of unintentional weight loss (20lbs since 8/24), poor appetite, lethargy, constipation, and abdominal bloating and pain for greater than 2 months. Workup and labs at PCP unremarkable. Last colonoscopy in 2022 notable for removal of tubular adenoma. Upper endoscopy was performed [facility]) with gastric mucosa returning benign.  CT A/P w/ contrast on 12/10: IMPRESSION: -10 cm necrotic mass involving the pancreatic body and tail, highly suspicious for pancreatic carcinoma. This mass encases the splenic artery and causes splenic vein thrombosis. -Necrotic peripancreatic lymph node, and several necrotic masses in left upper quadrant, consistent with metastatic disease. -No evidence of pelvic metastatic disease. -Colonic diverticulosis, without radiographic evidence of diverticulitis. -Mildly enlarged prostate.  Interventional Radiology was requested for right abdominal mass biopsy. Request was reviewed and approved by Dr. Loreta Ave. Patient is scheduled for same in IR today.  All labs and medications are within acceptable parameters. No pertinent allergies. Patient has been NPO since midnight.    Currently without any significant complaints. Patient alert and laying in bed,calm. Denies any fevers, headache, chest pain, SOB, cough, abdominal pain, nausea, vomiting or bleeding.    Return precautions and treatment recommendations and follow-up discussed with the patient who is agreeable with the plan.   Past Medical History:  Diagnosis Date   Arthritis    Hypertension     Pneumonia    Sleep apnea     Past Surgical History:  Procedure Laterality Date   KNEE ARTHROPLASTY Left    TOTAL HIP ARTHROPLASTY Right 05/07/2019   Procedure: TOTAL HIP ARTHROPLASTY;  Surgeon: Frederico Hamman, MD;  Location: [facility];  Service: Orthopedics;  Laterality: Right;   VASECTOMY  1970   VASECTOMY REVERSAL      Allergies: Patient has no known allergies.  Medications: Prior to Admission medications   Medication Sig Start Date End Date Taking? Authorizing Provider  acetaminophen (TYLENOL) 650 MG CR tablet Take 1,300 mg by mouth 2 (two) times daily as needed for pain.    [provider]  celecoxib (CELEBREX) 200 MG capsule Take 1 capsule (200 mg total) by mouth 2 (two) times daily. 05/07/19   Chadwell, Ivin Booty, PA-C  Coenzyme Q10 300 MG CAPS Take 300 mg by mouth daily.    [provider]  gabapentin (NEURONTIN) 300 MG capsule Take 1 capsule (300 mg total) by mouth 3 (three) times daily for 14 days, THEN 1 capsule (300 mg total) 2 (two) times daily for 3 days, THEN 1 capsule (300 mg total) daily for 4 days. 05/07/19 05/28/19  Chadwell, Ivin Booty, PA-C  Glucosamine-Chondroit-Vit C-Mn (GLUCOSAMINE CHONDR 1500 COMPLX) CAPS Take 1 capsule by mouth daily.    [provider]  hydrochlorothiazide (HYDRODIURIL) 25 MG tablet Take 25 mg by mouth daily. 02/25/19   [provider]  lisinopril (ZESTRIL) 40 MG tablet Take 40 mg by mouth daily. 02/25/19   [provider]  Multiple Vitamin (MULTIVITAMIN WITH MINERALS) TABS tablet Take 1 tablet by mouth daily.    [provider]  Naphazoline HCl (CLEAR EYES OP) Place 2 drops into both eyes daily as needed (dry eyes).     [provider]  Omega-3 1400 MG CAPS Take 1,400 mg by mouth daily.    [provider]  oxyCODONE (OXY IR/ROXICODONE) 5 MG immediate release tablet Take one tab po q4-6hrs prn pain, may need 1-2 first couple weeks 05/07/19   Chadwell, Ivin Booty, PA-C  Probiotic CAPS Take 2  capsules by mouth daily.    [provider]  rosuvastatin (CRESTOR) 5 MG tablet Take 5 mg by mouth daily.    [provider]  TURMERIC PO Take 2,000 mg by mouth daily.    [provider]     History reviewed. No pertinent family history.  Social History   Socioeconomic History   Marital status: Married    Spouse name: Not on file   Number of children: Not on file   Years of education: Not on file   Highest education level: Not on file  Occupational History   Not on file  Tobacco Use   Smoking status: Former    Current packs/day: 0.00    Types: Cigarettes    Quit date: 58    Years since quitting: 48.0   Smokeless tobacco: Never  Vaping Use   Vaping status: Never Used  Substance and Sexual Activity   Alcohol use: Not Currently    Comment: occas.   Drug use: Never   Sexual activity: Not on file  Other Topics Concern   Not on file  Social History Narrative   Retired from Education officer, environmental at corporation   Social Drivers of [employer]: Low Risk  (02/05/2023)   Overall Financial Resource Strain (CARDIA)    Difficulty of Paying Living Expenses: Not hard at all  Food Insecurity: No Food Insecurity (02/05/2023)   Hunger Vital Sign    Worried About Running Out of Food in the Last Year: Never true    Ran Out of Food in the Last Year: Never true  Transportation Needs: No Transportation Needs (02/05/2023)   PRAPARE - Administrator, Civil Service (Medical): No    Lack of Transportation (Non-Medical): No  Physical Activity: Inactive (02/05/2023)   Exercise Vital Sign    Days of Exercise per Week: 0 days    Minutes of Exercise per Session: 0 min  Stress: No Stress Concern Present (02/05/2023)   [employer] of Occupational Health - Occupational Stress Questionnaire    Feeling of Stress : Not at all  Social Connections: Socially Integrated (02/05/2023)   Social Connection and Isolation Panel [NHANES]    Frequency of  Communication with Friends and Family: Three times a week    Frequency of Social Gatherings with Friends and Family: Three times a week    Attends Religious Services: More than 4 times per year    Active Member of Clubs or Organizations: Yes    Attends Banker Meetings: More than 4 times per year    Marital Status: Married    Review of Systems: A 12 point ROS discussed and pertinent positives are indicated in the HPI above.  All other systems are negative.  Review of Systems  Constitutional:  Positive for appetite change, fatigue and unexpected weight change. Negative for activity change, chills and fever.  Respiratory:  Negative for cough, chest tightness, shortness of breath and wheezing.   Cardiovascular:  Negative for chest pain.  Gastrointestinal:  Positive for abdominal pain and constipation.  Skin:  Negative for color change.  Psychiatric/Behavioral:  Negative for behavioral problems and confusion.     Vital Signs: BP  131/78   Pulse 84   Temp 97.8 F (36.6 C) (Oral)   Resp 18   Ht 5\' 8"  (1.727 m)   Wt 166 lb (75.3 kg)   SpO2 96%   BMI 25.24 kg/m   Advance Care Plan: The advanced care plan/surrogate decision maker was discussed at the time of visit and documented in the medical record.    Physical Exam Constitutional:      General: He is not in acute distress.    Appearance: Normal appearance.  HENT:     Mouth/Throat:     Mouth: Mucous membranes are moist.  Cardiovascular:     Rate and Rhythm: Normal rate and regular rhythm.     Pulses: Normal pulses.     Heart sounds: Normal heart sounds. No murmur heard. Pulmonary:     Effort: Pulmonary effort is normal. No respiratory distress.     Breath sounds: Normal breath sounds. No wheezing.  Abdominal:     General: Abdomen is flat.     Palpations: Abdomen is soft.     Tenderness: There is no abdominal tenderness.  Musculoskeletal:        General: Normal range of motion.  Skin:    General: Skin is  warm and dry.  Neurological:     Mental Status: He is alert and oriented to person, place, and time.  Psychiatric:        Mood and Affect: Mood normal.        Behavior: Behavior normal.        Thought Content: Thought content normal.        Judgment: Judgment normal.     Imaging: CT ABDOMEN PELVIS W CONTRAST Result Date: 01/29/2023 CLINICAL DATA:  Generalized abdominal pain. Unintentional weight loss. * Tracking Code: BO * EXAM: CT ABDOMEN AND PELVIS WITH CONTRAST TECHNIQUE: Multidetector CT imaging of the abdomen and pelvis was performed using the standard protocol following bolus administration of intravenous contrast. RADIATION DOSE REDUCTION: This exam was performed according to the departmental dose-optimization program which includes automated exposure control, adjustment of the mA and/or kV according to patient size and/or use of iterative reconstruction technique. CONTRAST:  75mL OMNIPAQUE IOHEXOL 350 MG/ML SOLN COMPARISON:  None Available. FINDINGS: Lower Chest: No acute findings. Hepatobiliary: Multiple hepatic cysts are noted. No suspicious hepatic masses identified. Gallbladder is unremarkable. No evidence of biliary ductal dilatation. Pancreas: Necrotic mass with peripheral enhancement is seen involving the pancreatic body and tail which measures 10.1 x 6.3 cm, highly suspicious for pancreatic carcinoma. This mass encases the splenic artery and causes splenic vein thrombosis. No evidence of vascular involvement involving the celiac axis, superior mesenteric artery or veins, or portal vein. Spleen: Within normal limits in size and appearance. Adrenals/Urinary Tract: No suspicious masses identified. No evidence of ureteral calculi or hydronephrosis. Stomach/Bowel: No evidence of obstruction, inflammatory process or abnormal fluid collections. Diverticulosis is seen mainly involving the descending and sigmoid colon, however there is no evidence of diverticulitis. Vascular/Lymphatic: Necrotic  peripancreatic lymph node is seen along the anterior aspect of the pancreatic body measuring 2.4 x 1.9 cm on image 22/3. Multiple left upper quadrant necrotic masses are seen in the gastrosplenic ligament and adjacent to the splenic flexure of the colon, ranging in size from 2.3 cm 24.1 cm in maximum diameter. These findings are consistent with metastatic disease. Reproductive:  Mildly enlarged prostate. Other:  None. Musculoskeletal: No suspicious bone lesions identified. Right hip prosthesis. IMPRESSION: 10 cm necrotic mass involving the pancreatic body and tail, highly  suspicious for pancreatic carcinoma. This mass encases the splenic artery and causes splenic vein thrombosis. Necrotic peripancreatic lymph node, and several necrotic masses in left upper quadrant, consistent with metastatic disease. No evidence of pelvic metastatic disease. Colonic diverticulosis, without radiographic evidence of diverticulitis. Mildly enlarged prostate. Electronically Signed   By: Danae Orleans M.D.   On: 01/29/2023 16:23    Labs:  CBC: Recent Labs    02/06/23 0824 02/07/23 1252  WBC 10.4 11.5*  HGB 12.5* 12.4*  HCT 39.8 39.7  PLT 180 189    COAGS: Recent Labs    02/07/23 1252  INR 1.1    BMP: Recent Labs    02/06/23 0824  NA 138  K 4.3  CL 105  CO2 28  GLUCOSE 140*  BUN 23  CALCIUM 9.9  CREATININE 0.69  GFRNONAA >60    LIVER FUNCTION TESTS: Recent Labs    02/06/23 0824  BILITOT 0.5  AST 14*  ALT 14  ALKPHOS 75  PROT 7.5  ALBUMIN 4.0    TUMOR MARKERS: Recent Labs    02/06/23 0824  CEA 75.60*    Assessment and Plan: Per review of external chart, patient was referred by PCP to Dr. Truett Perna for evaluation of unintentional weight loss (20lbs since 8/24), poor appetite, lethargy, constipation, and abdominal bloating and pain for greater than 2 months. Workup and labs at PCP unremarkable. Last colonoscopy in 2022 notable for removal of tubular adenoma. Upper endoscopy was  performed [facility]) with gastric mucosa returning benign. CT A/P w/ contrast on 12/10 notable for 10 cm necrotic mass involving the pancreatic body and tail, highly suspicious for pancreatic carcinoma.   Patient presents for scheduled left abdominal mass biopsy in IR today.  Risks and benefits of abdominal mass biopsy was discussed with the patient and/or patient's family including, but not limited to bleeding, infection, damage to adjacent structures or low yield requiring additional tests.  All of the questions were answered and there is agreement to proceed.  Consent signed and in chart.   Thank you for this interesting consult.  I greatly enjoyed meeting Carsin Giannini and look forward to participating in their care.  A copy of this report was sent to the requesting provider on this date.  Electronically Signed: Sable Feil, PA-C 02/07/2023, 1:39 PM   I spent a total of  30 Minutes   in face to face in clinical consultation, greater than 50% of which was counseling/coordinating care for CT- guided right abdominal mass biopsy.  Agree with note and plan of care.  Mauri Reading [place], MD [phone]

## 2023-02-05 NOTE — Progress Notes (Signed)
New Hematology/Oncology Consult   Requesting MD: Dr. Charlott Rakes  [phone]      Reason for Consult: Pancreatic mass  HPI: Ralph Davis is a 77 year old man referred for evaluation of a pancreatic mass.  He was seen by Dr. Bosie Clos 01/13/2023 with abdominal bloating, constipation, weight loss of about 52-months duration.  EGD on 01/20/2023 showed diffuse moderate mucosal changes characterized by congestion and erythema in the gastric antrum; a large ulcerated and fungating mass with stigmata of recent bleeding in the duodenal bulb; segmental moderate inflammation characterized by congestion and erythema found at the pylorus.  There was no evidence of dysplasia or malignancy in the duodenum biopsies.  CT abdomen/pelvis 01/28/2023 showed a necrotic mass with peripheral enhancement involving the pancreatic body and tail measuring 10.1 x 6.3 cm; mass encasing the splenic artery and causes splenic vein thrombosis; no evidence of vascular involvement of the celiac axis, superior mesenteric artery or veins or portal vein; necrotic peripancreatic lymph node seen along the anterior aspect of the pancreatic body measuring 2.4 x 1.9 cm; multiple left upper quadrant necrotic masses seen in the gastrosplenic ligament and adjacent to the splenic flexure of the colon.     Past Medical History:  Diagnosis Date   Arthritis    Hypertension    Pneumonia    Sleep apnea      Past Surgical History:  Procedure Laterality Date   KNEE ARTHROPLASTY Left    TOTAL HIP ARTHROPLASTY Right 05/07/2019   Procedure: TOTAL HIP ARTHROPLASTY;  Surgeon: Frederico Hamman, MD;  Location: [facility];  Service: Orthopedics;  Laterality: Right;   VASECTOMY  1970   VASECTOMY REVERSAL       Current Outpatient Medications:    celecoxib (CELEBREX) 200 MG capsule, Take 1 capsule (200 mg total) by mouth 2 (two) times daily., Disp: 30 capsule, Rfl: 1   Coenzyme Q10 300 MG CAPS, Take 300 mg by mouth daily., Disp: , Rfl:     Glucosamine-Chondroit-Vit C-Mn (GLUCOSAMINE CHONDR 1500 COMPLX) CAPS, Take 1 capsule by mouth daily., Disp: , Rfl:    hydrochlorothiazide (HYDRODIURIL) 25 MG tablet, Take 25 mg by mouth daily., Disp: , Rfl:    ibuprofen (ADVIL) 600 MG tablet, Take 600 mg by mouth every 6 (six) hours as needed., Disp: , Rfl:    lisinopril (ZESTRIL) 40 MG tablet, Take 40 mg by mouth daily., Disp: , Rfl:    Multiple Vitamin (MULTIVITAMIN WITH MINERALS) TABS tablet, Take 1 tablet by mouth daily., Disp: , Rfl:    Naphazoline HCl (CLEAR EYES OP), Place 2 drops into both eyes daily as needed (dry eyes). , Disp: , Rfl:    Omega-3 1400 MG CAPS, Take 1,400 mg by mouth daily., Disp: , Rfl:    Probiotic CAPS, Take 2 capsules by mouth daily., Disp: , Rfl:    rosuvastatin (CRESTOR) 5 MG tablet, Take 5 mg by mouth daily., Disp: , Rfl:    TURMERIC PO, Take 2,000 mg by mouth daily., Disp: , Rfl: :    No Known Allergies:  FH: No family history of malignancy.  SOCIAL HISTORY: He lives in [place] with his wife.  He has 4 children, 2 biological, 2 adopted.  He has retired several times.  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  He stopped working due to presenting symptoms.  Review of Systems: No fevers or sweats.  No flushing episodes.  No bleeding.  He has upper abdominal pain that typically occurs after eating.  Main pain is  in the lower back, partially relieved with ibuprofen.  He has also been experiencing pain at the left posterior neck.  Appetite is poor.  He is losing weight.  He reports a normal weight for him is 175 to 178 pounds.  No dysphagia.  No unusual headaches.  No vision change.  No shortness of breath or cough.  He has constipation.  He is taking MiraLAX.  He had black stools when taking Pepto-Bismol.  He has occasional nausea.  No urinary symptoms.  No numbness or tingling in the extremities.  No rash.  Physical Exam:  Blood pressure 103/66, pulse 96, temperature 97.9 F (36.6 C),  temperature source Temporal, resp. rate 18, height 5\' 9"  (1.753 m), weight 164 lb 3.2 oz (74.5 kg), SpO2 98%.  HEENT: No thrush or ulcers. Lungs: Lungs clear bilaterally. Cardiac: Regular rate and rhythm. Abdomen: No hepatosplenomegaly.  No mass. Vascular: No leg edema. Lymph nodes: No palpable cervical, supraclavicular, axillary or inguinal lymph nodes. Neurologic: Alert and oriented. Skin: No rash.  LABS:  No results for input(s): "WBC", "HGB", "HCT", "PLT" in the last 72 hours.  No results for input(s): "NA", "K", "CL", "CO2", "GLUCOSE", "BUN", "CREATININE", "CALCIUM" in the last 72 hours.    RADIOLOGY:  CT ABDOMEN PELVIS W CONTRAST Result Date: 01/29/2023 CLINICAL DATA:  Generalized abdominal pain. Unintentional weight loss. * Tracking Code: BO * EXAM: CT ABDOMEN AND PELVIS WITH CONTRAST TECHNIQUE: Multidetector CT imaging of the abdomen and pelvis was performed using the standard protocol following bolus administration of intravenous contrast. RADIATION DOSE REDUCTION: This exam was performed according to the departmental dose-optimization program which includes automated exposure control, adjustment of the mA and/or kV according to patient size and/or use of iterative reconstruction technique. CONTRAST:  75mL OMNIPAQUE IOHEXOL 350 MG/ML SOLN COMPARISON:  None Available. FINDINGS: Lower Chest: No acute findings. Hepatobiliary: Multiple hepatic cysts are noted. No suspicious hepatic masses identified. Gallbladder is unremarkable. No evidence of biliary ductal dilatation. Pancreas: Necrotic mass with peripheral enhancement is seen involving the pancreatic body and tail which measures 10.1 x 6.3 cm, highly suspicious for pancreatic carcinoma. This mass encases the splenic artery and causes splenic vein thrombosis. No evidence of vascular involvement involving the celiac axis, superior mesenteric artery or veins, or portal vein. Spleen: Within normal limits in size and appearance.  Adrenals/Urinary Tract: No suspicious masses identified. No evidence of ureteral calculi or hydronephrosis. Stomach/Bowel: No evidence of obstruction, inflammatory process or abnormal fluid collections. Diverticulosis is seen mainly involving the descending and sigmoid colon, however there is no evidence of diverticulitis. Vascular/Lymphatic: Necrotic peripancreatic lymph node is seen along the anterior aspect of the pancreatic body measuring 2.4 x 1.9 cm on image 22/3. Multiple left upper quadrant necrotic masses are seen in the gastrosplenic ligament and adjacent to the splenic flexure of the colon, ranging in size from 2.3 cm 24.1 cm in maximum diameter. These findings are consistent with metastatic disease. Reproductive:  Mildly enlarged prostate. Other:  None. Musculoskeletal: No suspicious bone lesions identified. Right hip prosthesis. IMPRESSION: 10 cm necrotic mass involving the pancreatic body and tail, highly suspicious for pancreatic carcinoma. This mass encases the splenic artery and causes splenic vein thrombosis. Necrotic peripancreatic lymph node, and several necrotic masses in left upper quadrant, consistent with metastatic disease. No evidence of pelvic metastatic disease. Colonic diverticulosis, without radiographic evidence of diverticulitis. Mildly enlarged prostate. Electronically Signed   By: Danae Orleans M.D.   On: 01/29/2023 16:23    Assessment and Plan:   Bloating/constipation/anorexia  and weight loss/back pain EGD 01/20/2023-diffuse moderate mucosal changes characterized by congestion and erythema in the gastric antrum; a large ulcerated and fungating mass with stigmata of recent bleeding in the duodenal bulb; segmental moderate inflammation characterized by congestion and erythema found at the pylorus.  There was no evidence of dysplasia or malignancy in the duodenum biopsies.   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 Hypertension  Ralph Davis appears to have a pancreas mass and multiple left upper quadrant masses.  He and his wife understand he most likely has metastatic pancreas cancer.  He is scheduled for a biopsy in Interventional Radiology later this week.  We have referred him for staging CTs of the neck and chest.  We will obtain labs including tumor markers, CBC, chemistry panel.  He is having back pain likely related to the pancreas mass.  Prescription sent to his pharmacy for Norco 5/325 1 to 2 tablets every 6 hours as needed.  He will begin a bowel regimen.  He will return for follow-up 02/11/2023.  We are available to see him sooner if needed.  Patient seen with Dr. Truett Perna.      [name], NP 02/05/2023, 2:24 PM  This was a shared visit with [name].  Mr. Sigel was interviewed and examined.  We reviewed the EGD findings and CT images with Ralph Davis and his wife.  He appears to have a malignancy involving the pancreas and abdominal masses.  The most likely diagnosis is metastatic pancreas cancer, though the differential diagnosis includes a primary small bowel tumor and other malignancies.  The EGD biopsy of the duodenal "mass "was nondiagnostic.  He is scheduled for biopsy of an abdominal mass on 02/07/2023.  He will return for an office visit on 02/11/2023.  He is symptomatic with pain.  He will begin a trial of hydrocodone.  We will make treatment recommendations when a pathologic diagnosis is obtained.  I was present for greater  than 50% of today's visit.  I performed Medical Decision Making.  Mancel Bale, MD

## 2023-02-05 NOTE — Progress Notes (Unsigned)
PATIENT NAVIGATOR PROGRESS NOTE  Name: Joshuia Bergstein Date: 02/05/2023 MRN: [id number]  DOB: [birth year]   Reason for visit:  New Patient appt  Comments:  Met with Mr and Mrs Kiesewetter during visit with Dr Truett Perna and [name] NP  Pt will return for labs on 12/19 He is scheduled CT biopsy on Friday 12/20 at [facility], will request stat pathology read  CT head and chest ordered and will work on scheduling Will return to clinic for visit on 12/24 Given information on how to take pain medicine and a bowel regimen Given contact number to call with any questions    Time spent counseling/coordinating care: > 60 minutes

## 2023-02-06 ENCOUNTER — Inpatient Hospital Stay: Payer: Medicare Other

## 2023-02-06 ENCOUNTER — Other Ambulatory Visit: Payer: Self-pay | Admitting: Radiology

## 2023-02-06 DIAGNOSIS — I10 Essential (primary) hypertension: Secondary | ICD-10-CM | POA: Diagnosis not present

## 2023-02-06 DIAGNOSIS — I82890 Acute embolism and thrombosis of other specified veins: Secondary | ICD-10-CM | POA: Diagnosis not present

## 2023-02-06 DIAGNOSIS — R1900 Intra-abdominal and pelvic swelling, mass and lump, unspecified site: Secondary | ICD-10-CM

## 2023-02-06 DIAGNOSIS — C258 Malignant neoplasm of overlapping sites of pancreas: Secondary | ICD-10-CM | POA: Diagnosis not present

## 2023-02-06 DIAGNOSIS — C7989 Secondary malignant neoplasm of other specified sites: Secondary | ICD-10-CM | POA: Diagnosis not present

## 2023-02-06 DIAGNOSIS — Z5111 Encounter for antineoplastic chemotherapy: Secondary | ICD-10-CM | POA: Diagnosis not present

## 2023-02-06 LAB — CBC WITH DIFFERENTIAL (CANCER CENTER ONLY)
Abs Immature Granulocytes: 0.03 10*3/uL (ref 0.00–0.07)
Basophils Absolute: 0.1 10*3/uL (ref 0.0–0.1)
Basophils Relative: 1 %
Eosinophils Absolute: 0.1 10*3/uL (ref 0.0–0.5)
Eosinophils Relative: 1 %
HCT: 39.8 % (ref 39.0–52.0)
Hemoglobin: 12.5 g/dL — ABNORMAL LOW (ref 13.0–17.0)
Immature Granulocytes: 0 %
Lymphocytes Relative: 14 %
Lymphs Abs: 1.5 10*3/uL (ref 0.7–4.0)
MCH: 27.7 pg (ref 26.0–34.0)
MCHC: 31.4 g/dL (ref 30.0–36.0)
MCV: 88.2 fL (ref 80.0–100.0)
Monocytes Absolute: 0.7 10*3/uL (ref 0.1–1.0)
Monocytes Relative: 7 %
Neutro Abs: 8 10*3/uL — ABNORMAL HIGH (ref 1.7–7.7)
Neutrophils Relative %: 77 %
Platelet Count: 180 10*3/uL (ref 150–400)
RBC: 4.51 MIL/uL (ref 4.22–5.81)
RDW: 14 % (ref 11.5–15.5)
WBC Count: 10.4 10*3/uL (ref 4.0–10.5)
nRBC: 0 % (ref 0.0–0.2)

## 2023-02-06 LAB — CMP (CANCER CENTER ONLY)
ALT: 14 U/L (ref 0–44)
AST: 14 U/L — ABNORMAL LOW (ref 15–41)
Albumin: 4 g/dL (ref 3.5–5.0)
Alkaline Phosphatase: 75 U/L (ref 38–126)
Anion gap: 5 (ref 5–15)
BUN: 23 mg/dL (ref 8–23)
CO2: 28 mmol/L (ref 22–32)
Calcium: 9.9 mg/dL (ref 8.9–10.3)
Chloride: 105 mmol/L (ref 98–111)
Creatinine: 0.69 mg/dL (ref 0.61–1.24)
GFR, Estimated: >60 mL/min (ref >60)
Glucose, Bld: 140 mg/dL — ABNORMAL HIGH (ref 70–99)
Potassium: 4.3 mmol/L (ref 3.5–5.1)
Sodium: 138 mmol/L (ref 135–145)
Total Bilirubin: 0.5 mg/dL (ref <1.2)
Total Protein: 7.5 g/dL (ref 6.5–8.1)

## 2023-02-06 LAB — CEA (ACCESS): CEA (CHCC): 75.6 ng/mL — ABNORMAL HIGH (ref 0.00–5.00)

## 2023-02-06 NOTE — Progress Notes (Signed)
Patient for CT guided Abd mass biopsy on Friday 02/07/2023, I called and LVM for the patient on the phone and gave pre-procedure instructions. VM made pt aware to be here at 12p, NPO after MN prior to procedure as well as driver post procedure/recovery/discharge. Called 02/05/2023 and 02/06/2023

## 2023-02-07 ENCOUNTER — Ambulatory Visit
Admission: RE | Admit: 2023-02-07 | Discharge: 2023-02-07 | Disposition: A | Discharge status: Home / Self Care | Payer: Medicare Other | Source: Ambulatory Visit | Attending: Oncology | Admitting: Oncology

## 2023-02-07 ENCOUNTER — Other Ambulatory Visit: Payer: Self-pay

## 2023-02-07 VITALS — BP 127/76 | HR 80 | Temp 97.8°F | Resp 12 | Ht 68.0 in | Wt 166.0 lb

## 2023-02-07 DIAGNOSIS — C7989 Secondary malignant neoplasm of other specified sites: Secondary | ICD-10-CM | POA: Diagnosis not present

## 2023-02-07 DIAGNOSIS — M1990 Unspecified osteoarthritis, unspecified site: Secondary | ICD-10-CM | POA: Insufficient documentation

## 2023-02-07 DIAGNOSIS — R1907 Generalized intra-abdominal and pelvic swelling, mass and lump: Secondary | ICD-10-CM

## 2023-02-07 DIAGNOSIS — I82890 Acute embolism and thrombosis of other specified veins: Secondary | ICD-10-CM | POA: Diagnosis not present

## 2023-02-07 DIAGNOSIS — C259 Malignant neoplasm of pancreas, unspecified: Secondary | ICD-10-CM | POA: Insufficient documentation

## 2023-02-07 DIAGNOSIS — K669 Disorder of peritoneum, unspecified: Secondary | ICD-10-CM | POA: Diagnosis not present

## 2023-02-07 DIAGNOSIS — R634 Abnormal weight loss: Secondary | ICD-10-CM | POA: Diagnosis not present

## 2023-02-07 DIAGNOSIS — G4733 Obstructive sleep apnea (adult) (pediatric): Secondary | ICD-10-CM | POA: Insufficient documentation

## 2023-02-07 DIAGNOSIS — R109 Unspecified abdominal pain: Secondary | ICD-10-CM | POA: Diagnosis not present

## 2023-02-07 DIAGNOSIS — K5900 Constipation, unspecified: Secondary | ICD-10-CM | POA: Insufficient documentation

## 2023-02-07 DIAGNOSIS — K869 Disease of pancreas, unspecified: Secondary | ICD-10-CM | POA: Insufficient documentation

## 2023-02-07 DIAGNOSIS — N400 Enlarged prostate without lower urinary tract symptoms: Secondary | ICD-10-CM | POA: Diagnosis not present

## 2023-02-07 DIAGNOSIS — I10 Essential (primary) hypertension: Secondary | ICD-10-CM | POA: Diagnosis not present

## 2023-02-07 DIAGNOSIS — Z01818 Encounter for other preprocedural examination: Secondary | ICD-10-CM

## 2023-02-07 LAB — CBC
HCT: 39.7 % (ref 39.0–52.0)
Hemoglobin: 12.4 g/dL — ABNORMAL LOW (ref 13.0–17.0)
MCH: 27.8 pg (ref 26.0–34.0)
MCHC: 31.2 g/dL (ref 30.0–36.0)
MCV: 89 fL (ref 80.0–100.0)
Platelets: 189 10*3/uL (ref 150–400)
RBC: 4.46 MIL/uL (ref 4.22–5.81)
RDW: 13.9 % (ref 11.5–15.5)
WBC: 11.5 10*3/uL — ABNORMAL HIGH (ref 4.0–10.5)
nRBC: 0 % (ref 0.0–0.2)

## 2023-02-07 LAB — PROTIME-INR
INR: 1.1 (ref 0.8–1.2)
Prothrombin Time: 14.7 s (ref 11.4–15.2)

## 2023-02-07 LAB — CANCER ANTIGEN 19-9: CA 19-9: 960 U/mL — ABNORMAL HIGH (ref 0–35)

## 2023-02-07 MED ORDER — MIDAZOLAM HCL 2 MG/2ML IJ SOLN
INTRAMUSCULAR | Status: AC
  Filled 2023-02-07: qty 4

## 2023-02-07 MED ORDER — FENTANYL CITRATE (PF) 100 MCG/2ML IJ SOLN
INTRAMUSCULAR | Status: AC | PRN
  Administered 2023-02-07: 50 ug via INTRAVENOUS

## 2023-02-07 MED ORDER — LIDOCAINE HCL 2 % IJ SOLN
INTRAMUSCULAR | Status: AC | PRN
  Administered 2023-02-07: 5 mL via INTRADERMAL

## 2023-02-07 MED ORDER — MIDAZOLAM HCL 2 MG/2ML IJ SOLN
INTRAMUSCULAR | Status: AC | PRN
  Administered 2023-02-07: 1 mg via INTRAVENOUS

## 2023-02-07 MED ORDER — FENTANYL CITRATE (PF) 100 MCG/2ML IJ SOLN
INTRAMUSCULAR | Status: AC
  Filled 2023-02-07: qty 2

## 2023-02-07 NOTE — Procedures (Signed)
Interventional Radiology Procedure Note  Procedure: CT guided left upper quadrant mass biopsy  Indication: Pancreatic mass with multiple metastatic implant  Findings: Please refer to procedural dictation for full description.  Complications: None  EBL: < 10 mL  Acquanetta Belling, MD [phone]

## 2023-02-10 ENCOUNTER — Ambulatory Visit: Payer: Medicare Other

## 2023-02-10 ENCOUNTER — Ambulatory Visit (HOSPITAL_BASED_OUTPATIENT_CLINIC_OR_DEPARTMENT_OTHER)
Admission: RE | Admit: 2023-02-10 | Discharge: 2023-02-10 | Disposition: A | Discharge status: Home / Self Care | Payer: Medicare Other | Source: Ambulatory Visit | Attending: Nurse Practitioner | Admitting: Nurse Practitioner

## 2023-02-10 ENCOUNTER — Encounter: Payer: Self-pay | Admitting: *Deleted

## 2023-02-10 DIAGNOSIS — R221 Localized swelling, mass and lump, neck: Secondary | ICD-10-CM | POA: Diagnosis not present

## 2023-02-10 DIAGNOSIS — R1900 Intra-abdominal and pelvic swelling, mass and lump, unspecified site: Secondary | ICD-10-CM | POA: Diagnosis not present

## 2023-02-10 DIAGNOSIS — J9811 Atelectasis: Secondary | ICD-10-CM | POA: Diagnosis not present

## 2023-02-10 DIAGNOSIS — M47812 Spondylosis without myelopathy or radiculopathy, cervical region: Secondary | ICD-10-CM | POA: Diagnosis not present

## 2023-02-10 DIAGNOSIS — K8689 Other specified diseases of pancreas: Secondary | ICD-10-CM

## 2023-02-10 DIAGNOSIS — M542 Cervicalgia: Secondary | ICD-10-CM

## 2023-02-10 LAB — SURGICAL PATHOLOGY

## 2023-02-10 MED ORDER — IOHEXOL 300 MG/ML  SOLN
100.0000 mL | Freq: Once | INTRAMUSCULAR | Status: AC | PRN
  Administered 2023-02-10: 100 mL via INTRAVENOUS

## 2023-02-10 NOTE — Progress Notes (Signed)
PATIENT NAVIGATOR PROGRESS NOTE  Name: Ralph Davis Date: 02/10/2023 MRN: [id number]  DOB: 1946/02/12   Reason for visit:  Molecular testing  Comments:  [employer] testing requested on [id number]    Time spent counseling/coordinating care: 30-45 minutes

## 2023-02-11 ENCOUNTER — Encounter: Payer: Self-pay | Admitting: *Deleted

## 2023-02-11 ENCOUNTER — Inpatient Hospital Stay: Payer: Medicare Other | Admitting: Nurse Practitioner

## 2023-02-11 ENCOUNTER — Encounter: Payer: Self-pay | Admitting: Nurse Practitioner

## 2023-02-11 VITALS — BP 119/72 | HR 84 | Temp 98.1°F | Resp 15 | Ht 68.0 in | Wt 166.6 lb

## 2023-02-11 DIAGNOSIS — C252 Malignant neoplasm of tail of pancreas: Secondary | ICD-10-CM | POA: Insufficient documentation

## 2023-02-11 DIAGNOSIS — C7989 Secondary malignant neoplasm of other specified sites: Secondary | ICD-10-CM | POA: Diagnosis not present

## 2023-02-11 DIAGNOSIS — R1900 Intra-abdominal and pelvic swelling, mass and lump, unspecified site: Secondary | ICD-10-CM | POA: Diagnosis not present

## 2023-02-11 DIAGNOSIS — M542 Cervicalgia: Secondary | ICD-10-CM | POA: Diagnosis not present

## 2023-02-11 DIAGNOSIS — Z5111 Encounter for antineoplastic chemotherapy: Secondary | ICD-10-CM | POA: Diagnosis not present

## 2023-02-11 DIAGNOSIS — C258 Malignant neoplasm of overlapping sites of pancreas: Secondary | ICD-10-CM | POA: Diagnosis not present

## 2023-02-11 DIAGNOSIS — K8689 Other specified diseases of pancreas: Secondary | ICD-10-CM | POA: Diagnosis not present

## 2023-02-11 DIAGNOSIS — I82890 Acute embolism and thrombosis of other specified veins: Secondary | ICD-10-CM | POA: Diagnosis not present

## 2023-02-11 DIAGNOSIS — I10 Essential (primary) hypertension: Secondary | ICD-10-CM | POA: Diagnosis not present

## 2023-02-11 MED ORDER — HYDROCODONE-ACETAMINOPHEN 5-325 MG PO TABS: 1.0000 | ORAL_TABLET | Freq: Four times a day (QID) | ORAL | 0 refills | Status: DC | PRN

## 2023-02-11 NOTE — Progress Notes (Signed)
Faxed notification from [facility]: orders received and are in process of obtaining tissue sample.

## 2023-02-11 NOTE — Progress Notes (Signed)
[facility] OFFICE PROGRESS NOTE   Diagnosis: Pancreas cancer  INTERVAL HISTORY:   Mr. Choat returns as scheduled.  He underwent biopsy of a left abdominal soft tissue implant 02/07/2023.  Pathology shows metastatic moderately differentiated adenocarcinoma.  He continues to have back pain.  He estimates taking pain medication 3 times a day.  Pain control.  Recent constipation.  He takes a stool softener.  No nausea.  Appetite remains poor.  No baseline numbness/tingling in the hands or feet.  Objective:  Vital signs in last 24 hours:  Blood pressure 119/72, pulse 84, temperature 98.1 F (36.7 C), temperature source Temporal, resp. rate 15, height 5\' 8"  (1.727 m), weight 166 lb 9.6 oz (75.6 kg), SpO2 100%.    HEENT: Mild white coating over tongue.  No buccal thrush. Resp: Lungs clear bilaterally. Cardio: Regular rate and rhythm. GI: No hepatosplenomegaly Vascular: No leg edema.  Lab Results:  Lab Results  Component Value Date   WBC 11.5 (H) 02/07/2023   HGB 12.4 (L) 02/07/2023   HCT 39.7 02/07/2023   MCV 89.0 02/07/2023   PLT 189 02/07/2023   NEUTROABS 8.0 (H) 02/06/2023    Imaging:  CT Soft Tissue Neck W Contrast Result Date: 02/10/2023 CLINICAL DATA:  pancreatic mass, abdominal masses; left posterior neck pain EXAM: CT NECK WITH CONTRAST TECHNIQUE: Multidetector CT imaging of the neck was performed using the standard protocol following the bolus administration of intravenous contrast. RADIATION DOSE REDUCTION: This exam was performed according to the departmental dose-optimization program which includes automated exposure control, adjustment of the mA and/or kV according to patient size and/or use of iterative reconstruction technique. CONTRAST:  OMNIPAQUE IOHEXOL 300 MG/ML  SOLN COMPARISON:  None Available. FINDINGS: Pharynx and larynx: Normal. No mass or swelling. Salivary glands: No inflammation, mass, or stone. Thyroid: Normal. Lymph nodes: None  enlarged or abnormal density. Vascular: Not well assessed given non arterial timing. Limited intracranial: Negative. Visualized orbits: Negative. Mastoids and visualized paranasal sinuses: Mostly clear. Skeleton: Degenerative changes in the spine including degenerative disc disease and facet/uncovertebral hypertrophy. Thickening of the nuchal ligament, likely degenerative. Upper chest: Visualized lung apices are clear. IMPRESSION: 1. No evidence of acute abnormality. 2. Degenerative changes in the cervical spine. Electronically Signed   By: Feliberto Harts M.D.   On: 02/10/2023 10:26   CT Chest W Contrast Result Date: 02/10/2023 CLINICAL DATA:  Large pancreatic mass. Assess or metastatic disease. * Tracking Code: BO * EXAM: CT CHEST WITH CONTRAST TECHNIQUE: Multidetector CT imaging of the chest was performed during intravenous contrast administration. RADIATION DOSE REDUCTION: This exam was performed according to the departmental dose-optimization program which includes automated exposure control, adjustment of the mA and/or kV according to patient size and/or use of iterative reconstruction technique. CONTRAST:  OMNIPAQUE IOHEXOL 300 MG/ML  SOLN COMPARISON:  CT abdomen/pelvis 01/28/2023 FINDINGS: Cardiovascular: The heart is normal in size. No pericardial effusion. Mild tortuosity of the thoracic aorta but no aneurysm or dissection. Scattered atherosclerotic calcifications. Three-vessel coronary artery calcifications are noted. Mediastinum/Nodes: No mediastinal or hilar mass or lymphadenopathy. Small scattered lymph nodes are noted. The esophagus is unremarkable. Small amount of debris is noted in the trachea chest above the carina. This is likely mucous. Lungs/Pleura: Moderate eventration of both hemidiaphragms with some overlying vascular crowding and atelectasis, left greater than right. No worrisome pulmonary lesions or pulmonary nodules to suggest metastatic disease. No pleural effusions or pleural  nodules. Upper Abdomen: Large cystic appearing pancreatic mass and adjacent cystic/necrotic peritoneal lesions.  Scattered hepatic cysts. No adrenal lesions. Musculoskeletal: Mild asymmetric right-sided gynecomastia. Recommend correlation with physical exam. The bony thorax is intact. No worrisome bone lesions. IMPRESSION: 1. No findings for metastatic disease involving the chest. 2. Moderate eventration of both hemidiaphragms with some overlying vascular crowding and atelectasis, left greater than right. 3. Large cystic appearing pancreatic mass and adjacent cystic/necrotic peritoneal lesions. 4. Mild asymmetric right-sided gynecomastia. Recommend correlation with physical exam. 5. Aortic atherosclerosis. Aortic Atherosclerosis (ICD10-I70.0). Electronically Signed   By: Rudie Meyer M.D.   On: 02/10/2023 10:11    Medications: I have reviewed the patient's current medications.  Assessment/Plan: Pancreas cancer  bloating/constipation/anorexia and weight loss/back pain EGD 01/20/2023-diffuse moderate mucosal changes characterized by congestion and erythema in the gastric antrum; a large ulcerated and fungating mass with stigmata of recent bleeding in the duodenal bulb; segmental moderate inflammation characterized by congestion and erythema found at the pylorus.  There was no evidence of dysplasia or malignancy in the duodenum biopsies.   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 02/06/2023 CEA and CA 19-9  elevated 02/07/2023 biopsy left abdominal soft tissue implant-metastatic moderately differentiated adenocarcinoma 02/10/2023 CTs neck and chest-no metastatic disease.  Large cystic appearing pancreatic mass and adjacent cystic/necrotic peritoneal lesions. Hypertension    Disposition: Mr. Amirian appears unchanged.  Biopsy of the left abdomen soft tissue implant showed metastatic adenocarcinoma.  We reviewed the result with him and his wife at today's visit.  They understand he most likely has metastatic pancreas cancer.  We reviewed the diagnosis, prognosis and treatment options.  They understand no therapy will be curative.  We discussed various treatment regimens.  He appears to be a candidate for NALIRIFOX.  We reviewed potential side effects associated with chemotherapy including bone marrow toxicity, nausea, hair loss, allergic reaction.  We discussed potential side effects associated with 5-fluorouracil including mouth sores, diarrhea, hand-foot syndrome, increased sensitivity to sun, rash, skin hyperpigmentation, cardiotoxicity.  We reviewed the various forms of neuropathy seen with oxaliplatin.  We discussed the possibility of early and late phase diarrhea associated with irinotecan.  He will attend a chemotherapy education class.  He understands a Port-A-Cath is required for this regimen.  He is scheduled for Port-A-Cath placement later this week.  He will return for cycle 1 NALIRIFOX the week of 02/17/2023.  We will see him in follow-up prior to cycle 2.  Patient seen with Dr. Truett Perna.  [name] ANP/GNP-BC   02/11/2023  11:50 AM   This was a shared visit with [name].  We discussed the abdominal mass biopsy results with Mr. Foye Deer.  The clinical presentation and pathology are consistent with metastatic pancreas cancer.  We discussed the prognosis and treatment options.  I recommend systemic therapy.  The biopsy tissue will be submitted for NGS testing.  He understands the  goals of treatment are to improve his symptoms and extend survival.  Treatment will not be curative.  We reviewed potential toxicities associated with the NALIRIFOX regimen.  He agrees to proceed.  He will attend a chemotherapy teaching class.  He  is scheduled for Port-A-Cath placement later this week.  He will be scheduled for cycle 1 NALIRIFOX next week.  A treatment plan was entered today.  He will continue hydrocodone as needed for pain.  I was present for greater than 50% of today's visit.  I performed medical decision making.  Mancel Bale, MD

## 2023-02-11 NOTE — Progress Notes (Signed)
START OFF PATHWAY REGIMEN - Pancreatic Adenocarcinoma   [id number]:NALIRIFOX (Liposomal Irinotecan IV D1,15 + Fluorouracil CIV D1,2,15,16 + Leucovorin IV D1,15 + Oxaliplatin IV D1,15) q28 Days:   A cycle is every 28 days:     Liposomal irinotecan      Oxaliplatin      Leucovorin      Fluorouracil   **Always confirm dose/schedule in your pharmacy ordering system**  Patient Characteristics: Metastatic Disease, First Line, PS = 0,1, BRCA1/2 and PALB2  Mutation Absent/Unknown Therapeutic Status: Metastatic Disease Line of Therapy: First Line ECOG Performance Status: 1 BRCA1/2 Mutation Status: Awaiting Test Results PALB2 Mutation Status: Awaiting Test Results Intent of Therapy: Non-Curative / Palliative Intent, Discussed with Patient

## 2023-02-12 ENCOUNTER — Other Ambulatory Visit: Payer: Self-pay

## 2023-02-13 ENCOUNTER — Encounter (HOSPITAL_COMMUNITY): Payer: Self-pay

## 2023-02-13 ENCOUNTER — Other Ambulatory Visit: Payer: Self-pay | Admitting: Physician Assistant

## 2023-02-13 ENCOUNTER — Other Ambulatory Visit: Payer: Self-pay

## 2023-02-13 ENCOUNTER — Ambulatory Visit (HOSPITAL_COMMUNITY)
Admission: RE | Admit: 2023-02-13 | Discharge: 2023-02-13 | Disposition: A | Discharge status: Home / Self Care | Payer: Medicare Other | Source: Ambulatory Visit | Attending: Oncology | Admitting: Oncology

## 2023-02-13 DIAGNOSIS — Z01818 Encounter for other preprocedural examination: Secondary | ICD-10-CM

## 2023-02-13 DIAGNOSIS — Z87891 Personal history of nicotine dependence: Secondary | ICD-10-CM | POA: Diagnosis not present

## 2023-02-13 DIAGNOSIS — R1900 Intra-abdominal and pelvic swelling, mass and lump, unspecified site: Secondary | ICD-10-CM | POA: Diagnosis not present

## 2023-02-13 DIAGNOSIS — K8689 Other specified diseases of pancreas: Secondary | ICD-10-CM | POA: Diagnosis not present

## 2023-02-13 DIAGNOSIS — C259 Malignant neoplasm of pancreas, unspecified: Secondary | ICD-10-CM | POA: Diagnosis not present

## 2023-02-13 DIAGNOSIS — C251 Malignant neoplasm of body of pancreas: Secondary | ICD-10-CM | POA: Insufficient documentation

## 2023-02-13 HISTORY — PX: IR IMAGING GUIDED PORT INSERTION: IMG5740

## 2023-02-13 MED ORDER — MIDAZOLAM HCL 2 MG/2ML IJ SOLN
INTRAMUSCULAR | Status: AC | PRN
  Administered 2023-02-13: 1 mg via INTRAVENOUS

## 2023-02-13 MED ORDER — MIDAZOLAM HCL 2 MG/2ML IJ SOLN
INTRAMUSCULAR | Status: AC
  Filled 2023-02-13: qty 2

## 2023-02-13 MED ORDER — LIDOCAINE HCL (PF) 1 % IJ SOLN
30.0000 mL | Freq: Once | INTRAMUSCULAR | Status: AC
  Administered 2023-02-13: 15 mL via INTRADERMAL

## 2023-02-13 MED ORDER — HEPARIN SOD (PORK) LOCK FLUSH 100 UNIT/ML IV SOLN
INTRAVENOUS | Status: AC
  Filled 2023-02-13: qty 5

## 2023-02-13 MED ORDER — FENTANYL CITRATE (PF) 100 MCG/2ML IJ SOLN
INTRAMUSCULAR | Status: AC | PRN
  Administered 2023-02-13 (×2): 25 ug via INTRAVENOUS

## 2023-02-13 MED ORDER — LIDOCAINE HCL 1 % IJ SOLN
INTRAMUSCULAR | Status: AC
  Filled 2023-02-13: qty 20

## 2023-02-13 MED ORDER — FENTANYL CITRATE (PF) 100 MCG/2ML IJ SOLN
INTRAMUSCULAR | Status: AC
  Filled 2023-02-13: qty 2

## 2023-02-13 NOTE — H&P (Signed)
Chief Complaint: Patient was seen in consultation today for Port-A-Cath placement for chemotherapy administration.  Referring Provider(s): Dr. Ladene Artist, MD  Supervising Physician: Irish Lack  Patient Status: [facility] - Out-pt  Patient is Full Code  History of Present Illness: Patient is know to IR service, having most recently undergone a left abdominal mass biopsy on 12/20 by Dr. Bryn Gulling.  Ralph Davis is a 77 y.o. male with PMHx of HTN, OSA, and arthritis.   Per review of external chart, patient was referred by PCP to Dr. Truett Perna for evaluation of unintentional weight loss (20lbs since 8/24), poor appetite, lethargy, constipation, and abdominal bloating and pain for greater than 2 months. Workup and labs at PCP unremarkable. Last colonoscopy in 2022 notable for removal of tubular adenoma. Upper endoscopy was performed [facility]) with gastric mucosa returning benign. CT A/P w/ contrast on 12/10 notable for 10 cm necrotic mass involving the pancreatic body and tail, highly suspicious for pancreatic carcinoma and several necrotic masses in left upper quadrant, consistent with metastatic disease. Patient underwent LUQ mass biopsy on 12/20, demonstrating metastatic moderately differentiated adenocarcinoma. Plan to begin chemotherapy next week.   Interventional Radiology was requested for Port-A-Cath placement. Patient is scheduled for same in IR today.   All labs and medications are within acceptable parameters. No pertinent allergies. Patient has been NPO since midnight.    Currently without any significant complaints. Patient alert and laying in bed,calm. Denies any fevers, headache, chest pain, SOB, cough, abdominal pain, nausea, vomiting or bleeding.    Past Medical History:  Diagnosis Date   Arthritis    Hypertension    Pneumonia    Sleep apnea     Past Surgical History:  Procedure Laterality Date   KNEE ARTHROPLASTY Left    TOTAL HIP ARTHROPLASTY Right 05/07/2019    Procedure: TOTAL HIP ARTHROPLASTY;  Surgeon: Frederico Hamman, MD;  Location: [facility] ORS;  Service: Orthopedics;  Laterality: Right;   VASECTOMY  1970   VASECTOMY REVERSAL      Allergies: Patient has no known allergies.  Medications: Prior to Admission medications   Medication Sig Start Date End Date Taking? Authorizing Provider  celecoxib (CELEBREX) 200 MG capsule Take 1 capsule (200 mg total) by mouth 2 (two) times daily. 05/07/19   Chadwell, Ivin Booty, PA-C  Coenzyme Q10 300 MG CAPS Take 300 mg by mouth daily.    [provider]  Glucosamine-Chondroit-Vit C-Mn (GLUCOSAMINE CHONDR 1500 COMPLX) CAPS Take 1 capsule by mouth daily.    [provider]  hydrochlorothiazide (HYDRODIURIL) 25 MG tablet Take 25 mg by mouth daily. Patient not taking: Reported on 02/11/2023 02/25/19   [provider]  HYDROcodone-acetaminophen (NORCO) 5-325 MG tablet Take 1-2 tablets by mouth every 6 (six) hours as needed for moderate pain (pain score 4-6). Do not drive while taking 52/84/13   Rana Snare, NP  ibuprofen (ADVIL) 600 MG tablet Take 600 mg by mouth every 6 (six) hours as needed.    [provider]  lisinopril (ZESTRIL) 40 MG tablet Take 40 mg by mouth daily. 02/25/19   [provider]  Multiple Vitamin (MULTIVITAMIN WITH MINERALS) TABS tablet Take 1 tablet by mouth daily.    [provider]  Naphazoline HCl (CLEAR EYES OP) Place 2 drops into both eyes daily as needed (dry eyes).     [provider]  Omega-3 1400 MG CAPS Take 1,400 mg by mouth daily.    [provider]  Probiotic CAPS Take 2 capsules by  mouth daily.    [provider]  rosuvastatin (CRESTOR) 5 MG tablet Take 5 mg by mouth daily.    [provider]  TURMERIC PO Take 2,000 mg by mouth daily.    [provider]     History reviewed. No pertinent family history.  Social History   Socioeconomic History   Marital status: Married    Spouse name:  Not on file   Number of children: Not on file   Years of education: Not on file   Highest education level: Not on file  Occupational History   Not on file  Tobacco Use   Smoking status: Former    Current packs/day: 0.00    Types: Cigarettes    Quit date: 17    Years since quitting: 48.0   Smokeless tobacco: Never  Vaping Use   Vaping status: Never Used  Substance and Sexual Activity   Alcohol use: Not Currently    Comment: occas.   Drug use: Never   Sexual activity: Not on file  Other Topics Concern   Not on file  Social History Narrative   Retired from Education officer, environmental at corporation   Social Drivers of [employer]: Low Risk  (02/05/2023)   Overall Financial Resource Strain (CARDIA)    Difficulty of Paying Living Expenses: Not hard at all  Food Insecurity: No Food Insecurity (02/05/2023)   Hunger Vital Sign    Worried About Running Out of Food in the Last Year: Never true    Ran Out of Food in the Last Year: Never true  Transportation Needs: No Transportation Needs (02/05/2023)   PRAPARE - Administrator, Civil Service (Medical): No    Lack of Transportation (Non-Medical): No  Physical Activity: Inactive (02/05/2023)   Exercise Vital Sign    Days of Exercise per Week: 0 days    Minutes of Exercise per Session: 0 min  Stress: No Stress Concern Present (02/05/2023)   [employer] of Occupational Health - Occupational Stress Questionnaire    Feeling of Stress : Not at all  Social Connections: Socially Integrated (02/05/2023)   Social Connection and Isolation Panel [NHANES]    Frequency of Communication with Friends and Family: Three times a week    Frequency of Social Gatherings with Friends and Family: Three times a week    Attends Religious Services: More than 4 times per year    Active Member of Clubs or Organizations: Yes    Attends Banker Meetings: More than 4 times per year    Marital Status: Married     Review  of Systems: A 12 point ROS discussed and pertinent positives are indicated in the HPI above.  All other systems are negative.  Review of Systems  Constitutional:  Positive for appetite change and unexpected weight change. Negative for chills and fever.  Respiratory:  Negative for cough, chest tightness, shortness of breath and wheezing.   Cardiovascular:  Negative for chest pain.  Gastrointestinal:  Positive for abdominal pain and constipation.  Psychiatric/Behavioral:  Negative for behavioral problems and confusion.     Vital Signs: BP 126/84   Pulse 95   Temp 99.6 F (37.6 C) (Oral)   Resp 18   Ht 5\' 8"  (1.727 m)   Wt 160 lb (72.6 kg)   SpO2 97%   BMI 24.33 kg/m   Advance Care Plan: The advanced care place/surrogate decision maker was discussed at the time of visit and the patient did not  wish to discuss or was not able to name a surrogate decision maker or provide an advance care plan.  Physical Exam Constitutional:      General: He is not in acute distress.    Appearance: Normal appearance. He is not ill-appearing.  HENT:     Mouth/Throat:     Mouth: Mucous membranes are moist.  Cardiovascular:     Rate and Rhythm: Normal rate and regular rhythm.     Pulses: Normal pulses.     Heart sounds: Normal heart sounds. No murmur heard. Pulmonary:     Effort: Pulmonary effort is normal. No respiratory distress.     Breath sounds: Normal breath sounds. No wheezing.  Abdominal:     Palpations: Abdomen is soft.     Tenderness: There is no abdominal tenderness.  Musculoskeletal:        General: Normal range of motion.  Skin:    General: Skin is warm and dry.  Neurological:     Mental Status: He is alert and oriented to person, place, and time.  Psychiatric:        Mood and Affect: Mood normal.        Behavior: Behavior normal.        Thought Content: Thought content normal.        Judgment: Judgment normal.     Imaging: CT Soft Tissue Neck W Contrast Result Date:  02/10/2023 CLINICAL DATA:  pancreatic mass, abdominal masses; left posterior neck pain EXAM: CT NECK WITH CONTRAST TECHNIQUE: Multidetector CT imaging of the neck was performed using the standard protocol following the bolus administration of intravenous contrast. RADIATION DOSE REDUCTION: This exam was performed according to the departmental dose-optimization program which includes automated exposure control, adjustment of the mA and/or kV according to patient size and/or use of iterative reconstruction technique. CONTRAST:  OMNIPAQUE IOHEXOL 300 MG/ML  SOLN COMPARISON:  None Available. FINDINGS: Pharynx and larynx: Normal. No mass or swelling. Salivary glands: No inflammation, mass, or stone. Thyroid: Normal. Lymph nodes: None enlarged or abnormal density. Vascular: Not well assessed given non arterial timing. Limited intracranial: Negative. Visualized orbits: Negative. Mastoids and visualized paranasal sinuses: Mostly clear. Skeleton: Degenerative changes in the spine including degenerative disc disease and facet/uncovertebral hypertrophy. Thickening of the nuchal ligament, likely degenerative. Upper chest: Visualized lung apices are clear. IMPRESSION: 1. No evidence of acute abnormality. 2. Degenerative changes in the cervical spine. Electronically Signed   By: Feliberto Harts M.D.   On: 02/10/2023 10:26   CT Chest W Contrast Result Date: 02/10/2023 CLINICAL DATA:  Large pancreatic mass. Assess or metastatic disease. * Tracking Code: BO * EXAM: CT CHEST WITH CONTRAST TECHNIQUE: Multidetector CT imaging of the chest was performed during intravenous contrast administration. RADIATION DOSE REDUCTION: This exam was performed according to the departmental dose-optimization program which includes automated exposure control, adjustment of the mA and/or kV according to patient size and/or use of iterative reconstruction technique. CONTRAST:  OMNIPAQUE IOHEXOL 300 MG/ML  SOLN COMPARISON:  CT  abdomen/pelvis 01/28/2023 FINDINGS: Cardiovascular: The heart is normal in size. No pericardial effusion. Mild tortuosity of the thoracic aorta but no aneurysm or dissection. Scattered atherosclerotic calcifications. Three-vessel coronary artery calcifications are noted. Mediastinum/Nodes: No mediastinal or hilar mass or lymphadenopathy. Small scattered lymph nodes are noted. The esophagus is unremarkable. Small amount of debris is noted in the trachea chest above the carina. This is likely mucous. Lungs/Pleura: Moderate eventration of both hemidiaphragms with some overlying vascular crowding and atelectasis, left greater than right.  No worrisome pulmonary lesions or pulmonary nodules to suggest metastatic disease. No pleural effusions or pleural nodules. Upper Abdomen: Large cystic appearing pancreatic mass and adjacent cystic/necrotic peritoneal lesions. Scattered hepatic cysts. No adrenal lesions. Musculoskeletal: Mild asymmetric right-sided gynecomastia. Recommend correlation with physical exam. The bony thorax is intact. No worrisome bone lesions. IMPRESSION: 1. No findings for metastatic disease involving the chest. 2. Moderate eventration of both hemidiaphragms with some overlying vascular crowding and atelectasis, left greater than right. 3. Large cystic appearing pancreatic mass and adjacent cystic/necrotic peritoneal lesions. 4. Mild asymmetric right-sided gynecomastia. Recommend correlation with physical exam. 5. Aortic atherosclerosis. Aortic Atherosclerosis (ICD10-I70.0). Electronically Signed   By: Rudie Meyer M.D.   On: 02/10/2023 10:11   CT ABDOMINAL MASS BIOPSY Result Date: 02/07/2023 INDICATION: 77 year old gentleman with pancreatic mass and left upper quadrant peritoneal implants presents to IR for CT-guided biopsy. EXAM: CT-guided biopsy of left upper quadrant peritoneal implant. TECHNIQUE: Multidetector CT imaging of the abdomen was performed following the standard protocol without IV  contrast. RADIATION DOSE REDUCTION: This exam was performed according to the departmental dose-optimization program which includes automated exposure control, adjustment of the mA and/or kV according to patient size and/or use of iterative reconstruction technique. MEDICATIONS: None. ANESTHESIA/SEDATION: Moderate (conscious) sedation was employed during this procedure. A total of Versed 1 mg and Fentanyl 50 mcg was administered intravenously by the radiology nurse. Total intra-service moderate Sedation Time: 11 minutes. The patient's level of consciousness and vital signs were monitored continuously by radiology nursing throughout the procedure under my direct supervision. COMPLICATIONS: None immediate. PROCEDURE: Informed written consent was obtained from the patient after a thorough discussion of the procedural risks, benefits and alternatives. All questions were addressed. Maximal Sterile Barrier Technique was utilized including caps, mask, sterile gowns, sterile gloves, sterile drape, hand hygiene and skin antiseptic. A timeout was performed prior to the initiation of the procedure. Patient positioned supine on the procedure table. The left upper quadrant skin prepped and draped in the usual sterile fashion. Following local lidocaine administration, 17 gauge introducer needle was advanced into 1 of the left upper quadrant implants utilizing CT guidance. 4-18 gauge cores were obtained and sent to pathology in formalin. Postprocedure CT showed no significant hemorrhage or other complication. IMPRESSION: CT-guided biopsy of left abdominal soft tissue implant. Electronically Signed   By: Acquanetta Belling M.D.   On: 02/07/2023 17:08   CT ABDOMEN PELVIS W CONTRAST Result Date: 01/29/2023 CLINICAL DATA:  Generalized abdominal pain. Unintentional weight loss. * Tracking Code: BO * EXAM: CT ABDOMEN AND PELVIS WITH CONTRAST TECHNIQUE: Multidetector CT imaging of the abdomen and pelvis was performed using the standard  protocol following bolus administration of intravenous contrast. RADIATION DOSE REDUCTION: This exam was performed according to the departmental dose-optimization program which includes automated exposure control, adjustment of the mA and/or kV according to patient size and/or use of iterative reconstruction technique. CONTRAST:  75mL OMNIPAQUE IOHEXOL 350 MG/ML SOLN COMPARISON:  None Available. FINDINGS: Lower Chest: No acute findings. Hepatobiliary: Multiple hepatic cysts are noted. No suspicious hepatic masses identified. Gallbladder is unremarkable. No evidence of biliary ductal dilatation. Pancreas: Necrotic mass with peripheral enhancement is seen involving the pancreatic body and tail which measures 10.1 x 6.3 cm, highly suspicious for pancreatic carcinoma. This mass encases the splenic artery and causes splenic vein thrombosis. No evidence of vascular involvement involving the celiac axis, superior mesenteric artery or veins, or portal vein. Spleen: Within normal limits in size and appearance. Adrenals/Urinary Tract: No suspicious masses identified. No  evidence of ureteral calculi or hydronephrosis. Stomach/Bowel: No evidence of obstruction, inflammatory process or abnormal fluid collections. Diverticulosis is seen mainly involving the descending and sigmoid colon, however there is no evidence of diverticulitis. Vascular/Lymphatic: Necrotic peripancreatic lymph node is seen along the anterior aspect of the pancreatic body measuring 2.4 x 1.9 cm on image 22/3. Multiple left upper quadrant necrotic masses are seen in the gastrosplenic ligament and adjacent to the splenic flexure of the colon, ranging in size from 2.3 cm 24.1 cm in maximum diameter. These findings are consistent with metastatic disease. Reproductive:  Mildly enlarged prostate. Other:  None. Musculoskeletal: No suspicious bone lesions identified. Right hip prosthesis. IMPRESSION: 10 cm necrotic mass involving the pancreatic body and tail, highly  suspicious for pancreatic carcinoma. This mass encases the splenic artery and causes splenic vein thrombosis. Necrotic peripancreatic lymph node, and several necrotic masses in left upper quadrant, consistent with metastatic disease. No evidence of pelvic metastatic disease. Colonic diverticulosis, without radiographic evidence of diverticulitis. Mildly enlarged prostate. Electronically Signed   By: Danae Orleans M.D.   On: 01/29/2023 16:23    Labs:  CBC: Recent Labs    02/06/23 0824 02/07/23 1252  WBC 10.4 11.5*  HGB 12.5* 12.4*  HCT 39.8 39.7  PLT 180 189    COAGS: Recent Labs    02/07/23 1252  INR 1.1    BMP: Recent Labs    02/06/23 0824  NA 138  K 4.3  CL 105  CO2 28  GLUCOSE 140*  BUN 23  CALCIUM 9.9  CREATININE 0.69  GFRNONAA >60    LIVER FUNCTION TESTS: Recent Labs    02/06/23 0824  BILITOT 0.5  AST 14*  ALT 14  ALKPHOS 75  PROT 7.5  ALBUMIN 4.0    TUMOR MARKERS: Recent Labs    02/06/23 0824  CEA 75.60*    Assessment and Plan: Per review of external chart, patient was referred by PCP to Dr. Truett Perna for evaluation of unintentional weight loss (20lbs since 8/24), poor appetite, lethargy, constipation, and abdominal bloating and pain for greater than 2 months. Workup and labs at PCP unremarkable. Last colonoscopy in 2022 notable for removal of tubular adenoma. Upper endoscopy was performed [facility]) with gastric mucosa returning benign. CT A/P w/ contrast on 12/10 notable for 10 cm necrotic mass involving the pancreatic body and tail, highly suspicious for pancreatic carcinoma and several necrotic masses in left upper quadrant, consistent with metastatic disease. Patient underwent LUQ mass biopsy on 12/20, demonstrating metastatic moderately differentiated adenocarcinoma. Plan to begin chemotherapy next week.  Patient presents for scheduled Port-A-Cath placement  in IR today.  Risks and benefits of image guided port-a-catheter placement was discussed  with the patient including, but not limited to bleeding, infection, pneumothorax, or fibrin sheath development and need for additional procedures.  All of the patient's questions were answered, patient is agreeable to proceed.  Consent signed and in chart.  Thank you for allowing our service to participate in Ralph Davis 's care.  Electronically Signed: Sable Feil, PA-C   02/13/2023, 8:29 AM      I spent a total of    15 Minutes in face to face in clinical consultation, greater than 50% of which was counseling/coordinating care for Port-A-Cath placement.

## 2023-02-13 NOTE — Procedures (Signed)
Interventional Radiology Procedure Note  Procedure: Single Lumen Power Port Placement    Access:  Right IJ vein.  Findings: Catheter tip positioned at SVC/RA junction. Port is ready for immediate use.   Complications: None  EBL: < 10 mL  Recommendations:  - Ok to shower in 24 hours - Do not submerge for 7 days - Routine line care   Lashauna Arpin T. Jacobs Golab, M.D Pager:  [phone]   

## 2023-02-13 NOTE — Progress Notes (Signed)
Patient was given discharge instructions. Both verbalized understanding. 

## 2023-02-14 ENCOUNTER — Other Ambulatory Visit: Payer: Self-pay | Admitting: *Deleted

## 2023-02-14 ENCOUNTER — Inpatient Hospital Stay: Payer: Medicare Other

## 2023-02-14 ENCOUNTER — Other Ambulatory Visit: Payer: Self-pay

## 2023-02-14 MED ORDER — ONDANSETRON HCL 8 MG PO TABS: 8.0000 mg | ORAL_TABLET | Freq: Three times a day (TID) | ORAL | 0 refills | Status: AC | PRN

## 2023-02-14 MED ORDER — LIDOCAINE-PRILOCAINE 2.5-2.5 % EX CREA: 1.0000 | TOPICAL_CREAM | CUTANEOUS | 2 refills | Status: AC | PRN

## 2023-02-14 MED ORDER — PROCHLORPERAZINE MALEATE 10 MG PO TABS: 10.0000 mg | ORAL_TABLET | Freq: Four times a day (QID) | ORAL | 0 refills | Status: DC | PRN

## 2023-02-14 NOTE — Progress Notes (Signed)
Anti nausea meds and EMLA cream escribed in

## 2023-02-15 ENCOUNTER — Other Ambulatory Visit: Payer: Self-pay | Admitting: Oncology

## 2023-02-17 ENCOUNTER — Inpatient Hospital Stay: Payer: Medicare Other

## 2023-02-17 ENCOUNTER — Other Ambulatory Visit: Payer: Medicare Other

## 2023-02-17 ENCOUNTER — Ambulatory Visit (HOSPITAL_COMMUNITY): Payer: Medicare Other

## 2023-02-17 DIAGNOSIS — C252 Malignant neoplasm of tail of pancreas: Secondary | ICD-10-CM

## 2023-02-17 DIAGNOSIS — I82890 Acute embolism and thrombosis of other specified veins: Secondary | ICD-10-CM | POA: Diagnosis not present

## 2023-02-17 DIAGNOSIS — C7989 Secondary malignant neoplasm of other specified sites: Secondary | ICD-10-CM | POA: Diagnosis not present

## 2023-02-17 DIAGNOSIS — C258 Malignant neoplasm of overlapping sites of pancreas: Secondary | ICD-10-CM | POA: Diagnosis not present

## 2023-02-17 DIAGNOSIS — Z5111 Encounter for antineoplastic chemotherapy: Secondary | ICD-10-CM | POA: Diagnosis not present

## 2023-02-17 DIAGNOSIS — I10 Essential (primary) hypertension: Secondary | ICD-10-CM | POA: Diagnosis not present

## 2023-02-17 LAB — CBC WITH DIFFERENTIAL (CANCER CENTER ONLY)
Abs Immature Granulocytes: 0.04 10*3/uL (ref 0.00–0.07)
Basophils Absolute: 0.1 10*3/uL (ref 0.0–0.1)
Basophils Relative: 1 %
Eosinophils Absolute: 0.1 10*3/uL (ref 0.0–0.5)
Eosinophils Relative: 1 %
HCT: 37.4 % — ABNORMAL LOW (ref 39.0–52.0)
Hemoglobin: 11.9 g/dL — ABNORMAL LOW (ref 13.0–17.0)
Immature Granulocytes: 0 %
Lymphocytes Relative: 15 %
Lymphs Abs: 1.4 10*3/uL (ref 0.7–4.0)
MCH: 27.7 pg (ref 26.0–34.0)
MCHC: 31.8 g/dL (ref 30.0–36.0)
MCV: 87.2 fL (ref 80.0–100.0)
Monocytes Absolute: 0.7 10*3/uL (ref 0.1–1.0)
Monocytes Relative: 7 %
Neutro Abs: 7.1 10*3/uL (ref 1.7–7.7)
Neutrophils Relative %: 76 %
Platelet Count: 200 10*3/uL (ref 150–400)
RBC: 4.29 MIL/uL (ref 4.22–5.81)
RDW: 14.1 % (ref 11.5–15.5)
WBC Count: 9.3 10*3/uL (ref 4.0–10.5)
nRBC: 0 % (ref 0.0–0.2)

## 2023-02-17 LAB — CMP (CANCER CENTER ONLY)
ALT: 18 U/L (ref 0–44)
AST: 17 U/L (ref 15–41)
Albumin: 3.9 g/dL (ref 3.5–5.0)
Alkaline Phosphatase: 75 U/L (ref 38–126)
Anion gap: 10 (ref 5–15)
BUN: 13 mg/dL (ref 8–23)
CO2: 27 mmol/L (ref 22–32)
Calcium: 9.6 mg/dL (ref 8.9–10.3)
Chloride: 101 mmol/L (ref 98–111)
Creatinine: 0.56 mg/dL — ABNORMAL LOW (ref 0.61–1.24)
GFR, Estimated: >60 mL/min (ref >60)
Glucose, Bld: 138 mg/dL — ABNORMAL HIGH (ref 70–99)
Potassium: 4.1 mmol/L (ref 3.5–5.1)
Sodium: 138 mmol/L (ref 135–145)
Total Bilirubin: 0.5 mg/dL (ref 0.0–1.2)
Total Protein: 7.5 g/dL (ref 6.5–8.1)

## 2023-02-17 NOTE — Progress Notes (Signed)
 Pharmacist Chemotherapy Monitoring - Initial Assessment    Anticipated start date: 02/18/23   The following has been reviewed per standard work regarding the patient's treatment regimen: The patient's diagnosis, treatment plan and drug doses, and organ/hematologic function Lab orders and baseline tests specific to treatment regimen  The treatment plan start date, drug sequencing, and pre-medications Prior authorization status  Patient's documented medication list, including drug-drug interaction screen and prescriptions for anti-emetics and supportive care specific to the treatment regimen The drug concentrations, fluid compatibility, administration routes, and timing of the medications to be used The patient's access for treatment and lifetime cumulative dose history, if applicable  The patient's medication allergies and previous infusion related reactions, if applicable   Changes made to treatment plan:  N/A  Follow up needed:  N/A   Ralph Davis, RPH, 02/17/2023  11:35 AM

## 2023-02-18 ENCOUNTER — Other Ambulatory Visit: Payer: Self-pay | Admitting: Nurse Practitioner

## 2023-02-18 ENCOUNTER — Inpatient Hospital Stay: Payer: Medicare Other

## 2023-02-18 VITALS — BP 131/70 | HR 81 | Temp 98.8°F | Resp 18 | Ht 68.0 in | Wt 160.4 lb

## 2023-02-18 DIAGNOSIS — I10 Essential (primary) hypertension: Secondary | ICD-10-CM | POA: Diagnosis not present

## 2023-02-18 DIAGNOSIS — C7989 Secondary malignant neoplasm of other specified sites: Secondary | ICD-10-CM | POA: Diagnosis not present

## 2023-02-18 DIAGNOSIS — C252 Malignant neoplasm of tail of pancreas: Secondary | ICD-10-CM

## 2023-02-18 DIAGNOSIS — R1900 Intra-abdominal and pelvic swelling, mass and lump, unspecified site: Secondary | ICD-10-CM

## 2023-02-18 DIAGNOSIS — C258 Malignant neoplasm of overlapping sites of pancreas: Secondary | ICD-10-CM | POA: Diagnosis not present

## 2023-02-18 DIAGNOSIS — I82890 Acute embolism and thrombosis of other specified veins: Secondary | ICD-10-CM | POA: Diagnosis not present

## 2023-02-18 DIAGNOSIS — Z5111 Encounter for antineoplastic chemotherapy: Secondary | ICD-10-CM | POA: Diagnosis not present

## 2023-02-18 MED ORDER — LEUCOVORIN CALCIUM INJECTION 350 MG
400.0000 mg/m2 | Freq: Once | INTRAVENOUS | Status: AC
  Administered 2023-02-18: 760 mg via INTRAVENOUS
  Filled 2023-02-18: qty 38

## 2023-02-18 MED ORDER — OXYCODONE HCL 5 MG PO TABS
5.0000 mg | ORAL_TABLET | Freq: Once | ORAL | Status: AC
  Administered 2023-02-18: 5 mg via ORAL
  Filled 2023-02-18: qty 1

## 2023-02-18 MED ORDER — DEXAMETHASONE SODIUM PHOSPHATE 10 MG/ML IJ SOLN
10.0000 mg | Freq: Once | INTRAMUSCULAR | Status: AC
  Administered 2023-02-18: 10 mg via INTRAVENOUS
  Filled 2023-02-18: qty 1

## 2023-02-18 MED ORDER — FLUOROURACIL CHEMO INJECTION 5 GM/100ML
2400.0000 mg/m2 | INTRAVENOUS | Status: DC
  Administered 2023-02-18: 5000 mg via INTRAVENOUS
  Filled 2023-02-18: qty 100

## 2023-02-18 MED ORDER — OXYCODONE HCL 5 MG PO TABS: 5.0000 mg | ORAL_TABLET | ORAL | 0 refills | Status: DC | PRN

## 2023-02-18 MED ORDER — IRINOTECAN HCL LIPOSOME CHEMO INJECTION 43 MG/10ML
86.0000 mg | INJECTION | Freq: Once | INTRAVENOUS | Status: AC
  Administered 2023-02-18: 86 mg via INTRAVENOUS
  Filled 2023-02-18: qty 20

## 2023-02-18 MED ORDER — OXALIPLATIN CHEMO INJECTION 100 MG/20ML
60.0000 mg/m2 | Freq: Once | INTRAVENOUS | Status: AC
Start: 2023-02-18 — End: 2023-02-18
  Administered 2023-02-18: 115 mg via INTRAVENOUS
  Filled 2023-02-18: qty 20

## 2023-02-18 MED ORDER — PALONOSETRON HCL INJECTION 0.25 MG/5ML
0.2500 mg | Freq: Once | INTRAVENOUS | Status: AC
  Administered 2023-02-18: 0.25 mg via INTRAVENOUS
  Filled 2023-02-18: qty 5

## 2023-02-18 MED ORDER — DEXTROSE 5 % IV SOLN: INTRAVENOUS | Status: DC

## 2023-02-18 NOTE — Patient Instructions (Addendum)
 [facility] - A DEPT OF [place]  Discharge Instructions: Thank you for choosing [facility] to provide your oncology and hematology care.   If you have a lab appointment with the Cancer Center, please go directly to the Cancer Center and check in at the registration area.   Wear comfortable clothing and clothing appropriate for easy access to any Portacath or PICC line.   We strive to give you quality time with your provider. You may need to reschedule your appointment if you arrive late (15 or more minutes).  Arriving late affects you and other patients whose appointments are after yours.  Also, if you miss three or more appointments without notifying the office, you may be dismissed from the clinic at the provider's discretion.      For prescription refill requests, have your pharmacy contact our office and allow 72 hours for refills to be completed.    Today you received the following chemotherapy and/or immunotherapy agents Irinotecan  Liposome, Oxaliplatin , Leucovorin  and Fluorouracil       To help prevent nausea and vomiting after your treatment, we encourage you to take your nausea medication as directed. No Zofran  for 3 days, take Compazine  instead.  BELOW ARE SYMPTOMS THAT SHOULD BE REPORTED IMMEDIATELY: *FEVER GREATER THAN 100.4 F (38 C) OR HIGHER *CHILLS OR SWEATING *NAUSEA AND VOMITING THAT IS NOT CONTROLLED WITH YOUR NAUSEA MEDICATION *UNUSUAL SHORTNESS OF BREATH *UNUSUAL BRUISING OR BLEEDING *URINARY PROBLEMS (pain or burning when urinating, or frequent urination) *BOWEL PROBLEMS (unusual diarrhea, constipation, pain near the anus) TENDERNESS IN MOUTH AND THROAT WITH OR WITHOUT PRESENCE OF ULCERS (sore throat, sores in mouth, or a toothache) UNUSUAL RASH, SWELLING OR PAIN  UNUSUAL VAGINAL DISCHARGE OR ITCHING   Items with * indicate a potential emergency and should be followed up as soon as possible or go to the Emergency  Department if any problems should occur.  Please show the CHEMOTHERAPY ALERT CARD or IMMUNOTHERAPY ALERT CARD at check-in to the Emergency Department and triage nurse.  Should you have questions after your visit or need to cancel or reschedule your appointment, please contact [facility] - A DEPT OF [facility]  Dept: [phone]  and follow the prompts.  Office hours are 8:00 a.m. to 4:30 p.m. Monday - Friday. Please note that voicemails left after 4:00 p.m. may not be returned until the following business day.  We are closed weekends and major holidays. You have access to a nurse at all times for urgent questions. Please call the main number to the clinic Dept: [phone] and follow the prompts.   For any non-urgent questions, you may also contact your provider using MyChart. We now offer e-Visits for anyone 26 and older to request care online for non-urgent symptoms. For details visit [email or web address].   Also download the MyChart app! Go to the app store, search MyChart, open the app, select , and log in with your MyChart username and password.  Irinotecan  Liposome Injection What is this medication? IRINOTECAN  LIPOSOME (eye ri noe TEE kan LIP oh som) treats pancreatic cancer. It works by slowing down the growth of cancer cells. This medicine may be used for other purposes; ask your health care provider or pharmacist if you have questions. COMMON BRAND NAME(S): ONIVYDE  What should I tell my care team before I take this medication? They need to know if you have any of these conditions: Blockage in your bowels Dehydration Infection Low  white blood cell levels Lung disease An unusual or allergic reaction to irinotecan  liposome, irinotecan , other medications, foods, dyes, or preservatives Pregnant or trying to get pregnant Breast-feeding How should I use this medication? This medication is injected into a vein. It is given by your care  team in a hospital or clinic setting. Talk to your care team about the use of this medication in children. Special care may be needed. Overdosage: If you think you have taken too much of this medicine contact a poison control center or emergency room at once. NOTE: This medicine is only for you. Do not share this medicine with others. What if I miss a dose? Keep appointments for follow-up doses. It is important not to miss your dose. Call your care team if you are unable to keep an appointment. What may interact with this medication? Do not take this medication with any of the following: Itraconazole This medication may also interact with the following: Certain antivirals for HIV or AIDS Certain medications for seizures, such as carbamazepine, fosphenytoin, phenytoin, phenobarbital Clarithromycin Gemfibrozil Nefazodone Rifabutin Rifampin Rifapentine St. John's Wort Voriconazole This list may not describe all possible interactions. Give your health care provider a list of all the medicines, herbs, non-prescription drugs, or dietary supplements you use. Also tell them if you smoke, drink alcohol, or use illegal drugs. Some items may interact with your medicine. What should I watch for while using this medication? This medication may make you feel generally unwell. This is not uncommon as chemotherapy can affect healthy cells as well as cancer cells. Report any side effects. Continue your course of treatment even though you feel ill unless your care team tells you to stop. You may need blood work while you are taking this medication. This medication can cause serious side effects and allergic reactions. To reduce your risk, your care team may give you other medications to take before receiving this one. Be sure to follow the directions from your care team. Check with your care team if you get an attack of diarrhea, nausea and vomiting, or if you sweat a lot. The loss of too much body fluid can  make it dangerous for you to take this medication. This medication may cause infertility. Talk to your care team if you are concerned about your fertility. Talk to your care team if you wish to become pregnant or if you think you might be pregnant. This medication can cause serious birth defects if taken during pregnancy or if you get pregnant within 7 months after stopping therapy. A negative pregnancy test is required before starting this medication. A reliable form of contraception is recommended while taking this medication and for 7 months after stopping it. Talk to your care team about reliable forms of contraception. Use a condom during sex and for 4 months after stopping therapy. Tell your care team right away if you think your partner might be pregnant. This medication can cause serious birth defects. Do not breast-feed while taking this medication and for 1 month after stopping therapy. This medication may increase your risk of getting an infection. Call your care team for advice if you get a fever, chills, sore throat, or other symptoms of a cold or flu. Do not treat yourself. Try to avoid being around people who are sick. Avoid taking medications that contain aspirin , acetaminophen , ibuprofen, naproxen, or ketoprofen unless instructed by your care team. These medications may hide a fever. Be careful brushing or flossing your teeth or using a  toothpick because you may get an infection or bleed more easily. If you have any dental work done, tell your dentist you are receiving this medication. What side effects may I notice from receiving this medication? Side effects that you should report to your care team as soon as possible: Allergic reactions or angioedema--skin rash, itching or hives, swelling of the face, eyes, lips, tongue, arms, or legs, trouble swallowing or breathing Dry cough, shortness of breath or trouble breathing Diarrhea Infection--fever, chills, cough, or sore throat Side  effects that usually do not require medical attention (report to your care team if they continue or are bothersome): Fatigue Loss of appetite Nausea Pain, redness, or swelling with sores inside the mouth or throat Vomiting This list may not describe all possible side effects. Call your doctor for medical advice about side effects. You may report side effects to FDA at 1-800-FDA-1088. Where should I keep my medication? This medication is given in a hospital or clinic. It will not be stored at home. NOTE: This sheet is a summary. It may not cover all possible information. If you have questions about this medicine, talk to your doctor, pharmacist, or health care provider.  2024 Elsevier/Gold Standard (2021-04-05 00:00:00)  Oxaliplatin  Injection What is this medication? OXALIPLATIN  (ox AL i PLA tin) treats colorectal cancer. It works by slowing down the growth of cancer cells. This medicine may be used for other purposes; ask your health care provider or pharmacist if you have questions. COMMON BRAND NAME(S): Eloxatin  What should I tell my care team before I take this medication? They need to know if you have any of these conditions: Heart disease History of irregular heartbeat or rhythm Liver disease Low blood cell levels (white cells, red cells, and platelets) Lung or breathing disease, such as asthma Take medications that treat or prevent blood clots Tingling of the fingers, toes, or other nerve disorder An unusual or allergic reaction to oxaliplatin , other medications, foods, dyes, or preservatives If you or your partner are pregnant or trying to get pregnant Breast-feeding How should I use this medication? This medication is injected into a vein. It is given by your care team in a hospital or clinic setting. Talk to your care team about the use of this medication in children. Special care may be needed. Overdosage: If you think you have taken too much of this medicine contact a  poison control center or emergency room at once. NOTE: This medicine is only for you. Do not share this medicine with others. What if I miss a dose? Keep appointments for follow-up doses. It is important not to miss a dose. Call your care team if you are unable to keep an appointment. What may interact with this medication? Do not take this medication with any of the following: Cisapride Dronedarone Pimozide Thioridazine This medication may also interact with the following: Aspirin  and aspirin -like medications Certain medications that treat or prevent blood clots, such as warfarin, apixaban, dabigatran, and rivaroxaban Cisplatin Cyclosporine Diuretics Medications for infection, such as acyclovir, adefovir, amphotericin B, bacitracin, cidofovir, foscarnet, ganciclovir, gentamicin, pentamidine, vancomycin NSAIDs, medications for pain and inflammation, such as ibuprofen or naproxen Other medications that cause heart rhythm changes Pamidronate Zoledronic acid This list may not describe all possible interactions. Give your health care provider a list of all the medicines, herbs, non-prescription drugs, or dietary supplements you use. Also tell them if you smoke, drink alcohol, or use illegal drugs. Some items may interact with your medicine. What should I watch  for while using this medication? Your condition will be monitored carefully while you are receiving this medication. You may need blood work while taking this medication. This medication may make you feel generally unwell. This is not uncommon as chemotherapy can affect healthy cells as well as cancer cells. Report any side effects. Continue your course of treatment even though you feel ill unless your care team tells you to stop. This medication may increase your risk of getting an infection. Call your care team for advice if you get a fever, chills, sore throat, or other symptoms of a cold or flu. Do not treat yourself. Try to avoid  being around people who are sick. Avoid taking medications that contain aspirin , acetaminophen , ibuprofen, naproxen, or ketoprofen unless instructed by your care team. These medications may hide a fever. Be careful brushing or flossing your teeth or using a toothpick because you may get an infection or bleed more easily. If you have any dental work done, tell your dentist you are receiving this medication. This medication can make you more sensitive to cold. Do not drink cold drinks or use ice. Cover exposed skin before coming in contact with cold temperatures or cold objects. When out in cold weather wear warm clothing and cover your mouth and nose to warm the air that goes into your lungs. Tell your care team if you get sensitive to the cold. Talk to your care team if you or your partner are pregnant or think either of you might be pregnant. This medication can cause serious birth defects if taken during pregnancy and for 9 months after the last dose. A negative pregnancy test is required before starting this medication. A reliable form of contraception is recommended while taking this medication and for 9 months after the last dose. Talk to your care team about effective forms of contraception. Do not father a child while taking this medication and for 6 months after the last dose. Use a condom while having sex during this time period. Do not breastfeed while taking this medication and for 3 months after the last dose. This medication may cause infertility. Talk to your care team if you are concerned about your fertility. What side effects may I notice from receiving this medication? Side effects that you should report to your care team as soon as possible: Allergic reactions--skin rash, itching, hives, swelling of the face, lips, tongue, or throat Bleeding--bloody or black, tar-like stools, vomiting blood or brown material that looks like coffee grounds, red or dark brown urine, small red or purple  spots on skin, unusual bruising or bleeding Dry cough, shortness of breath or trouble breathing Heart rhythm changes--fast or irregular heartbeat, dizziness, feeling faint or lightheaded, chest pain, trouble breathing Infection--fever, chills, cough, sore throat, wounds that don't heal, pain or trouble when passing urine, general feeling of discomfort or being unwell Liver injury--right upper belly pain, loss of appetite, nausea, light-colored stool, dark yellow or brown urine, yellowing skin or eyes, unusual weakness or fatigue Low red blood cell level--unusual weakness or fatigue, dizziness, headache, trouble breathing Muscle injury--unusual weakness or fatigue, muscle pain, dark yellow or brown urine, decrease in amount of urine Pain, tingling, or numbness in the hands or feet Sudden and severe headache, confusion, change in vision, seizures, which may be signs of posterior reversible encephalopathy syndrome (PRES) Unusual bruising or bleeding Side effects that usually do not require medical attention (report to your care team if they continue or are bothersome): Diarrhea Nausea Pain, redness,  or swelling with sores inside the mouth or throat Unusual weakness or fatigue Vomiting This list may not describe all possible side effects. Call your doctor for medical advice about side effects. You may report side effects to FDA at 1-800-FDA-1088. Where should I keep my medication? This medication is given in a hospital or clinic. It will not be stored at home. NOTE: This sheet is a summary. It may not cover all possible information. If you have questions about this medicine, talk to your doctor, pharmacist, or health care provider.  2024 Elsevier/Gold Standard (2021-11-20 00:00:00)  Leucovorin  Injection What is this medication? LEUCOVORIN  (loo koe VOR in) prevents side effects from certain medications, such as methotrexate. It works by increasing folate levels. This helps protect healthy cells  in your body. It may also be used to treat anemia caused by low levels of folate. It can also be used with fluorouracil , a type of chemotherapy, to treat colorectal cancer. It works by increasing the effects of fluorouracil  in the body. This medicine may be used for other purposes; ask your health care provider or pharmacist if you have questions. What should I tell my care team before I take this medication? They need to know if you have any of these conditions: Anemia from low levels of vitamin B12 in the blood An unusual or allergic reaction to leucovorin , folic acid, other medications, foods, dyes, or preservatives Pregnant or trying to get pregnant Breastfeeding How should I use this medication? This medication is injected into a vein or a muscle. It is given by your care team in a hospital or clinic setting. Talk to your care team about the use of this medication in children. Special care may be needed. Overdosage: If you think you have taken too much of this medicine contact a poison control center or emergency room at once. NOTE: This medicine is only for you. Do not share this medicine with others. What if I miss a dose? Keep appointments for follow-up doses. It is important not to miss your dose. Call your care team if you are unable to keep an appointment. What may interact with this medication? Capecitabine Fluorouracil  Phenobarbital Phenytoin Primidone Trimethoprim;sulfamethoxazole This list may not describe all possible interactions. Give your health care provider a list of all the medicines, herbs, non-prescription drugs, or dietary supplements you use. Also tell them if you smoke, drink alcohol, or use illegal drugs. Some items may interact with your medicine. What should I watch for while using this medication? Your condition will be monitored carefully while you are receiving this medication. This medication may increase the side effects of 5-fluorouracil . Tell your care  team if you have diarrhea or mouth sores that do not get better or that get worse. What side effects may I notice from receiving this medication? Side effects that you should report to your care team as soon as possible: Allergic reactions--skin rash, itching, hives, swelling of the face, lips, tongue, or throat This list may not describe all possible side effects. Call your doctor for medical advice about side effects. You may report side effects to FDA at 1-800-FDA-1088. Where should I keep my medication? This medication is given in a hospital or clinic. It will not be stored at home. NOTE: This sheet is a summary. It may not cover all possible information. If you have questions about this medicine, talk to your doctor, pharmacist, or health care provider.  2024 Elsevier/Gold Standard (2021-07-10 00:00:00)  Fluorouracil  Injection What is this medication? FLUOROURACIL  (  flure oh YOOR a sil) treats some types of cancer. It works by slowing down the growth of cancer cells. This medicine may be used for other purposes; ask your health care provider or pharmacist if you have questions. COMMON BRAND NAME(S): Adrucil  What should I tell my care team before I take this medication? They need to know if you have any of these conditions: Blood disorders Dihydropyrimidine dehydrogenase (DPD) deficiency Infection, such as chickenpox, cold sores, herpes Kidney disease Liver disease Poor nutrition Recent or ongoing radiation therapy An unusual or allergic reaction to fluorouracil , other medications, foods, dyes, or preservatives If you or your partner are pregnant or trying to get pregnant Breast-feeding How should I use this medication? This medication is injected into a vein. It is administered by your care team in a hospital or clinic setting. Talk to your care team about the use of this medication in children. Special care may be needed. Overdosage: If you think you have taken too much of this  medicine contact a poison control center or emergency room at once. NOTE: This medicine is only for you. Do not share this medicine with others. What if I miss a dose? Keep appointments for follow-up doses. It is important not to miss your dose. Call your care team if you are unable to keep an appointment. What may interact with this medication? Do not take this medication with any of the following: Live virus vaccines This medication may also interact with the following: Medications that treat or prevent blood clots, such as warfarin, enoxaparin, dalteparin This list may not describe all possible interactions. Give your health care provider a list of all the medicines, herbs, non-prescription drugs, or dietary supplements you use. Also tell them if you smoke, drink alcohol, or use illegal drugs. Some items may interact with your medicine. What should I watch for while using this medication? Your condition will be monitored carefully while you are receiving this medication. This medication may make you feel generally unwell. This is not uncommon as chemotherapy can affect healthy cells as well as cancer cells. Report any side effects. Continue your course of treatment even though you feel ill unless your care team tells you to stop. In some cases, you may be given additional medications to help with side effects. Follow all directions for their use. This medication may increase your risk of getting an infection. Call your care team for advice if you get a fever, chills, sore throat, or other symptoms of a cold or flu. Do not treat yourself. Try to avoid being around people who are sick. This medication may increase your risk to bruise or bleed. Call your care team if you notice any unusual bleeding. Be careful brushing or flossing your teeth or using a toothpick because you may get an infection or bleed more easily. If you have any dental work done, tell your dentist you are receiving this  medication. Avoid taking medications that contain aspirin , acetaminophen , ibuprofen, naproxen, or ketoprofen unless instructed by your care team. These medications may hide a fever. Do not treat diarrhea with over the counter products. Contact your care team if you have diarrhea that lasts more than 2 days or if it is severe and watery. This medication can make you more sensitive to the sun. Keep out of the sun. If you cannot avoid being in the sun, wear protective clothing and sunscreen. Do not use sun lamps, tanning beds, or tanning booths. Talk to your care team if you or  your partner wish to become pregnant or think you might be pregnant. This medication can cause serious birth defects if taken during pregnancy and for 3 months after the last dose. A reliable form of contraception is recommended while taking this medication and for 3 months after the last dose. Talk to your care team about effective forms of contraception. Do not father a child while taking this medication and for 3 months after the last dose. Use a condom while having sex during this time period. Do not breastfeed while taking this medication. This medication may cause infertility. Talk to your care team if you are concerned about your fertility. What side effects may I notice from receiving this medication? Side effects that you should report to your care team as soon as possible: Allergic reactions--skin rash, itching, hives, swelling of the face, lips, tongue, or throat Heart attack--pain or tightness in the chest, shoulders, arms, or jaw, nausea, shortness of breath, cold or clammy skin, feeling faint or lightheaded Heart failure--shortness of breath, swelling of the ankles, feet, or hands, sudden weight gain, unusual weakness or fatigue Heart rhythm changes--fast or irregular heartbeat, dizziness, feeling faint or lightheaded, chest pain, trouble breathing High ammonia level--unusual weakness or fatigue, confusion, loss of  appetite, nausea, vomiting, seizures Infection--fever, chills, cough, sore throat, wounds that don't heal, pain or trouble when passing urine, general feeling of discomfort or being unwell Low red blood cell level--unusual weakness or fatigue, dizziness, headache, trouble breathing Pain, tingling, or numbness in the hands or feet, muscle weakness, change in vision, confusion or trouble speaking, loss of balance or coordination, trouble walking, seizures Redness, swelling, and blistering of the skin over hands and feet Severe or prolonged diarrhea Unusual bruising or bleeding Side effects that usually do not require medical attention (report to your care team if they continue or are bothersome): Dry skin Headache Increased tears Nausea Pain, redness, or swelling with sores inside the mouth or throat Sensitivity to light Vomiting This list may not describe all possible side effects. Call your doctor for medical advice about side effects. You may report side effects to FDA at 1-800-FDA-1088. Where should I keep my medication? This medication is given in a hospital or clinic. It will not be stored at home. NOTE: This sheet is a summary. It may not cover all possible information. If you have questions about this medicine, talk to your doctor, pharmacist, or health care provider.  2024 Elsevier/Gold Standard (2021-06-12 00:00:00)

## 2023-02-18 NOTE — Progress Notes (Signed)
 During infusion, pt reports chronic back pain increasing, see flowsheets. Pt verbally discussed with [name], NP. Per [name], NP pain medication given (see eMAR). Pt reports pain improved (see flowsheets). Will continue to monitor.

## 2023-02-19 ENCOUNTER — Other Ambulatory Visit: Payer: Self-pay

## 2023-02-20 ENCOUNTER — Inpatient Hospital Stay: Payer: Medicare Other | Attending: Nurse Practitioner

## 2023-02-20 ENCOUNTER — Inpatient Hospital Stay: Payer: Medicare Other

## 2023-02-20 VITALS — BP 111/63 | HR 92 | Temp 98.2°F | Resp 18

## 2023-02-20 DIAGNOSIS — Z5111 Encounter for antineoplastic chemotherapy: Secondary | ICD-10-CM | POA: Insufficient documentation

## 2023-02-20 DIAGNOSIS — C258 Malignant neoplasm of overlapping sites of pancreas: Secondary | ICD-10-CM | POA: Diagnosis not present

## 2023-02-20 DIAGNOSIS — C7989 Secondary malignant neoplasm of other specified sites: Secondary | ICD-10-CM | POA: Diagnosis not present

## 2023-02-20 DIAGNOSIS — C252 Malignant neoplasm of tail of pancreas: Secondary | ICD-10-CM

## 2023-02-20 DIAGNOSIS — I10 Essential (primary) hypertension: Secondary | ICD-10-CM | POA: Insufficient documentation

## 2023-02-20 DIAGNOSIS — Z452 Encounter for adjustment and management of vascular access device: Secondary | ICD-10-CM | POA: Diagnosis not present

## 2023-02-20 MED ORDER — HEPARIN SOD (PORK) LOCK FLUSH 100 UNIT/ML IV SOLN
500.0000 [IU] | Freq: Once | INTRAVENOUS | Status: AC | PRN
  Administered 2023-02-20: 500 [IU]

## 2023-02-20 MED ORDER — SODIUM CHLORIDE 0.9% FLUSH
10.0000 mL | INTRAVENOUS | Status: DC | PRN
  Administered 2023-02-20: 10 mL

## 2023-02-20 NOTE — Progress Notes (Signed)
 24 Hour Callback Patient in for pumpstop 24 Hour Callback for 1st time Narlirifox due to holiday. Pt reports he had done well, does have ongoing decrease of appetite and fluid intake, denies any nausea or vomiting. Patient encouraged to try to take sips of water  or beverage often to increase fluid intake. Pt to call with any questions or concerns.

## 2023-02-20 NOTE — Patient Instructions (Signed)

## 2023-02-21 ENCOUNTER — Telehealth: Payer: Self-pay

## 2023-02-21 ENCOUNTER — Encounter: Payer: Self-pay | Admitting: *Deleted

## 2023-02-21 ENCOUNTER — Inpatient Hospital Stay: Payer: Medicare Other

## 2023-02-21 NOTE — Progress Notes (Signed)
 Received fax from [facility] on 02/15/23 that specimen has been received for testing.

## 2023-02-21 NOTE — Telephone Encounter (Signed)
 [facility] Clinical Social Work  Clinical Social Work was referred by new patient protocol for assessment of psychosocial needs.  Clinical Social Worker attempted to contact patient by phone to offer support and assess for needs.   CSW was unable to reach patient, left vm with direct contact.    Marguerita Merles, LCSW  Clinical Social Worker [facility]

## 2023-02-24 ENCOUNTER — Other Ambulatory Visit: Payer: Self-pay | Admitting: Nurse Practitioner

## 2023-02-24 ENCOUNTER — Telehealth: Payer: Self-pay | Admitting: Nurse Practitioner

## 2023-02-24 ENCOUNTER — Telehealth: Payer: Self-pay

## 2023-02-24 DIAGNOSIS — R1900 Intra-abdominal and pelvic swelling, mass and lump, unspecified site: Secondary | ICD-10-CM

## 2023-02-24 DIAGNOSIS — C252 Malignant neoplasm of tail of pancreas: Secondary | ICD-10-CM

## 2023-02-24 DIAGNOSIS — K8689 Other specified diseases of pancreas: Secondary | ICD-10-CM

## 2023-02-24 DIAGNOSIS — M542 Cervicalgia: Secondary | ICD-10-CM

## 2023-02-24 MED ORDER — HYDROCODONE-ACETAMINOPHEN 5-325 MG PO TABS: 1.0000 | ORAL_TABLET | Freq: Four times a day (QID) | ORAL | 0 refills | Status: DC | PRN

## 2023-02-24 NOTE — Telephone Encounter (Signed)
 I called Ralph Davis for clarification on his request regarding pain medication.  He would like to switch back to hydrocodone  for pain.  He thinks he may have acted too quickly last week when he requested a stronger pain medication.  He feels the hydrocodone  is adequate for pain control.  I sent a new prescription to his pharmacy.  He understands he should not take both pain medications.

## 2023-02-24 NOTE — Telephone Encounter (Signed)
 The patient called to request a refill for his hydrocodone 5 mg. He expressed a preference for hydrocodone 5 mg over oxycodone 5 mg.

## 2023-02-25 DIAGNOSIS — C259 Malignant neoplasm of pancreas, unspecified: Secondary | ICD-10-CM | POA: Diagnosis not present

## 2023-02-26 ENCOUNTER — Inpatient Hospital Stay: Payer: Medicare Other | Admitting: Nutrition

## 2023-02-26 NOTE — Progress Notes (Signed)
 78 yo male diagnosed with met pancreas cancer and followed by Dr. Cloretta. S/P 1st cycle Nalirifox.  PMH includes HTN, Arthritis, Sleep Apnea.  Medications include CoQ 10, MVI, Omega 3 fatty acid, Zofran , Compazine , Turmeric.  Labs include Glucose 138 and Creatinine 0.56.  Height: 68 inches. Weight: 160 pounds 6 oz on Dec 31. LAT:Tzphyzi 166 pounds Dec 26. BMI: 24.38.  Reports constipation has improved. Had one day of diarrhea which resolved after taking Imodium. Has a hard time distinguishing between nausea and pain. Pain is well controlled now on pain medication QID. Poor appetite and early satiety resulted in decreased intake and weight loss. Reports his taste has improved and he has found some things which taste good now. Concerned with weight loss. States he weighed 149 pounds on his home scale.  Nutrition Diagnosis: Unintended weight loss related to inadequate oral intake as evidenced by 4% weight loss in less than one week.  Intervention: Educated on strategies for eating small, frequent meals/snacks with high calorie, high protein foods. Continue ONS three times daily. Provided coupons. Reviewed strategies to have small snacks available for patient to easily put together. Reviewed foods and beverages to use after Oxaliplatin . Provided nutrition fact sheets and recipes. Questions answered and contact information given.  Monitoring, Evaluation, Goals: Tolerate increased calories and protein to minimize further weight loss.  Next Visit: Possibly on Jan 23 in conjunction with a Reiki appointment at [facility]. Family will call and confirm.

## 2023-02-27 ENCOUNTER — Encounter: Payer: Self-pay | Admitting: Oncology

## 2023-02-28 ENCOUNTER — Encounter: Payer: Self-pay | Admitting: Gastroenterology

## 2023-03-01 ENCOUNTER — Other Ambulatory Visit: Payer: Self-pay | Admitting: Oncology

## 2023-03-04 DIAGNOSIS — C259 Malignant neoplasm of pancreas, unspecified: Secondary | ICD-10-CM | POA: Diagnosis not present

## 2023-03-04 DIAGNOSIS — G4733 Obstructive sleep apnea (adult) (pediatric): Secondary | ICD-10-CM | POA: Diagnosis not present

## 2023-03-05 ENCOUNTER — Other Ambulatory Visit: Payer: Self-pay | Admitting: Nurse Practitioner

## 2023-03-05 DIAGNOSIS — M542 Cervicalgia: Secondary | ICD-10-CM

## 2023-03-05 DIAGNOSIS — K8689 Other specified diseases of pancreas: Secondary | ICD-10-CM

## 2023-03-05 DIAGNOSIS — R1900 Intra-abdominal and pelvic swelling, mass and lump, unspecified site: Secondary | ICD-10-CM

## 2023-03-05 MED ORDER — HYDROCODONE-ACETAMINOPHEN 5-325 MG PO TABS: 1.0000 | ORAL_TABLET | Freq: Four times a day (QID) | ORAL | 0 refills | Status: DC | PRN

## 2023-03-06 ENCOUNTER — Inpatient Hospital Stay (HOSPITAL_BASED_OUTPATIENT_CLINIC_OR_DEPARTMENT_OTHER): Payer: Medicare Other | Admitting: Oncology

## 2023-03-06 ENCOUNTER — Inpatient Hospital Stay: Payer: Medicare Other

## 2023-03-06 VITALS — BP 139/92 | HR 80 | Resp 18

## 2023-03-06 VITALS — BP 113/66 | HR 78 | Temp 97.8°F | Resp 20 | Ht 68.0 in | Wt 155.9 lb

## 2023-03-06 DIAGNOSIS — Z5111 Encounter for antineoplastic chemotherapy: Secondary | ICD-10-CM | POA: Diagnosis not present

## 2023-03-06 DIAGNOSIS — C258 Malignant neoplasm of overlapping sites of pancreas: Secondary | ICD-10-CM | POA: Diagnosis not present

## 2023-03-06 DIAGNOSIS — C252 Malignant neoplasm of tail of pancreas: Secondary | ICD-10-CM

## 2023-03-06 DIAGNOSIS — C7989 Secondary malignant neoplasm of other specified sites: Secondary | ICD-10-CM | POA: Diagnosis not present

## 2023-03-06 DIAGNOSIS — Z452 Encounter for adjustment and management of vascular access device: Secondary | ICD-10-CM | POA: Diagnosis not present

## 2023-03-06 DIAGNOSIS — I10 Essential (primary) hypertension: Secondary | ICD-10-CM | POA: Diagnosis not present

## 2023-03-06 LAB — CMP (CANCER CENTER ONLY)
ALT: 22 U/L (ref 0–44)
AST: 15 U/L (ref 15–41)
Albumin: 3.7 g/dL (ref 3.5–5.0)
Alkaline Phosphatase: 67 U/L (ref 38–126)
Anion gap: 7 (ref 5–15)
BUN: 15 mg/dL (ref 8–23)
CO2: 28 mmol/L (ref 22–32)
Calcium: 9.6 mg/dL (ref 8.9–10.3)
Chloride: 103 mmol/L (ref 98–111)
Creatinine: 0.48 mg/dL — ABNORMAL LOW (ref 0.61–1.24)
GFR, Estimated: >60 mL/min (ref >60)
Glucose, Bld: 121 mg/dL — ABNORMAL HIGH (ref 70–99)
Potassium: 4 mmol/L (ref 3.5–5.1)
Sodium: 138 mmol/L (ref 135–145)
Total Bilirubin: 0.3 mg/dL (ref 0.0–1.2)
Total Protein: 6.8 g/dL (ref 6.5–8.1)

## 2023-03-06 LAB — CBC WITH DIFFERENTIAL (CANCER CENTER ONLY)
Abs Immature Granulocytes: 0.01 10*3/uL (ref 0.00–0.07)
Basophils Absolute: 0 10*3/uL (ref 0.0–0.1)
Basophils Relative: 1 %
Eosinophils Absolute: 0.2 10*3/uL (ref 0.0–0.5)
Eosinophils Relative: 4 %
HCT: 32.8 % — ABNORMAL LOW (ref 39.0–52.0)
Hemoglobin: 10.3 g/dL — ABNORMAL LOW (ref 13.0–17.0)
Immature Granulocytes: 0 %
Lymphocytes Relative: 25 %
Lymphs Abs: 1.4 10*3/uL (ref 0.7–4.0)
MCH: 27.1 pg (ref 26.0–34.0)
MCHC: 31.4 g/dL (ref 30.0–36.0)
MCV: 86.3 fL (ref 80.0–100.0)
Monocytes Absolute: 0.6 10*3/uL (ref 0.1–1.0)
Monocytes Relative: 10 %
Neutro Abs: 3.3 10*3/uL (ref 1.7–7.7)
Neutrophils Relative %: 60 %
Platelet Count: 179 10*3/uL (ref 150–400)
RBC: 3.8 MIL/uL — ABNORMAL LOW (ref 4.22–5.81)
RDW: 14.7 % (ref 11.5–15.5)
WBC Count: 5.5 10*3/uL (ref 4.0–10.5)
nRBC: 0 % (ref 0.0–0.2)

## 2023-03-06 MED ORDER — DEXTROSE 5 % IV SOLN
400.0000 mg/m2 | Freq: Once | INTRAVENOUS | Status: AC
  Administered 2023-03-06: 736 mg via INTRAVENOUS
  Filled 2023-03-06: qty 36.8

## 2023-03-06 MED ORDER — SODIUM CHLORIDE 0.9 % IV SOLN
86.0000 mg | Freq: Once | INTRAVENOUS | Status: AC
  Administered 2023-03-06: 86 mg via INTRAVENOUS
  Filled 2023-03-06: qty 20

## 2023-03-06 MED ORDER — PALONOSETRON HCL INJECTION 0.25 MG/5ML
0.2500 mg | Freq: Once | INTRAVENOUS | Status: AC
  Administered 2023-03-06: 0.25 mg via INTRAVENOUS
  Filled 2023-03-06: qty 5

## 2023-03-06 MED ORDER — OXALIPLATIN CHEMO INJECTION 100 MG/20ML
60.0000 mg/m2 | Freq: Once | INTRAVENOUS | Status: AC
  Administered 2023-03-06: 110 mg via INTRAVENOUS
  Filled 2023-03-06: qty 20

## 2023-03-06 MED ORDER — DEXAMETHASONE SODIUM PHOSPHATE 10 MG/ML IJ SOLN
10.0000 mg | Freq: Once | INTRAMUSCULAR | Status: AC
  Administered 2023-03-06: 10 mg via INTRAVENOUS
  Filled 2023-03-06: qty 1

## 2023-03-06 MED ORDER — FLUOROURACIL CHEMO INJECTION 5 GM/100ML
2400.0000 mg/m2 | INTRAVENOUS | Status: DC
  Administered 2023-03-06: 4400 mg via INTRAVENOUS
  Filled 2023-03-06: qty 88

## 2023-03-06 MED ORDER — ATROPINE SULFATE 1 MG/ML IV SOLN
0.5000 mg | Freq: Once | INTRAVENOUS | Status: DC | PRN
Start: 2023-03-06 — End: 2023-03-06
  Filled 2023-03-06: qty 1

## 2023-03-06 MED ORDER — DEXTROSE 5 % IV SOLN: INTRAVENOUS | Status: DC

## 2023-03-06 NOTE — Progress Notes (Signed)
Patient seen by Dr. Thornton Papas today  Vitals are within treatment parameters:Yes   Labs are within treatment parameters: Yes   Treatment plan has been signed: Yes   Per physician team, Patient is ready for treatment and there are NO modifications to the treatment plan.

## 2023-03-06 NOTE — Patient Instructions (Signed)
[facility]   Discharge Instructions: Thank you for choosing [facility] to provide your oncology and hematology care.   If you have a lab appointment with the Cancer Center, please go directly to the Cancer Center and check in at the registration area.   Wear comfortable clothing and clothing appropriate for easy access to any Portacath or PICC line.   We strive to give you quality time with your provider. You may need to reschedule your appointment if you arrive late (15 or more minutes).  Arriving late affects you and other patients whose appointments are after yours.  Also, if you miss three or more appointments without notifying the office, you may be dismissed from the clinic at the provider's discretion.      For prescription refill requests, have your pharmacy contact our office and allow 72 hours for refills to be completed.    Today you received the following chemotherapy and/or immunotherapy agents Irinotecan Liposome (ONIVYDE), Oxaliplatin (ELOXATIN), Leucovorin & Flourouracil (ADRUCIL).      To help prevent nausea and vomiting after your treatment, we encourage you to take your nausea medication as directed.  BELOW ARE SYMPTOMS THAT SHOULD BE REPORTED IMMEDIATELY: *FEVER GREATER THAN 100.4 F (38 C) OR HIGHER *CHILLS OR SWEATING *NAUSEA AND VOMITING THAT IS NOT CONTROLLED WITH YOUR NAUSEA MEDICATION *UNUSUAL SHORTNESS OF BREATH *UNUSUAL BRUISING OR BLEEDING *URINARY PROBLEMS (pain or burning when urinating, or frequent urination) *BOWEL PROBLEMS (unusual diarrhea, constipation, pain near the anus) TENDERNESS IN MOUTH AND THROAT WITH OR WITHOUT PRESENCE OF ULCERS (sore throat, sores in mouth, or a toothache) UNUSUAL RASH, SWELLING OR PAIN  UNUSUAL VAGINAL DISCHARGE OR ITCHING   Items with * indicate a potential emergency and should be followed up as soon as possible or go to the Emergency Department if any  problems should occur.  Please show the CHEMOTHERAPY ALERT CARD or IMMUNOTHERAPY ALERT CARD at check-in to the Emergency Department and triage nurse.  Should you have questions after your visit or need to cancel or reschedule your appointment, please contact [facility]  Dept: [phone]  and follow the prompts.  Office hours are 8:00 a.m. to 4:30 p.m. Monday - Friday. Please note that voicemails left after 4:00 p.m. may not be returned until the following business day.  We are closed weekends and major holidays. You have access to a nurse at all times for urgent questions. Please call the main number to the clinic Dept: [phone] and follow the prompts.   For any non-urgent questions, you may also contact your provider using MyChart. We now offer e-Visits for anyone 52 and older to request care online for non-urgent symptoms. For details visit [email or web address].   Also download the MyChart app! Go to the app store, search "MyChart", open the app, select [facility], and log in with your MyChart username and password.  Irinotecan Liposome Injection What is this medication? IRINOTECAN LIPOSOME (eye ri noe TEE kan LIP oh som) treats pancreatic cancer. It works by slowing down the growth of cancer cells. This medicine may be used for other purposes; ask your health care provider or pharmacist if you have questions. COMMON BRAND NAME(S): ONIVYDE What should I tell my care team before I take this medication? They need to know if you have any of these conditions: Blockage in your bowels Dehydration Infection Low white blood cell levels  Lung disease An unusual or allergic reaction to irinotecan liposome, irinotecan, other medications, foods, dyes, or preservatives Pregnant or trying to get pregnant Breast-feeding How should I use this medication? This medication is injected into a vein. It is given by your care team in a hospital  or clinic setting. Talk to your care team about the use of this medication in children. Special care may be needed. Overdosage: If you think you have taken too much of this medicine contact a poison control center or emergency room at once. NOTE: This medicine is only for you. Do not share this medicine with others. What if I miss a dose? Keep appointments for follow-up doses. It is important not to miss your dose. Call your care team if you are unable to keep an appointment. What may interact with this medication? Do not take this medication with any of the following: Itraconazole This medication may also interact with the following: Certain antivirals for HIV or AIDS Certain medications for seizures, such as carbamazepine, fosphenytoin, phenytoin, phenobarbital Clarithromycin Gemfibrozil Nefazodone Rifabutin Rifampin Rifapentine St. John's Wort Voriconazole This list may not describe all possible interactions. Give your health care provider a list of all the medicines, herbs, non-prescription drugs, or dietary supplements you use. Also tell them if you smoke, drink alcohol, or use illegal drugs. Some items may interact with your medicine. What should I watch for while using this medication? This medication may make you feel generally unwell. This is not uncommon as chemotherapy can affect healthy cells as well as cancer cells. Report any side effects. Continue your course of treatment even though you feel ill unless your care team tells you to stop. You may need blood work while you are taking this medication. This medication can cause serious side effects and allergic reactions. To reduce your risk, your care team may give you other medications to take before receiving this one. Be sure to follow the directions from your care team. Check with your care team if you get an attack of diarrhea, nausea and vomiting, or if you sweat a lot. The loss of too much body fluid can make it dangerous  for you to take this medication. This medication may cause infertility. Talk to your care team if you are concerned about your fertility. Talk to your care team if you wish to become pregnant or if you think you might be pregnant. This medication can cause serious birth defects if taken during pregnancy or if you get pregnant within 7 months after stopping therapy. A negative pregnancy test is required before starting this medication. A reliable form of contraception is recommended while taking this medication and for 7 months after stopping it. Talk to your care team about reliable forms of contraception. Use a condom during sex and for 4 months after stopping therapy. Tell your care team right away if you think your partner might be pregnant. This medication can cause serious birth defects. Do not breast-feed while taking this medication and for 1 month after stopping therapy. This medication may increase your risk of getting an infection. Call your care team for advice if you get a fever, chills, sore throat, or other symptoms of a cold or flu. Do not treat yourself. Try to avoid being around people who are sick. Avoid taking medications that contain aspirin, acetaminophen, ibuprofen, naproxen, or ketoprofen unless instructed by your care team. These medications may hide a fever. Be careful brushing or flossing your teeth or using a toothpick because you may  get an infection or bleed more easily. If you have any dental work done, tell your dentist you are receiving this medication. What side effects may I notice from receiving this medication? Side effects that you should report to your care team as soon as possible: Allergic reactions or angioedema--skin rash, itching or hives, swelling of the face, eyes, lips, tongue, arms, or legs, trouble swallowing or breathing Dry cough, shortness of breath or trouble breathing Diarrhea Infection--fever, chills, cough, or sore throat Side effects that usually  do not require medical attention (report to your care team if they continue or are bothersome): Fatigue Loss of appetite Nausea Pain, redness, or swelling with sores inside the mouth or throat Vomiting This list may not describe all possible side effects. Call your doctor for medical advice about side effects. You may report side effects to FDA at 1-800-FDA-1088. Where should I keep my medication? This medication is given in a hospital or clinic. It will not be stored at home. NOTE: This sheet is a summary. It may not cover all possible information. If you have questions about this medicine, talk to your doctor, pharmacist, or health care provider.  2024 Elsevier/Gold Standard (2021-04-05 00:00:00)  Oxaliplatin Injection What is this medication? OXALIPLATIN (ox AL i PLA tin) treats colorectal cancer. It works by slowing down the growth of cancer cells. This medicine may be used for other purposes; ask your health care provider or pharmacist if you have questions. COMMON BRAND NAME(S): Eloxatin What should I tell my care team before I take this medication? They need to know if you have any of these conditions: Heart disease History of irregular heartbeat or rhythm Liver disease Low blood cell levels (white cells, red cells, and platelets) Lung or breathing disease, such as asthma Take medications that treat or prevent blood clots Tingling of the fingers, toes, or other nerve disorder An unusual or allergic reaction to oxaliplatin, other medications, foods, dyes, or preservatives If you or your partner are pregnant or trying to get pregnant Breast-feeding How should I use this medication? This medication is injected into a vein. It is given by your care team in a hospital or clinic setting. Talk to your care team about the use of this medication in children. Special care may be needed. Overdosage: If you think you have taken too much of this medicine contact a poison control center or  emergency room at once. NOTE: This medicine is only for you. Do not share this medicine with others. What if I miss a dose? Keep appointments for follow-up doses. It is important not to miss a dose. Call your care team if you are unable to keep an appointment. What may interact with this medication? Do not take this medication with any of the following: Cisapride Dronedarone Pimozide Thioridazine This medication may also interact with the following: Aspirin and aspirin-like medications Certain medications that treat or prevent blood clots, such as warfarin, apixaban, dabigatran, and rivaroxaban Cisplatin Cyclosporine Diuretics Medications for infection, such as acyclovir, adefovir, amphotericin B, bacitracin, cidofovir, foscarnet, ganciclovir, gentamicin, pentamidine, vancomycin NSAIDs, medications for pain and inflammation, such as ibuprofen or naproxen Other medications that cause heart rhythm changes Pamidronate Zoledronic acid This list may not describe all possible interactions. Give your health care provider a list of all the medicines, herbs, non-prescription drugs, or dietary supplements you use. Also tell them if you smoke, drink alcohol, or use illegal drugs. Some items may interact with your medicine. What should I watch for while using this  medication? Your condition will be monitored carefully while you are receiving this medication. You may need blood work while taking this medication. This medication may make you feel generally unwell. This is not uncommon as chemotherapy can affect healthy cells as well as cancer cells. Report any side effects. Continue your course of treatment even though you feel ill unless your care team tells you to stop. This medication may increase your risk of getting an infection. Call your care team for advice if you get a fever, chills, sore throat, or other symptoms of a cold or flu. Do not treat yourself. Try to avoid being around people who are  sick. Avoid taking medications that contain aspirin, acetaminophen, ibuprofen, naproxen, or ketoprofen unless instructed by your care team. These medications may hide a fever. Be careful brushing or flossing your teeth or using a toothpick because you may get an infection or bleed more easily. If you have any dental work done, tell your dentist you are receiving this medication. This medication can make you more sensitive to cold. Do not drink cold drinks or use ice. Cover exposed skin before coming in contact with cold temperatures or cold objects. When out in cold weather wear warm clothing and cover your mouth and nose to warm the air that goes into your lungs. Tell your care team if you get sensitive to the cold. Talk to your care team if you or your partner are pregnant or think either of you might be pregnant. This medication can cause serious birth defects if taken during pregnancy and for 9 months after the last dose. A negative pregnancy test is required before starting this medication. A reliable form of contraception is recommended while taking this medication and for 9 months after the last dose. Talk to your care team about effective forms of contraception. Do not father a child while taking this medication and for 6 months after the last dose. Use a condom while having sex during this time period. Do not breastfeed while taking this medication and for 3 months after the last dose. This medication may cause infertility. Talk to your care team if you are concerned about your fertility. What side effects may I notice from receiving this medication? Side effects that you should report to your care team as soon as possible: Allergic reactions--skin rash, itching, hives, swelling of the face, lips, tongue, or throat Bleeding--bloody or black, tar-like stools, vomiting blood or brown material that looks like coffee grounds, red or dark brown urine, small red or purple spots on skin, unusual bruising  or bleeding Dry cough, shortness of breath or trouble breathing Heart rhythm changes--fast or irregular heartbeat, dizziness, feeling faint or lightheaded, chest pain, trouble breathing Infection--fever, chills, cough, sore throat, wounds that don't heal, pain or trouble when passing urine, general feeling of discomfort or being unwell Liver injury--right upper belly pain, loss of appetite, nausea, light-colored stool, dark yellow or brown urine, yellowing skin or eyes, unusual weakness or fatigue Low red blood cell level--unusual weakness or fatigue, dizziness, headache, trouble breathing Muscle injury--unusual weakness or fatigue, muscle pain, dark yellow or brown urine, decrease in amount of urine Pain, tingling, or numbness in the hands or feet Sudden and severe headache, confusion, change in vision, seizures, which may be signs of posterior reversible encephalopathy syndrome (PRES) Unusual bruising or bleeding Side effects that usually do not require medical attention (report to your care team if they continue or are bothersome): Diarrhea Nausea Pain, redness, or swelling with sores  inside the mouth or throat Unusual weakness or fatigue Vomiting This list may not describe all possible side effects. Call your doctor for medical advice about side effects. You may report side effects to FDA at 1-800-FDA-1088. Where should I keep my medication? This medication is given in a hospital or clinic. It will not be stored at home. NOTE: This sheet is a summary. It may not cover all possible information. If you have questions about this medicine, talk to your doctor, pharmacist, or health care provider.  2024 Elsevier/Gold Standard (2021-11-20 00:00:00)  Leucovorin Injection What is this medication? LEUCOVORIN (loo koe VOR in) prevents side effects from certain medications, such as methotrexate. It works by increasing folate levels. This helps protect healthy cells in your body. It may also be used  to treat anemia caused by low levels of folate. It can also be used with fluorouracil, a type of chemotherapy, to treat colorectal cancer. It works by increasing the effects of fluorouracil in the body. This medicine may be used for other purposes; ask your health care provider or pharmacist if you have questions. What should I tell my care team before I take this medication? They need to know if you have any of these conditions: Anemia from low levels of vitamin B12 in the blood An unusual or allergic reaction to leucovorin, folic acid, other medications, foods, dyes, or preservatives Pregnant or trying to get pregnant Breastfeeding How should I use this medication? This medication is injected into a vein or a muscle. It is given by your care team in a hospital or clinic setting. Talk to your care team about the use of this medication in children. Special care may be needed. Overdosage: If you think you have taken too much of this medicine contact a poison control center or emergency room at once. NOTE: This medicine is only for you. Do not share this medicine with others. What if I miss a dose? Keep appointments for follow-up doses. It is important not to miss your dose. Call your care team if you are unable to keep an appointment. What may interact with this medication? Capecitabine Fluorouracil Phenobarbital Phenytoin Primidone Trimethoprim;sulfamethoxazole This list may not describe all possible interactions. Give your health care provider a list of all the medicines, herbs, non-prescription drugs, or dietary supplements you use. Also tell them if you smoke, drink alcohol, or use illegal drugs. Some items may interact with your medicine. What should I watch for while using this medication? Your condition will be monitored carefully while you are receiving this medication. This medication may increase the side effects of 5-fluorouracil. Tell your care team if you have diarrhea or mouth  sores that do not get better or that get worse. What side effects may I notice from receiving this medication? Side effects that you should report to your care team as soon as possible: Allergic reactions--skin rash, itching, hives, swelling of the face, lips, tongue, or throat This list may not describe all possible side effects. Call your doctor for medical advice about side effects. You may report side effects to FDA at 1-800-FDA-1088. Where should I keep my medication? This medication is given in a hospital or clinic. It will not be stored at home. NOTE: This sheet is a summary. It may not cover all possible information. If you have questions about this medicine, talk to your doctor, pharmacist, or health care provider.  2024 Elsevier/Gold Standard (2021-07-10 00:00:00)  Fluorouracil Injection What is this medication? FLUOROURACIL (flure oh YOOR a  sil) treats some types of cancer. It works by slowing down the growth of cancer cells. This medicine may be used for other purposes; ask your health care provider or pharmacist if you have questions. COMMON BRAND NAME(S): Adrucil What should I tell my care team before I take this medication? They need to know if you have any of these conditions: Blood disorders Dihydropyrimidine dehydrogenase (DPD) deficiency Infection, such as chickenpox, cold sores, herpes Kidney disease Liver disease Poor nutrition Recent or ongoing radiation therapy An unusual or allergic reaction to fluorouracil, other medications, foods, dyes, or preservatives If you or your partner are pregnant or trying to get pregnant Breast-feeding How should I use this medication? This medication is injected into a vein. It is administered by your care team in a hospital or clinic setting. Talk to your care team about the use of this medication in children. Special care may be needed. Overdosage: If you think you have taken too much of this medicine contact a poison control  center or emergency room at once. NOTE: This medicine is only for you. Do not share this medicine with others. What if I miss a dose? Keep appointments for follow-up doses. It is important not to miss your dose. Call your care team if you are unable to keep an appointment. What may interact with this medication? Do not take this medication with any of the following: Live virus vaccines This medication may also interact with the following: Medications that treat or prevent blood clots, such as warfarin, enoxaparin, dalteparin This list may not describe all possible interactions. Give your health care provider a list of all the medicines, herbs, non-prescription drugs, or dietary supplements you use. Also tell them if you smoke, drink alcohol, or use illegal drugs. Some items may interact with your medicine. What should I watch for while using this medication? Your condition will be monitored carefully while you are receiving this medication. This medication may make you feel generally unwell. This is not uncommon as chemotherapy can affect healthy cells as well as cancer cells. Report any side effects. Continue your course of treatment even though you feel ill unless your care team tells you to stop. In some cases, you may be given additional medications to help with side effects. Follow all directions for their use. This medication may increase your risk of getting an infection. Call your care team for advice if you get a fever, chills, sore throat, or other symptoms of a cold or flu. Do not treat yourself. Try to avoid being around people who are sick. This medication may increase your risk to bruise or bleed. Call your care team if you notice any unusual bleeding. Be careful brushing or flossing your teeth or using a toothpick because you may get an infection or bleed more easily. If you have any dental work done, tell your dentist you are receiving this medication. Avoid taking medications that  contain aspirin, acetaminophen, ibuprofen, naproxen, or ketoprofen unless instructed by your care team. These medications may hide a fever. Do not treat diarrhea with over the counter products. Contact your care team if you have diarrhea that lasts more than 2 days or if it is severe and watery. This medication can make you more sensitive to the sun. Keep out of the sun. If you cannot avoid being in the sun, wear protective clothing and sunscreen. Do not use sun lamps, tanning beds, or tanning booths. Talk to your care team if you or your partner wish to  become pregnant or think you might be pregnant. This medication can cause serious birth defects if taken during pregnancy and for 3 months after the last dose. A reliable form of contraception is recommended while taking this medication and for 3 months after the last dose. Talk to your care team about effective forms of contraception. Do not father a child while taking this medication and for 3 months after the last dose. Use a condom while having sex during this time period. Do not breastfeed while taking this medication. This medication may cause infertility. Talk to your care team if you are concerned about your fertility. What side effects may I notice from receiving this medication? Side effects that you should report to your care team as soon as possible: Allergic reactions--skin rash, itching, hives, swelling of the face, lips, tongue, or throat Heart attack--pain or tightness in the chest, shoulders, arms, or jaw, nausea, shortness of breath, cold or clammy skin, feeling faint or lightheaded Heart failure--shortness of breath, swelling of the ankles, feet, or hands, sudden weight gain, unusual weakness or fatigue Heart rhythm changes--fast or irregular heartbeat, dizziness, feeling faint or lightheaded, chest pain, trouble breathing High ammonia level--unusual weakness or fatigue, confusion, loss of appetite, nausea, vomiting,  seizures Infection--fever, chills, cough, sore throat, wounds that don't heal, pain or trouble when passing urine, general feeling of discomfort or being unwell Low red blood cell level--unusual weakness or fatigue, dizziness, headache, trouble breathing Pain, tingling, or numbness in the hands or feet, muscle weakness, change in vision, confusion or trouble speaking, loss of balance or coordination, trouble walking, seizures Redness, swelling, and blistering of the skin over hands and feet Severe or prolonged diarrhea Unusual bruising or bleeding Side effects that usually do not require medical attention (report to your care team if they continue or are bothersome): Dry skin Headache Increased tears Nausea Pain, redness, or swelling with sores inside the mouth or throat Sensitivity to light Vomiting This list may not describe all possible side effects. Call your doctor for medical advice about side effects. You may report side effects to FDA at 1-800-FDA-1088. Where should I keep my medication? This medication is given in a hospital or clinic. It will not be stored at home. NOTE: This sheet is a summary. It may not cover all possible information. If you have questions about this medicine, talk to your doctor, pharmacist, or health care provider.  2024 [employer]/Gold Standard (2021-06-12 00:00:00)  The chemotherapy medication bag should finish at 46 hours, 96 hours, or 7 days. For example, if your pump is scheduled for 46 hours and it was put on at 4:00 p.m., it should finish at 2:00 p.m. the day it is scheduled to come off regardless of your appointment time.     Estimated time to finish at 12:00 p.m. on Saturday 03/08/2023.   If the display on your pump reads "Low Volume" and it is beeping, take the batteries out of the pump and come to the cancer center for it to be taken off.   If the pump alarms go off prior to the pump reading "Low Volume" then call [phone] and someone can  assist you.  If the plunger comes out and the chemotherapy medication is leaking out, please use your home chemo spill kit to clean up the spill. Do NOT use paper towels or other household products.  If you have problems or questions regarding your pump, please call either [phone] (24 hours a day) or the cancer center Monday-Friday 8:00  a.m.- 4:30 p.m. at the clinic number and we will assist you. If you are unable to get assistance, then go to the nearest Emergency Department and ask the staff to contact the IV team for assistance.

## 2023-03-06 NOTE — Progress Notes (Addendum)
  [facility] OFFICE PROGRESS NOTE   Diagnosis: Pancreas cancer  INTERVAL HISTORY:   Ralph Davis completed cycle 1 NALIRIFOX beginning 02/18/2023.  No nausea/vomiting, mouth sores, diarrhea, or neuropathy symptoms.  He reports improvement in abdominal pain.  He continues hydrocodone every 6 hours.  Objective:  Vital signs in last 24 hours:  Blood pressure 113/66, pulse 78, temperature 97.8 F (36.6 C), temperature source Oral, resp. rate 20, height 5\' 8"  (1.727 m), weight 155 lb 14.4 oz (70.7 kg), SpO2 99%.    HEENT: No thrush or ulcers Resp: Lungs clear bilaterally Cardio: Regular rate and rhythm GI: No hepatosplenomegaly, fullness in the lateral left upper abdomen Vascular: No leg edema  Skin: Palms without erythema  Portacath/PICC-without erythema  Lab Results:  Lab Results  Component Value Date   WBC 5.5 03/06/2023   HGB 10.3 (L) 03/06/2023   HCT 32.8 (L) 03/06/2023   MCV 86.3 03/06/2023   PLT 179 03/06/2023   NEUTROABS 3.3 03/06/2023    CMP  Lab Results  Component Value Date   NA 138 03/06/2023   K 4.0 03/06/2023   CL 103 03/06/2023   CO2 28 03/06/2023   GLUCOSE 121 (H) 03/06/2023   BUN 15 03/06/2023   CREATININE 0.48 (L) 03/06/2023   CALCIUM 9.6 03/06/2023   PROT 6.8 03/06/2023   ALBUMIN 3.7 03/06/2023   AST 15 03/06/2023   ALT 22 03/06/2023   ALKPHOS 67 03/06/2023   BILITOT 0.3 03/06/2023   GFRNONAA >60 03/06/2023   GFRAA >60 05/08/2019    Lab Results  Component Value Date   CEA 75.60 (H) 02/06/2023   ZOX096 960 (H) 02/06/2023   Medications: I have reviewed the patient's current medications.   Assessment/Plan: Pancreas cancer  bloating/constipation/anorexia and weight loss/back pain EGD 01/20/2023-diffuse moderate mucosal changes characterized by congestion and erythema in the gastric antrum; a large ulcerated and fungating mass with stigmata of recent bleeding in the duodenal bulb; segmental moderate inflammation  characterized by congestion and erythema found at the pylorus.  There was no evidence of dysplasia or malignancy in the duodenum biopsies.   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 02/06/2023 CEA and CA 19-9 elevated 02/07/2023 biopsy left abdominal soft tissue implant-metastatic moderately differentiated adenocarcinoma, MSS, tumor mutation burden 4, HRD signature negative, [id number] 02/10/2023 CTs neck and chest-no metastatic disease.  Large cystic appearing pancreatic mass and adjacent cystic/necrotic peritoneal lesions. Cycle 1 NALIRIFOX 02/18/2023 Cycle 2 NALIRIFOX 03/06/2023 Hypertension      Disposition: Mr Ralph Davis has metastatic pancreas cancer.  He tolerated the first cycle of chemotherapy well.  He will complete cycle 2 NALIRIFOX today.  He will return for an office visit and chemotherapy in 2 weeks.  He will wean the hydrocodone as tolerated.  Thornton Papas, MD  03/06/2023  8:56 AM

## 2023-03-07 ENCOUNTER — Other Ambulatory Visit: Payer: Self-pay

## 2023-03-08 ENCOUNTER — Inpatient Hospital Stay: Payer: Medicare Other

## 2023-03-08 VITALS — BP 132/87 | HR 85 | Temp 97.9°F | Resp 18

## 2023-03-08 DIAGNOSIS — Z452 Encounter for adjustment and management of vascular access device: Secondary | ICD-10-CM | POA: Diagnosis not present

## 2023-03-08 DIAGNOSIS — C258 Malignant neoplasm of overlapping sites of pancreas: Secondary | ICD-10-CM | POA: Diagnosis not present

## 2023-03-08 DIAGNOSIS — C252 Malignant neoplasm of tail of pancreas: Secondary | ICD-10-CM

## 2023-03-08 DIAGNOSIS — C7989 Secondary malignant neoplasm of other specified sites: Secondary | ICD-10-CM | POA: Diagnosis not present

## 2023-03-08 DIAGNOSIS — Z5111 Encounter for antineoplastic chemotherapy: Secondary | ICD-10-CM | POA: Diagnosis not present

## 2023-03-08 DIAGNOSIS — I10 Essential (primary) hypertension: Secondary | ICD-10-CM | POA: Diagnosis not present

## 2023-03-08 MED ORDER — HEPARIN SOD (PORK) LOCK FLUSH 100 UNIT/ML IV SOLN
500.0000 [IU] | Freq: Once | INTRAVENOUS | Status: AC | PRN
  Administered 2023-03-08: 500 [IU]

## 2023-03-08 MED ORDER — SODIUM CHLORIDE 0.9% FLUSH
10.0000 mL | INTRAVENOUS | Status: DC | PRN
  Administered 2023-03-08: 10 mL

## 2023-03-12 DIAGNOSIS — L03317 Cellulitis of buttock: Secondary | ICD-10-CM | POA: Diagnosis not present

## 2023-03-15 ENCOUNTER — Other Ambulatory Visit: Payer: Self-pay | Admitting: Oncology

## 2023-03-18 ENCOUNTER — Inpatient Hospital Stay: Payer: Medicare Other

## 2023-03-18 ENCOUNTER — Inpatient Hospital Stay: Payer: Medicare Other | Admitting: Nurse Practitioner

## 2023-03-18 ENCOUNTER — Encounter: Payer: Self-pay | Admitting: Nurse Practitioner

## 2023-03-18 VITALS — BP 133/72 | HR 78

## 2023-03-18 VITALS — BP 115/75 | HR 87 | Temp 98.2°F | Resp 18 | Ht 68.0 in | Wt 154.0 lb

## 2023-03-18 DIAGNOSIS — C7989 Secondary malignant neoplasm of other specified sites: Secondary | ICD-10-CM | POA: Diagnosis not present

## 2023-03-18 DIAGNOSIS — C252 Malignant neoplasm of tail of pancreas: Secondary | ICD-10-CM

## 2023-03-18 DIAGNOSIS — Z452 Encounter for adjustment and management of vascular access device: Secondary | ICD-10-CM | POA: Diagnosis not present

## 2023-03-18 DIAGNOSIS — I10 Essential (primary) hypertension: Secondary | ICD-10-CM | POA: Diagnosis not present

## 2023-03-18 DIAGNOSIS — C258 Malignant neoplasm of overlapping sites of pancreas: Secondary | ICD-10-CM | POA: Diagnosis not present

## 2023-03-18 DIAGNOSIS — Z5111 Encounter for antineoplastic chemotherapy: Secondary | ICD-10-CM | POA: Diagnosis not present

## 2023-03-18 LAB — CMP (CANCER CENTER ONLY)
ALT: 17 U/L (ref 0–44)
AST: 12 U/L — ABNORMAL LOW (ref 15–41)
Albumin: 3.5 g/dL (ref 3.5–5.0)
Alkaline Phosphatase: 71 U/L (ref 38–126)
Anion gap: 8 (ref 5–15)
BUN: 10 mg/dL (ref 8–23)
CO2: 27 mmol/L (ref 22–32)
Calcium: 9.4 mg/dL (ref 8.9–10.3)
Chloride: 105 mmol/L (ref 98–111)
Creatinine: 0.52 mg/dL — ABNORMAL LOW (ref 0.61–1.24)
GFR, Estimated: >60 mL/min (ref >60)
Glucose, Bld: 141 mg/dL — ABNORMAL HIGH (ref 70–99)
Potassium: 3.6 mmol/L (ref 3.5–5.1)
Sodium: 140 mmol/L (ref 135–145)
Total Bilirubin: 0.4 mg/dL (ref 0.0–1.2)
Total Protein: 6.9 g/dL (ref 6.5–8.1)

## 2023-03-18 LAB — CBC WITH DIFFERENTIAL (CANCER CENTER ONLY)
Abs Immature Granulocytes: 0.01 10*3/uL (ref 0.00–0.07)
Basophils Absolute: 0 10*3/uL (ref 0.0–0.1)
Basophils Relative: 1 %
Eosinophils Absolute: 0.1 10*3/uL (ref 0.0–0.5)
Eosinophils Relative: 2 %
HCT: 32.8 % — ABNORMAL LOW (ref 39.0–52.0)
Hemoglobin: 10.4 g/dL — ABNORMAL LOW (ref 13.0–17.0)
Immature Granulocytes: 0 %
Lymphocytes Relative: 22 %
Lymphs Abs: 1.2 10*3/uL (ref 0.7–4.0)
MCH: 27.7 pg (ref 26.0–34.0)
MCHC: 31.7 g/dL (ref 30.0–36.0)
MCV: 87.5 fL (ref 80.0–100.0)
Monocytes Absolute: 0.4 10*3/uL (ref 0.1–1.0)
Monocytes Relative: 8 %
Neutro Abs: 3.9 10*3/uL (ref 1.7–7.7)
Neutrophils Relative %: 67 %
Platelet Count: 156 10*3/uL (ref 150–400)
RBC: 3.75 MIL/uL — ABNORMAL LOW (ref 4.22–5.81)
RDW: 15.9 % — ABNORMAL HIGH (ref 11.5–15.5)
WBC Count: 5.7 10*3/uL (ref 4.0–10.5)
nRBC: 0 % (ref 0.0–0.2)

## 2023-03-18 MED ORDER — LEUCOVORIN CALCIUM INJECTION 350 MG
400.0000 mg/m2 | Freq: Once | INTRAVENOUS | Status: AC
  Administered 2023-03-18: 736 mg via INTRAVENOUS
  Filled 2023-03-18: qty 36.8

## 2023-03-18 MED ORDER — DEXTROSE 5 % IV SOLN: INTRAVENOUS | Status: DC

## 2023-03-18 MED ORDER — DEXAMETHASONE SODIUM PHOSPHATE 10 MG/ML IJ SOLN
10.0000 mg | Freq: Once | INTRAMUSCULAR | Status: AC
  Administered 2023-03-18: 10 mg via INTRAVENOUS
  Filled 2023-03-18: qty 1

## 2023-03-18 MED ORDER — PALONOSETRON HCL INJECTION 0.25 MG/5ML
0.2500 mg | Freq: Once | INTRAVENOUS | Status: AC
Start: 2023-03-18 — End: 2023-03-18
  Administered 2023-03-18: 0.25 mg via INTRAVENOUS
  Filled 2023-03-18: qty 5

## 2023-03-18 MED ORDER — ATROPINE SULFATE 1 MG/ML IV SOLN
0.5000 mg | Freq: Once | INTRAVENOUS | Status: DC | PRN
  Filled 2023-03-18: qty 1

## 2023-03-18 MED ORDER — SODIUM CHLORIDE 0.9 % IV SOLN
86.0000 mg | Freq: Once | INTRAVENOUS | Status: AC
  Administered 2023-03-18: 86 mg via INTRAVENOUS
  Filled 2023-03-18: qty 20

## 2023-03-18 MED ORDER — OXALIPLATIN CHEMO INJECTION 100 MG/20ML
60.0000 mg/m2 | Freq: Once | INTRAVENOUS | Status: AC
  Administered 2023-03-18: 110 mg via INTRAVENOUS
  Filled 2023-03-18: qty 20

## 2023-03-18 MED ORDER — FLUOROURACIL CHEMO INJECTION 5 GM/100ML
2400.0000 mg/m2 | INTRAVENOUS | Status: DC
  Administered 2023-03-18: 4400 mg via INTRAVENOUS
  Filled 2023-03-18: qty 88

## 2023-03-18 NOTE — Progress Notes (Signed)
  [facility] OFFICE PROGRESS NOTE   Diagnosis: Pancreas cancer  INTERVAL HISTORY:   Mr. Yamada returns as scheduled.  He completed cycle 2 NALIRIFOX 03/06/2023.  He denies nausea/vomiting.  No mouth sores.  No diarrhea.  He did not experience cold sensitivity.  He reports no abdominal pain.  He is taking hydrocodone 2 tablets every 8 hours.    Objective:  Vital signs in last 24 hours:  Blood pressure 115/75, pulse 87, temperature 98.2 F (36.8 C), temperature source Temporal, resp. rate 18, height 5\' 8"  (1.727 m), weight 154 lb (69.9 kg), SpO2 98%.    HEENT: No thrush or ulcers.  Small lesion left buccal mucosa. Resp: Lungs clear bilaterally. Cardio: Regular rate and rhythm. GI: Abdomen soft and nontender.  No hepatosplenomegaly.  No mass. Vascular: No leg edema. Skin: Palms without erythema. Port-A-Cath without erythema.  Lab Results:  Lab Results  Component Value Date   WBC 5.7 03/18/2023   HGB 10.4 (L) 03/18/2023   HCT 32.8 (L) 03/18/2023   MCV 87.5 03/18/2023   PLT 156 03/18/2023   NEUTROABS 3.9 03/18/2023    Imaging:  No results found.  Medications: I have reviewed the patient's current medications.  Assessment/Plan: Pancreas cancer  bloating/constipation/anorexia and weight loss/back pain EGD 01/20/2023-diffuse moderate mucosal changes characterized by congestion and erythema in the gastric antrum; a large ulcerated and fungating mass with stigmata of recent bleeding in the duodenal bulb; segmental moderate inflammation characterized by congestion and erythema found at the pylorus.  There was no evidence of dysplasia or malignancy in the duodenum biopsies.   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 02/06/2023 CEA and CA 19-9 elevated 02/07/2023 biopsy left abdominal soft tissue implant-metastatic moderately differentiated adenocarcinoma, MSS, tumor mutation burden 4, HRD signature negative, [id number] 02/10/2023 CTs neck and chest-no metastatic disease.  Large cystic appearing pancreatic mass and adjacent cystic/necrotic peritoneal lesions. Cycle 1 NALIRIFOX 02/18/2023 Cycle 2 NALIRIFOX 03/06/2023 Cycle 3 NALIRIFOX 03/18/2023 Hypertension    Disposition: Mr. Fukuda appears stable.  He has completed 2 cycles of NALIRIFOX.  He is tolerating treatment well.  He is no longer having abdominal pain.  Plan to proceed with cycle 3 today as scheduled.  CBC and chemistry panel reviewed.  Labs adequate to proceed as above.  He will decrease hydrocodone use as tolerated.  He will return for follow-up and treatment in 2 weeks.  We reviewed the overall plan for restaging CTs after he has completed 5 cycles of chemotherapy.    [name] ANP/GNP-BC   03/18/2023  9:04 AM

## 2023-03-18 NOTE — Progress Notes (Signed)
Patient seen by [name] NP today  Vitals are within treatment parameters:Yes   Labs are within treatment parameters: Yes   Treatment plan has been signed: Yes   Per physician team, Patient is ready for treatment and there are NO modifications to the treatment plan.

## 2023-03-18 NOTE — Patient Instructions (Addendum)
[facility]   Discharge Instructions: The chemotherapy medication bag should finish at 46 hours, 96 hours, or 7 days. For example, if your pump is scheduled for 46 hours and it was put on at 4:00 p.m., it should finish at 2:00 p.m. the day it is scheduled to come off regardless of your appointment time.     Estimated time to finish at 12:30 THURSDAY, March 20, 2023 .   If the display on your pump reads "Low Volume" and it is beeping, take the batteries out of the pump and come to the cancer center for it to be taken off.   If the pump alarms go off prior to the pump reading "Low Volume" then call [phone] and someone can assist you.  If the plunger comes out and the chemotherapy medication is leaking out, please use your home chemo spill kit to clean up the spill. Do NOT use paper towels or other household products.  If you have problems or questions regarding your pump, please call either [phone] (24 hours a day) or the cancer center Monday-Friday 8:00 a.m.- 4:30 p.m. at the clinic number and we will assist you. If you are unable to get assistance, then go to the nearest Emergency Department and ask the staff to contact the IV team for assistance.   Thank you for choosing [facility] to provide your oncology and hematology care.   If you have a lab appointment with the Cancer Center, please go directly to the Cancer Center and check in at the registration area.   Wear comfortable clothing and clothing appropriate for easy access to any Portacath or PICC line.   We strive to give you quality time with your provider. You may need to reschedule your appointment if you arrive late (15 or more minutes).  Arriving late affects you and other patients whose appointments are after yours.  Also, if you miss three or more appointments without notifying the office, you may be dismissed from the clinic at the provider's  discretion.      For prescription refill requests, have your pharmacy contact our office and allow 72 hours for refills to be completed.    Today you received the following chemotherapy and/or immunotherapy agents IRINOTECAN LIPOSOMAL/OXALIPLATIN/LEUCOVORIN/FLUOROURACIL      To help prevent nausea and vomiting after your treatment, we encourage you to take your nausea medication as directed.  BELOW ARE SYMPTOMS THAT SHOULD BE REPORTED IMMEDIATELY: *FEVER GREATER THAN 100.4 F (38 C) OR HIGHER *CHILLS OR SWEATING *NAUSEA AND VOMITING THAT IS NOT CONTROLLED WITH YOUR NAUSEA MEDICATION *UNUSUAL SHORTNESS OF BREATH *UNUSUAL BRUISING OR BLEEDING *URINARY PROBLEMS (pain or burning when urinating, or frequent urination) *BOWEL PROBLEMS (unusual diarrhea, constipation, pain near the anus) TENDERNESS IN MOUTH AND THROAT WITH OR WITHOUT PRESENCE OF ULCERS (sore throat, sores in mouth, or a toothache) UNUSUAL RASH, SWELLING OR PAIN  UNUSUAL VAGINAL DISCHARGE OR ITCHING   Items with * indicate a potential emergency and should be followed up as soon as possible or go to the Emergency Department if any problems should occur.  Please show the CHEMOTHERAPY ALERT CARD or IMMUNOTHERAPY ALERT CARD at check-in to the Emergency Department and triage nurse.  Should you have questions after your visit or need to cancel or reschedule your appointment, please contact [facility]  Dept: [phone]  and follow the prompts.  Office hours are 8:00 a.m. to 4:30 p.m. Monday - Friday. Please note that voicemails left after 4:00 p.m. may not be returned until the following business day.  We are closed weekends and major holidays. You have access to a nurse at all times for urgent questions. Please call the main number to the clinic Dept: [phone] and follow the prompts.   For any non-urgent questions, you may also contact your provider using MyChart. We now  offer e-Visits for anyone 20 and older to request care online for non-urgent symptoms. For details visit [email or web address].   Also download the MyChart app! Go to the app store, search "MyChart", open the app, select [place], and log in with your MyChart username and password.  Irinotecan Liposome Injection What is this medication? IRINOTECAN LIPOSOME (eye ri noe TEE kan LIP oh som) treats pancreatic cancer. It works by slowing down the growth of cancer cells. This medicine may be used for other purposes; ask your health care provider or pharmacist if you have questions. COMMON BRAND NAME(S): ONIVYDE What should I tell my care team before I take this medication? They need to know if you have any of these conditions: Blockage in your bowels Dehydration Infection Low white blood cell levels Lung disease An unusual or allergic reaction to irinotecan liposome, irinotecan, other medications, foods, dyes, or preservatives Pregnant or trying to get pregnant Breast-feeding How should I use this medication? This medication is injected into a vein. It is given by your care team in a hospital or clinic setting. Talk to your care team about the use of this medication in children. Special care may be needed. Overdosage: If you think you have taken too much of this medicine contact a poison control center or emergency room at once. NOTE: This medicine is only for you. Do not share this medicine with others. What if I miss a dose? Keep appointments for follow-up doses. It is important not to miss your dose. Call your care team if you are unable to keep an appointment. What may interact with this medication? Do not take this medication with any of the following: Itraconazole This medication may also interact with the following: Certain antivirals for HIV or AIDS Certain medications for seizures, such as carbamazepine, fosphenytoin, phenytoin,  phenobarbital Clarithromycin Gemfibrozil Nefazodone Rifabutin Rifampin Rifapentine St. John's Wort Voriconazole This list may not describe all possible interactions. Give your health care provider a list of all the medicines, herbs, non-prescription drugs, or dietary supplements you use. Also tell them if you smoke, drink alcohol, or use illegal drugs. Some items may interact with your medicine. What should I watch for while using this medication? This medication may make you feel generally unwell. This is not uncommon as chemotherapy can affect healthy cells as well as cancer cells. Report any side effects. Continue your course of treatment even though you feel ill unless your care team tells you to stop. You may need blood work while you are taking this medication. This medication can cause serious side effects and allergic reactions. To reduce your risk, your care team may give you other medications to take before receiving this one. Be sure to follow the directions from your care team. Check with your care team if you get an attack of diarrhea, nausea and vomiting, or if you sweat a lot. The loss of too much body fluid can make it dangerous for you to take this medication. This medication may cause infertility. Talk to your care team if you  are concerned about your fertility. Talk to your care team if you wish to become pregnant or if you think you might be pregnant. This medication can cause serious birth defects if taken during pregnancy or if you get pregnant within 7 months after stopping therapy. A negative pregnancy test is required before starting this medication. A reliable form of contraception is recommended while taking this medication and for 7 months after stopping it. Talk to your care team about reliable forms of contraception. Use a condom during sex and for 4 months after stopping therapy. Tell your care team right away if you think your partner might be pregnant. This  medication can cause serious birth defects. Do not breast-feed while taking this medication and for 1 month after stopping therapy. This medication may increase your risk of getting an infection. Call your care team for advice if you get a fever, chills, sore throat, or other symptoms of a cold or flu. Do not treat yourself. Try to avoid being around people who are sick. Avoid taking medications that contain aspirin, acetaminophen, ibuprofen, naproxen, or ketoprofen unless instructed by your care team. These medications may hide a fever. Be careful brushing or flossing your teeth or using a toothpick because you may get an infection or bleed more easily. If you have any dental work done, tell your dentist you are receiving this medication. What side effects may I notice from receiving this medication? Side effects that you should report to your care team as soon as possible: Allergic reactions or angioedema--skin rash, itching or hives, swelling of the face, eyes, lips, tongue, arms, or legs, trouble swallowing or breathing Dry cough, shortness of breath or trouble breathing Diarrhea Infection--fever, chills, cough, or sore throat Side effects that usually do not require medical attention (report to your care team if they continue or are bothersome): Fatigue Loss of appetite Nausea Pain, redness, or swelling with sores inside the mouth or throat Vomiting This list may not describe all possible side effects. Call your doctor for medical advice about side effects. You may report side effects to FDA at 1-800-FDA-1088. Where should I keep my medication? This medication is given in a hospital or clinic. It will not be stored at home. NOTE: This sheet is a summary. It may not cover all possible information. If you have questions about this medicine, talk to your doctor, pharmacist, or health care provider.  2024 Elsevier/Gold Standard (2021-04-05 00:00:00) Leucovorin Injection What is this  medication? LEUCOVORIN (loo koe VOR in) prevents side effects from certain medications, such as methotrexate. It works by increasing folate levels. This helps protect healthy cells in your body. It may also be used to treat anemia caused by low levels of folate. It can also be used with fluorouracil, a type of chemotherapy, to treat colorectal cancer. It works by increasing the effects of fluorouracil in the body. This medicine may be used for other purposes; ask your health care provider or pharmacist if you have questions. What should I tell my care team before I take this medication? They need to know if you have any of these conditions: Anemia from low levels of vitamin B12 in the blood An unusual or allergic reaction to leucovorin, folic acid, other medications, foods, dyes, or preservatives Pregnant or trying to get pregnant Breastfeeding How should I use this medication? This medication is injected into a vein or a muscle. It is given by your care team in a hospital or clinic setting. Talk to your care  team about the use of this medication in children. Special care may be needed. Overdosage: If you think you have taken too much of this medicine contact a poison control center or emergency room at once. NOTE: This medicine is only for you. Do not share this medicine with others. What if I miss a dose? Keep appointments for follow-up doses. It is important not to miss your dose. Call your care team if you are unable to keep an appointment. What may interact with this medication? Capecitabine Fluorouracil Phenobarbital Phenytoin Primidone Trimethoprim;sulfamethoxazole This list may not describe all possible interactions. Give your health care provider a list of all the medicines, herbs, non-prescription drugs, or dietary supplements you use. Also tell them if you smoke, drink alcohol, or use illegal drugs. Some items may interact with your medicine. What should I watch for while using  this medication? Your condition will be monitored carefully while you are receiving this medication. This medication may increase the side effects of 5-fluorouracil. Tell your care team if you have diarrhea or mouth sores that do not get better or that get worse. What side effects may I notice from receiving this medication? Side effects that you should report to your care team as soon as possible: Allergic reactions--skin rash, itching, hives, swelling of the face, lips, tongue, or throat This list may not describe all possible side effects. Call your doctor for medical advice about side effects. You may report side effects to FDA at 1-800-FDA-1088. Where should I keep my medication? This medication is given in a hospital or clinic. It will not be stored at home. NOTE: This sheet is a summary. It may not cover all possible information. If you have questions about this medicine, talk to your doctor, pharmacist, or health care provider.  2024 Elsevier/Gold Standard (2021-07-10 00:00:00)Oxaliplatin Injection What is this medication? OXALIPLATIN (ox AL i PLA tin) treats colorectal cancer. It works by slowing down the growth of cancer cells. This medicine may be used for other purposes; ask your health care provider or pharmacist if you have questions. COMMON BRAND NAME(S): Eloxatin What should I tell my care team before I take this medication? They need to know if you have any of these conditions: Heart disease History of irregular heartbeat or rhythm Liver disease Low blood cell levels (white cells, red cells, and platelets) Lung or breathing disease, such as asthma Take medications that treat or prevent blood clots Tingling of the fingers, toes, or other nerve disorder An unusual or allergic reaction to oxaliplatin, other medications, foods, dyes, or preservatives If you or your partner are pregnant or trying to get pregnant Breast-feeding How should I use this medication? This medication  is injected into a vein. It is given by your care team in a hospital or clinic setting. Talk to your care team about the use of this medication in children. Special care may be needed. Overdosage: If you think you have taken too much of this medicine contact a poison control center or emergency room at once. NOTE: This medicine is only for you. Do not share this medicine with others. What if I miss a dose? Keep appointments for follow-up doses. It is important not to miss a dose. Call your care team if you are unable to keep an appointment. What may interact with this medication? Do not take this medication with any of the following: Cisapride Dronedarone Pimozide Thioridazine This medication may also interact with the following: Aspirin and aspirin-like medications Certain medications that treat or  prevent blood clots, such as warfarin, apixaban, dabigatran, and rivaroxaban Cisplatin Cyclosporine Diuretics Medications for infection, such as acyclovir, adefovir, amphotericin B, bacitracin, cidofovir, foscarnet, ganciclovir, gentamicin, pentamidine, vancomycin NSAIDs, medications for pain and inflammation, such as ibuprofen or naproxen Other medications that cause heart rhythm changes Pamidronate Zoledronic acid This list may not describe all possible interactions. Give your health care provider a list of all the medicines, herbs, non-prescription drugs, or dietary supplements you use. Also tell them if you smoke, drink alcohol, or use illegal drugs. Some items may interact with your medicine. What should I watch for while using this medication? Your condition will be monitored carefully while you are receiving this medication. You may need blood work while taking this medication. This medication may make you feel generally unwell. This is not uncommon as chemotherapy can affect healthy cells as well as cancer cells. Report any side effects. Continue your course of treatment even though you  feel ill unless your care team tells you to stop. This medication may increase your risk of getting an infection. Call your care team for advice if you get a fever, chills, sore throat, or other symptoms of a cold or flu. Do not treat yourself. Try to avoid being around people who are sick. Avoid taking medications that contain aspirin, acetaminophen, ibuprofen, naproxen, or ketoprofen unless instructed by your care team. These medications may hide a fever. Be careful brushing or flossing your teeth or using a toothpick because you may get an infection or bleed more easily. If you have any dental work done, tell your dentist you are receiving this medication. This medication can make you more sensitive to cold. Do not drink cold drinks or use ice. Cover exposed skin before coming in contact with cold temperatures or cold objects. When out in cold weather wear warm clothing and cover your mouth and nose to warm the air that goes into your lungs. Tell your care team if you get sensitive to the cold. Talk to your care team if you or your partner are pregnant or think either of you might be pregnant. This medication can cause serious birth defects if taken during pregnancy and for 9 months after the last dose. A negative pregnancy test is required before starting this medication. A reliable form of contraception is recommended while taking this medication and for 9 months after the last dose. Talk to your care team about effective forms of contraception. Do not father a child while taking this medication and for 6 months after the last dose. Use a condom while having sex during this time period. Do not breastfeed while taking this medication and for 3 months after the last dose. This medication may cause infertility. Talk to your care team if you are concerned about your fertility. What side effects may I notice from receiving this medication? Side effects that you should report to your care team as soon as  possible: Allergic reactions--skin rash, itching, hives, swelling of the face, lips, tongue, or throat Bleeding--bloody or black, tar-like stools, vomiting blood or brown material that looks like coffee grounds, red or dark brown urine, small red or purple spots on skin, unusual bruising or bleeding Dry cough, shortness of breath or trouble breathing Heart rhythm changes--fast or irregular heartbeat, dizziness, feeling faint or lightheaded, chest pain, trouble breathing Infection--fever, chills, cough, sore throat, wounds that don't heal, pain or trouble when passing urine, general feeling of discomfort or being unwell Liver injury--right upper belly pain, loss of appetite, nausea,  light-colored stool, dark yellow or brown urine, yellowing skin or eyes, unusual weakness or fatigue Low red blood cell level--unusual weakness or fatigue, dizziness, headache, trouble breathing Muscle injury--unusual weakness or fatigue, muscle pain, dark yellow or brown urine, decrease in amount of urine Pain, tingling, or numbness in the hands or feet Sudden and severe headache, confusion, change in vision, seizures, which may be signs of posterior reversible encephalopathy syndrome (PRES) Unusual bruising or bleeding Side effects that usually do not require medical attention (report to your care team if they continue or are bothersome): Diarrhea Nausea Pain, redness, or swelling with sores inside the mouth or throat Unusual weakness or fatigue Vomiting This list may not describe all possible side effects. Call your doctor for medical advice about side effects. You may report side effects to FDA at 1-800-FDA-1088. Where should I keep my medication? This medication is given in a hospital or clinic. It will not be stored at home. NOTE: This sheet is a summary. It may not cover all possible information. If you have questions about this medicine, talk to your doctor, pharmacist, or health care provider.  2024  Elsevier/Gold Standard (2023-01-17 00:00:00)  Fluorouracil Injection What is this medication? FLUOROURACIL (flure oh YOOR a sil) treats some types of cancer. It works by slowing down the growth of cancer cells. This medicine may be used for other purposes; ask your health care provider or pharmacist if you have questions. COMMON BRAND NAME(S): Adrucil What should I tell my care team before I take this medication? They need to know if you have any of these conditions: Blood disorders Dihydropyrimidine dehydrogenase (DPD) deficiency Infection, such as chickenpox, cold sores, herpes Kidney disease Liver disease Poor nutrition Recent or ongoing radiation therapy An unusual or allergic reaction to fluorouracil, other medications, foods, dyes, or preservatives If you or your partner are pregnant or trying to get pregnant Breast-feeding How should I use this medication? This medication is injected into a vein. It is administered by your care team in a hospital or clinic setting. Talk to your care team about the use of this medication in children. Special care may be needed. Overdosage: If you think you have taken too much of this medicine contact a poison control center or emergency room at once. NOTE: This medicine is only for you. Do not share this medicine with others. What if I miss a dose? Keep appointments for follow-up doses. It is important not to miss your dose. Call your care team if you are unable to keep an appointment. What may interact with this medication? Do not take this medication with any of the following: Live virus vaccines This medication may also interact with the following: Medications that treat or prevent blood clots, such as warfarin, enoxaparin, dalteparin This list may not describe all possible interactions. Give your health care provider a list of all the medicines, herbs, non-prescription drugs, or dietary supplements you use. Also tell them if you smoke, drink  alcohol, or use illegal drugs. Some items may interact with your medicine. What should I watch for while using this medication? Your condition will be monitored carefully while you are receiving this medication. This medication may make you feel generally unwell. This is not uncommon as chemotherapy can affect healthy cells as well as cancer cells. Report any side effects. Continue your course of treatment even though you feel ill unless your care team tells you to stop. In some cases, you may be given additional medications to help with side effects.  Follow all directions for their use. This medication may increase your risk of getting an infection. Call your care team for advice if you get a fever, chills, sore throat, or other symptoms of a cold or flu. Do not treat yourself. Try to avoid being around people who are sick. This medication may increase your risk to bruise or bleed. Call your care team if you notice any unusual bleeding. Be careful brushing or flossing your teeth or using a toothpick because you may get an infection or bleed more easily. If you have any dental work done, tell your dentist you are receiving this medication. Avoid taking medications that contain aspirin, acetaminophen, ibuprofen, naproxen, or ketoprofen unless instructed by your care team. These medications may hide a fever. Do not treat diarrhea with over the counter products. Contact your care team if you have diarrhea that lasts more than 2 days or if it is severe and watery. This medication can make you more sensitive to the sun. Keep out of the sun. If you cannot avoid being in the sun, wear protective clothing and sunscreen. Do not use sun lamps, tanning beds, or tanning booths. Talk to your care team if you or your partner wish to become pregnant or think you might be pregnant. This medication can cause serious birth defects if taken during pregnancy and for 3 months after the last dose. A reliable form of  contraception is recommended while taking this medication and for 3 months after the last dose. Talk to your care team about effective forms of contraception. Do not father a child while taking this medication and for 3 months after the last dose. Use a condom while having sex during this time period. Do not breastfeed while taking this medication. This medication may cause infertility. Talk to your care team if you are concerned about your fertility. What side effects may I notice from receiving this medication? Side effects that you should report to your care team as soon as possible: Allergic reactions--skin rash, itching, hives, swelling of the face, lips, tongue, or throat Heart attack--pain or tightness in the chest, shoulders, arms, or jaw, nausea, shortness of breath, cold or clammy skin, feeling faint or lightheaded Heart failure--shortness of breath, swelling of the ankles, feet, or hands, sudden weight gain, unusual weakness or fatigue Heart rhythm changes--fast or irregular heartbeat, dizziness, feeling faint or lightheaded, chest pain, trouble breathing High ammonia level--unusual weakness or fatigue, confusion, loss of appetite, nausea, vomiting, seizures Infection--fever, chills, cough, sore throat, wounds that don't heal, pain or trouble when passing urine, general feeling of discomfort or being unwell Low red blood cell level--unusual weakness or fatigue, dizziness, headache, trouble breathing Pain, tingling, or numbness in the hands or feet, muscle weakness, change in vision, confusion or trouble speaking, loss of balance or coordination, trouble walking, seizures Redness, swelling, and blistering of the skin over hands and feet Severe or prolonged diarrhea Unusual bruising or bleeding Side effects that usually do not require medical attention (report to your care team if they continue or are bothersome): Dry skin Headache Increased tears Nausea Pain, redness, or swelling with  sores inside the mouth or throat Sensitivity to light Vomiting This list may not describe all possible side effects. Call your doctor for medical advice about side effects. You may report side effects to FDA at 1-800-FDA-1088. Where should I keep my medication? This medication is given in a hospital or clinic. It will not be stored at home. NOTE: This sheet is a summary.  It may not cover all possible information. If you have questions about this medicine, talk to your doctor, pharmacist, or health care provider.  2024 [employer] Standard (2021-06-12 00:00:00)

## 2023-03-19 ENCOUNTER — Inpatient Hospital Stay: Payer: Medicare Other | Admitting: Nutrition

## 2023-03-19 ENCOUNTER — Telehealth: Payer: Self-pay | Admitting: *Deleted

## 2023-03-19 ENCOUNTER — Other Ambulatory Visit: Payer: Self-pay

## 2023-03-19 ENCOUNTER — Other Ambulatory Visit: Payer: Self-pay | Admitting: Nurse Practitioner

## 2023-03-19 DIAGNOSIS — K8689 Other specified diseases of pancreas: Secondary | ICD-10-CM

## 2023-03-19 DIAGNOSIS — M542 Cervicalgia: Secondary | ICD-10-CM

## 2023-03-19 DIAGNOSIS — R1900 Intra-abdominal and pelvic swelling, mass and lump, unspecified site: Secondary | ICD-10-CM

## 2023-03-19 LAB — CANCER ANTIGEN 19-9: CA 19-9: 2344 U/mL — ABNORMAL HIGH (ref 0–35)

## 2023-03-19 MED ORDER — HYDROCODONE-ACETAMINOPHEN 5-325 MG PO TABS: 1.0000 | ORAL_TABLET | Freq: Four times a day (QID) | ORAL | 0 refills | Status: DC | PRN

## 2023-03-19 NOTE — Progress Notes (Signed)
Brief nutrition follow up completed with patient receiving NALIRIFOX and followed by Dr. Truett Perna. He has completed 3 cycles of treatment.  Weight documented as 154 pounds Jan 28 which is decreased from 160 pounds 6 oz on Dec 31 but stable from 155 pounds 14.4 oz on Jan 16.  Labs noted: Glucose 141 and Creatinine 0.52  Patient is pleased he has maintained weight the past several weeks but was hoping to have gained a bit. He feels well overall and denies nausea, vomiting, constipation and diarrhea. He does not have mouth sores but NP saw a small lesion on left buccal mucosa. Patient is eating "more than ever" and taste has improved. He eats 3 meals every day with 2 snacks. Patient is appreciative of follow up and is agreeable to reaching out with nutrition questions.  Nutrition Diagnosis: Unintended weight loss continues.  Intervention: Provided support for patient to continue strategies for increasing calories and protein. Continue ONS. Continue meals with snacks in between meals.  Monitoring, Evaluation, Goals: Increase calories and protein to minimize wt loss.  Next Visit: To be scheduled as needed.

## 2023-03-19 NOTE — Telephone Encounter (Signed)
Wife called to request refill on his Hydrocodone-apap. In speaking with patient, he had been taking 6/day until office visit on 1/28 and is now taking 3/day and seems to be working at this time.

## 2023-03-20 ENCOUNTER — Inpatient Hospital Stay: Payer: Medicare Other

## 2023-03-20 VITALS — BP 136/74 | HR 78 | Temp 97.9°F | Resp 20

## 2023-03-20 DIAGNOSIS — Z452 Encounter for adjustment and management of vascular access device: Secondary | ICD-10-CM | POA: Diagnosis not present

## 2023-03-20 DIAGNOSIS — C258 Malignant neoplasm of overlapping sites of pancreas: Secondary | ICD-10-CM | POA: Diagnosis not present

## 2023-03-20 DIAGNOSIS — C7989 Secondary malignant neoplasm of other specified sites: Secondary | ICD-10-CM | POA: Diagnosis not present

## 2023-03-20 DIAGNOSIS — C252 Malignant neoplasm of tail of pancreas: Secondary | ICD-10-CM

## 2023-03-20 DIAGNOSIS — Z5111 Encounter for antineoplastic chemotherapy: Secondary | ICD-10-CM | POA: Diagnosis not present

## 2023-03-20 DIAGNOSIS — I10 Essential (primary) hypertension: Secondary | ICD-10-CM | POA: Diagnosis not present

## 2023-03-20 MED ORDER — HEPARIN SOD (PORK) LOCK FLUSH 100 UNIT/ML IV SOLN
500.0000 [IU] | Freq: Once | INTRAVENOUS | Status: AC | PRN
  Administered 2023-03-20: 500 [IU]

## 2023-03-20 MED ORDER — SODIUM CHLORIDE 0.9% FLUSH
10.0000 mL | INTRAVENOUS | Status: DC | PRN
  Administered 2023-03-20: 10 mL

## 2023-03-20 NOTE — Progress Notes (Signed)
Pt. states he had an increase in phlegm and some abdominal cramping at home, but resolved. Left message in comments for RN that he may need Atropine with next treatment.

## 2023-03-24 ENCOUNTER — Telehealth: Payer: Self-pay

## 2023-03-24 NOTE — Telephone Encounter (Signed)
Pt had called after-hours phone line to report increased saliva and nausea and spoke with triage RN. F/U called placed today. Pt reports feeling "much better" after taking an antihistamine on 03/23/2023 and states that he is eating and continuing oral hydration at home. Pt verbalizes understanding to call [facility] with further concerns.

## 2023-03-25 ENCOUNTER — Telehealth: Payer: Self-pay

## 2023-03-25 NOTE — Telephone Encounter (Signed)
 Spoke with pt regarding VM left this AM. Pt states that the secretions in his throat have gotten worse again and that he gargled salt water  this AM. Pt reported vomiting X 1 after gargling with salt water . Pt reports still continuing oral rehydration with oral fluids. He reports decreased intake of food today. Pt reports no nausea/diarrhea/constipation or other s/s. Pt states that his anti-nausea medication does not help much but did take a zofran  pill this AM. He verbalizes that he will take a Claritin around 1300 today. Pt reports that he was just sitting down to eat lunch today of potato salad and macaroni and cheese. This RN encouraged pt to continue to drink fluids and try eating different foods. This RN verbalized that MD Cloretta would be notified and that he would receive a call back pending MD recommendations/changes. Pt verbalizes understanding and agrees with plan of care.

## 2023-03-26 ENCOUNTER — Other Ambulatory Visit: Payer: Self-pay | Admitting: Nurse Practitioner

## 2023-03-26 ENCOUNTER — Encounter: Payer: Self-pay | Admitting: *Deleted

## 2023-03-26 DIAGNOSIS — K117 Disturbances of salivary secretion: Secondary | ICD-10-CM

## 2023-03-26 DIAGNOSIS — C252 Malignant neoplasm of tail of pancreas: Secondary | ICD-10-CM

## 2023-03-26 MED ORDER — SCOPOLAMINE 1 MG/3DAYS TD PT72: 1.0000 | MEDICATED_PATCH | TRANSDERMAL | 12 refills | Status: DC

## 2023-03-26 NOTE — Progress Notes (Signed)
 PATIENT NAVIGATOR PROGRESS NOTE  Name: Ralph Davis Date: 03/26/2023 MRN: [id number]  DOB: 07-19-1945   Reason for visit:  Telephone call  Comments:  Called and spoke with Ron and Deane regarding the hyper secretions that he is experiencing which is effecting eating and drinking. Spoke with Olam Ned NP and recommends Scopolamine  patch to alleviate hyper secretions.  Ron does not have nausea.  We discussed setting alarms on phone to remind him to eat every 3-4 hours throughout the day as well.  He agrees.     Time spent counseling/coordinating care: 30-45 minutes

## 2023-03-29 ENCOUNTER — Other Ambulatory Visit: Payer: Self-pay | Admitting: Oncology

## 2023-04-01 ENCOUNTER — Inpatient Hospital Stay: Payer: Medicare Other

## 2023-04-01 ENCOUNTER — Encounter: Payer: Self-pay | Admitting: Nurse Practitioner

## 2023-04-01 ENCOUNTER — Inpatient Hospital Stay: Payer: Medicare Other | Attending: Nurse Practitioner

## 2023-04-01 ENCOUNTER — Inpatient Hospital Stay: Payer: Medicare Other | Admitting: Nurse Practitioner

## 2023-04-01 VITALS — BP 121/80 | HR 78 | Temp 98.1°F | Resp 18 | Ht 68.0 in | Wt 151.0 lb

## 2023-04-01 VITALS — BP 141/78 | HR 74

## 2023-04-01 DIAGNOSIS — I10 Essential (primary) hypertension: Secondary | ICD-10-CM | POA: Insufficient documentation

## 2023-04-01 DIAGNOSIS — C258 Malignant neoplasm of overlapping sites of pancreas: Secondary | ICD-10-CM | POA: Insufficient documentation

## 2023-04-01 DIAGNOSIS — E876 Hypokalemia: Secondary | ICD-10-CM | POA: Diagnosis not present

## 2023-04-01 DIAGNOSIS — E860 Dehydration: Secondary | ICD-10-CM | POA: Diagnosis not present

## 2023-04-01 DIAGNOSIS — Z452 Encounter for adjustment and management of vascular access device: Secondary | ICD-10-CM | POA: Diagnosis not present

## 2023-04-01 DIAGNOSIS — C252 Malignant neoplasm of tail of pancreas: Secondary | ICD-10-CM

## 2023-04-01 DIAGNOSIS — D696 Thrombocytopenia, unspecified: Secondary | ICD-10-CM | POA: Insufficient documentation

## 2023-04-01 DIAGNOSIS — K117 Disturbances of salivary secretion: Secondary | ICD-10-CM | POA: Insufficient documentation

## 2023-04-01 DIAGNOSIS — Z86718 Personal history of other venous thrombosis and embolism: Secondary | ICD-10-CM | POA: Insufficient documentation

## 2023-04-01 DIAGNOSIS — Z5111 Encounter for antineoplastic chemotherapy: Secondary | ICD-10-CM | POA: Diagnosis not present

## 2023-04-01 LAB — CMP (CANCER CENTER ONLY)
ALT: 13 U/L (ref 0–44)
AST: 12 U/L — ABNORMAL LOW (ref 15–41)
Albumin: 3.4 g/dL — ABNORMAL LOW (ref 3.5–5.0)
Alkaline Phosphatase: 65 U/L (ref 38–126)
Anion gap: 6 (ref 5–15)
BUN: 6 mg/dL — ABNORMAL LOW (ref 8–23)
CO2: 26 mmol/L (ref 22–32)
Calcium: 9 mg/dL (ref 8.9–10.3)
Chloride: 105 mmol/L (ref 98–111)
Creatinine: 0.51 mg/dL — ABNORMAL LOW (ref 0.61–1.24)
GFR, Estimated: >60 mL/min (ref >60)
Glucose, Bld: 125 mg/dL — ABNORMAL HIGH (ref 70–99)
Potassium: 3.2 mmol/L — ABNORMAL LOW (ref 3.5–5.1)
Sodium: 137 mmol/L (ref 135–145)
Total Bilirubin: 0.4 mg/dL (ref 0.0–1.2)
Total Protein: 6.4 g/dL — ABNORMAL LOW (ref 6.5–8.1)

## 2023-04-01 LAB — CBC WITH DIFFERENTIAL (CANCER CENTER ONLY)
Abs Immature Granulocytes: 0.01 10*3/uL (ref 0.00–0.07)
Basophils Absolute: 0 10*3/uL (ref 0.0–0.1)
Basophils Relative: 1 %
Eosinophils Absolute: 0.1 10*3/uL (ref 0.0–0.5)
Eosinophils Relative: 3 %
HCT: 32.6 % — ABNORMAL LOW (ref 39.0–52.0)
Hemoglobin: 10.5 g/dL — ABNORMAL LOW (ref 13.0–17.0)
Immature Granulocytes: 0 %
Lymphocytes Relative: 28 %
Lymphs Abs: 1.5 10*3/uL (ref 0.7–4.0)
MCH: 28.4 pg (ref 26.0–34.0)
MCHC: 32.2 g/dL (ref 30.0–36.0)
MCV: 88.1 fL (ref 80.0–100.0)
Monocytes Absolute: 0.5 10*3/uL (ref 0.1–1.0)
Monocytes Relative: 9 %
Neutro Abs: 3.3 10*3/uL (ref 1.7–7.7)
Neutrophils Relative %: 59 %
Platelet Count: 116 10*3/uL — ABNORMAL LOW (ref 150–400)
RBC: 3.7 MIL/uL — ABNORMAL LOW (ref 4.22–5.81)
RDW: 17.8 % — ABNORMAL HIGH (ref 11.5–15.5)
WBC Count: 5.5 10*3/uL (ref 4.0–10.5)
nRBC: 0 % (ref 0.0–0.2)

## 2023-04-01 MED ORDER — PALONOSETRON HCL INJECTION 0.25 MG/5ML
0.2500 mg | Freq: Once | INTRAVENOUS | Status: AC
  Administered 2023-04-01: 0.25 mg via INTRAVENOUS
  Filled 2023-04-01: qty 5

## 2023-04-01 MED ORDER — DEXAMETHASONE SODIUM PHOSPHATE 10 MG/ML IJ SOLN
10.0000 mg | Freq: Once | INTRAMUSCULAR | Status: AC
  Administered 2023-04-01: 10 mg via INTRAVENOUS
  Filled 2023-04-01: qty 1

## 2023-04-01 MED ORDER — SODIUM CHLORIDE 0.9 % IV SOLN
2400.0000 mg/m2 | INTRAVENOUS | Status: DC
  Administered 2023-04-01: 4400 mg via INTRAVENOUS
  Filled 2023-04-01: qty 88

## 2023-04-01 MED ORDER — ATROPINE SULFATE 1 MG/ML IV SOLN
0.5000 mg | Freq: Once | INTRAVENOUS | Status: AC | PRN
  Administered 2023-04-01: 0.5 mg via INTRAVENOUS
  Filled 2023-04-01: qty 1

## 2023-04-01 MED ORDER — DEXTROSE 5 % IV SOLN: INTRAVENOUS | Status: DC

## 2023-04-01 MED ORDER — POTASSIUM CHLORIDE CRYS ER 10 MEQ PO TBCR: 10.0000 meq | EXTENDED_RELEASE_TABLET | Freq: Two times a day (BID) | ORAL | 1 refills | Status: DC

## 2023-04-01 MED ORDER — SODIUM CHLORIDE 0.9 % IV SOLN
86.0000 mg | Freq: Once | INTRAVENOUS | Status: AC
  Administered 2023-04-01: 86 mg via INTRAVENOUS
  Filled 2023-04-01: qty 20

## 2023-04-01 MED ORDER — LEUCOVORIN CALCIUM INJECTION 350 MG
400.0000 mg/m2 | Freq: Once | INTRAVENOUS | Status: AC
  Administered 2023-04-01: 736 mg via INTRAVENOUS
  Filled 2023-04-01: qty 36.8

## 2023-04-01 MED ORDER — OXALIPLATIN CHEMO INJECTION 100 MG/20ML
60.0000 mg/m2 | Freq: Once | INTRAVENOUS | Status: AC
  Administered 2023-04-01: 110 mg via INTRAVENOUS
  Filled 2023-04-01: qty 20

## 2023-04-01 NOTE — Patient Instructions (Addendum)
[facility] - A DEPT OF [facility]   Discharge Instructions: The chemotherapy medication bag should finish at 46 hours, 96 hours, or 7 days. For example, if your pump is scheduled for 46 hours and it was put on at 4:00 p.m., it should finish at 2:00 p.m. the day it is scheduled to come off regardless of your appointment time.     Estimated time to finish at 12:30 Thursday, April 03, 2023.   If the display on your pump reads "Low Volume" and it is beeping, take the batteries out of the pump and come to the cancer center for it to be taken off.   If the pump alarms go off prior to the pump reading "Low Volume" then call [phone] and someone can assist you.  If the plunger comes out and the chemotherapy medication is leaking out, please use your home chemo spill kit to clean up the spill. Do NOT use paper towels or other household products.  If you have problems or questions regarding your pump, please call either [phone] (24 hours a day) or the cancer center Monday-Friday 8:00 a.m.- 4:30 p.m. at the clinic number and we will assist you. If you are unable to get assistance, then go to the nearest Emergency Department and ask the staff to contact the IV team for assistance.   Thank you for choosing [facility] to provide your oncology and hematology care.   If you have a lab appointment with the Cancer Center, please go directly to the Cancer Center and check in at the registration area.   Wear comfortable clothing and clothing appropriate for easy access to any Portacath or PICC line.   We strive to give you quality time with your provider. You may need to reschedule your appointment if you arrive late (15 or more minutes).  Arriving late affects you and other patients whose appointments are after yours.  Also, if you miss three or more appointments without notifying the office, you may be dismissed from the clinic at the provider's  discretion.      For prescription refill requests, have your pharmacy contact our office and allow 72 hours for refills to be completed.    Today you received the following chemotherapy and/or immunotherapy agents IRINOTECAN LIPOSOME/OXALIPLATIN/LEUCOVORIN/FLUOROURACIL      To help prevent nausea and vomiting after your treatment, we encourage you to take your nausea medication as directed.  BELOW ARE SYMPTOMS THAT SHOULD BE REPORTED IMMEDIATELY: *FEVER GREATER THAN 100.4 F (38 C) OR HIGHER *CHILLS OR SWEATING *NAUSEA AND VOMITING THAT IS NOT CONTROLLED WITH YOUR NAUSEA MEDICATION *UNUSUAL SHORTNESS OF BREATH *UNUSUAL BRUISING OR BLEEDING *URINARY PROBLEMS (pain or burning when urinating, or frequent urination) *BOWEL PROBLEMS (unusual diarrhea, constipation, pain near the anus) TENDERNESS IN MOUTH AND THROAT WITH OR WITHOUT PRESENCE OF ULCERS (sore throat, sores in mouth, or a toothache) UNUSUAL RASH, SWELLING OR PAIN  UNUSUAL VAGINAL DISCHARGE OR ITCHING   Items with * indicate a potential emergency and should be followed up as soon as possible or go to the Emergency Department if any problems should occur.  Please show the CHEMOTHERAPY ALERT CARD or IMMUNOTHERAPY ALERT CARD at check-in to the Emergency Department and triage nurse.  Should you have questions after your visit or need to cancel or reschedule your appointment, please contact [facility]  Dept: [phone]  and follow the prompts.  Office  hours are 8:00 a.m. to 4:30 p.m. Monday - Friday. Please note that voicemails left after 4:00 p.m. may not be returned until the following business day.  We are closed weekends and major holidays. You have access to a nurse at all times for urgent questions. Please call the main number to the clinic Dept: [phone] and follow the prompts.   For any non-urgent questions, you may also contact your provider using MyChart. We now  offer e-Visits for anyone 17 and older to request care online for non-urgent symptoms. For details visit [email or web address].   Also download the MyChart app! Go to the app store, search "MyChart", open the app, select [place], and log in with your MyChart username and password.  Irinotecan Liposome Injection What is this medication? IRINOTECAN LIPOSOME (eye ri noe TEE kan LIP oh som) treats pancreatic cancer. It works by slowing down the growth of cancer cells. This medicine may be used for other purposes; ask your health care provider or pharmacist if you have questions. COMMON BRAND NAME(S): ONIVYDE What should I tell my care team before I take this medication? They need to know if you have any of these conditions: Blockage in your bowels Dehydration Infection Low white blood cell levels Lung disease An unusual or allergic reaction to irinotecan liposome, irinotecan, other medications, foods, dyes, or preservatives Pregnant or trying to get pregnant Breast-feeding How should I use this medication? This medication is injected into a vein. It is given by your care team in a hospital or clinic setting. Talk to your care team about the use of this medication in children. Special care may be needed. Overdosage: If you think you have taken too much of this medicine contact a poison control center or emergency room at once. NOTE: This medicine is only for you. Do not share this medicine with others. What if I miss a dose? Keep appointments for follow-up doses. It is important not to miss your dose. Call your care team if you are unable to keep an appointment. What may interact with this medication? Do not take this medication with any of the following: Itraconazole This medication may also interact with the following: Certain antivirals for HIV or AIDS Certain medications for seizures, such as carbamazepine, fosphenytoin, phenytoin,  phenobarbital Clarithromycin Gemfibrozil Nefazodone Rifabutin Rifampin Rifapentine St. John's Wort Voriconazole This list may not describe all possible interactions. Give your health care provider a list of all the medicines, herbs, non-prescription drugs, or dietary supplements you use. Also tell them if you smoke, drink alcohol, or use illegal drugs. Some items may interact with your medicine. What should I watch for while using this medication? This medication may make you feel generally unwell. This is not uncommon as chemotherapy can affect healthy cells as well as cancer cells. Report any side effects. Continue your course of treatment even though you feel ill unless your care team tells you to stop. You may need blood work while you are taking this medication. This medication can cause serious side effects and allergic reactions. To reduce your risk, your care team may give you other medications to take before receiving this one. Be sure to follow the directions from your care team. Check with your care team if you get an attack of diarrhea, nausea and vomiting, or if you sweat a lot. The loss of too much body fluid can make it dangerous for you to take this medication. This medication may cause infertility. Talk to your care team if you are  concerned about your fertility. Talk to your care team if you wish to become pregnant or if you think you might be pregnant. This medication can cause serious birth defects if taken during pregnancy or if you get pregnant within 7 months after stopping therapy. A negative pregnancy test is required before starting this medication. A reliable form of contraception is recommended while taking this medication and for 7 months after stopping it. Talk to your care team about reliable forms of contraception. Use a condom during sex and for 4 months after stopping therapy. Tell your care team right away if you think your partner might be pregnant. This  medication can cause serious birth defects. Do not breast-feed while taking this medication and for 1 month after stopping therapy. This medication may increase your risk of getting an infection. Call your care team for advice if you get a fever, chills, sore throat, or other symptoms of a cold or flu. Do not treat yourself. Try to avoid being around people who are sick. Avoid taking medications that contain aspirin, acetaminophen, ibuprofen, naproxen, or ketoprofen unless instructed by your care team. These medications may hide a fever. Be careful brushing or flossing your teeth or using a toothpick because you may get an infection or bleed more easily. If you have any dental work done, tell your dentist you are receiving this medication. What side effects may I notice from receiving this medication? Side effects that you should report to your care team as soon as possible: Allergic reactions or angioedema--skin rash, itching or hives, swelling of the face, eyes, lips, tongue, arms, or legs, trouble swallowing or breathing Dry cough, shortness of breath or trouble breathing Diarrhea Infection--fever, chills, cough, or sore throat Side effects that usually do not require medical attention (report to your care team if they continue or are bothersome): Fatigue Loss of appetite Nausea Pain, redness, or swelling with sores inside the mouth or throat Vomiting This list may not describe all possible side effects. Call your doctor for medical advice about side effects. You may report side effects to FDA at 1-800-FDA-1088. Where should I keep my medication? This medication is given in a hospital or clinic. It will not be stored at home. NOTE: This sheet is a summary. It may not cover all possible information. If you have questions about this medicine, talk to your doctor, pharmacist, or health care provider.  2024 Elsevier/Gold Standard (2021-04-05 00:00:00)  Oxaliplatin Injection What is this  medication? OXALIPLATIN (ox AL i PLA tin) treats colorectal cancer. It works by slowing down the growth of cancer cells. This medicine may be used for other purposes; ask your health care provider or pharmacist if you have questions. COMMON BRAND NAME(S): Eloxatin What should I tell my care team before I take this medication? They need to know if you have any of these conditions: Heart disease History of irregular heartbeat or rhythm Liver disease Low blood cell levels (white cells, red cells, and platelets) Lung or breathing disease, such as asthma Take medications that treat or prevent blood clots Tingling of the fingers, toes, or other nerve disorder An unusual or allergic reaction to oxaliplatin, other medications, foods, dyes, or preservatives If you or your partner are pregnant or trying to get pregnant Breast-feeding How should I use this medication? This medication is injected into a vein. It is given by your care team in a hospital or clinic setting. Talk to your care team about the use of this medication in children. Special  care may be needed. Overdosage: If you think you have taken too much of this medicine contact a poison control center or emergency room at once. NOTE: This medicine is only for you. Do not share this medicine with others. What if I miss a dose? Keep appointments for follow-up doses. It is important not to miss a dose. Call your care team if you are unable to keep an appointment. What may interact with this medication? Do not take this medication with any of the following: Cisapride Dronedarone Pimozide Thioridazine This medication may also interact with the following: Aspirin and aspirin-like medications Certain medications that treat or prevent blood clots, such as warfarin, apixaban, dabigatran, and rivaroxaban Cisplatin Cyclosporine Diuretics Medications for infection, such as acyclovir, adefovir, amphotericin B, bacitracin, cidofovir, foscarnet,  ganciclovir, gentamicin, pentamidine, vancomycin NSAIDs, medications for pain and inflammation, such as ibuprofen or naproxen Other medications that cause heart rhythm changes Pamidronate Zoledronic acid This list may not describe all possible interactions. Give your health care provider a list of all the medicines, herbs, non-prescription drugs, or dietary supplements you use. Also tell them if you smoke, drink alcohol, or use illegal drugs. Some items may interact with your medicine. What should I watch for while using this medication? Your condition will be monitored carefully while you are receiving this medication. You may need blood work while taking this medication. This medication may make you feel generally unwell. This is not uncommon as chemotherapy can affect healthy cells as well as cancer cells. Report any side effects. Continue your course of treatment even though you feel ill unless your care team tells you to stop. This medication may increase your risk of getting an infection. Call your care team for advice if you get a fever, chills, sore throat, or other symptoms of a cold or flu. Do not treat yourself. Try to avoid being around people who are sick. Avoid taking medications that contain aspirin, acetaminophen, ibuprofen, naproxen, or ketoprofen unless instructed by your care team. These medications may hide a fever. Be careful brushing or flossing your teeth or using a toothpick because you may get an infection or bleed more easily. If you have any dental work done, tell your dentist you are receiving this medication. This medication can make you more sensitive to cold. Do not drink cold drinks or use ice. Cover exposed skin before coming in contact with cold temperatures or cold objects. When out in cold weather wear warm clothing and cover your mouth and nose to warm the air that goes into your lungs. Tell your care team if you get sensitive to the cold. Talk to your care team if  you or your partner are pregnant or think either of you might be pregnant. This medication can cause serious birth defects if taken during pregnancy and for 9 months after the last dose. A negative pregnancy test is required before starting this medication. A reliable form of contraception is recommended while taking this medication and for 9 months after the last dose. Talk to your care team about effective forms of contraception. Do not father a child while taking this medication and for 6 months after the last dose. Use a condom while having sex during this time period. Do not breastfeed while taking this medication and for 3 months after the last dose. This medication may cause infertility. Talk to your care team if you are concerned about your fertility. What side effects may I notice from receiving this medication? Side effects that you  should report to your care team as soon as possible: Allergic reactions--skin rash, itching, hives, swelling of the face, lips, tongue, or throat Bleeding--bloody or black, tar-like stools, vomiting blood or brown material that looks like coffee grounds, red or dark brown urine, small red or purple spots on skin, unusual bruising or bleeding Dry cough, shortness of breath or trouble breathing Heart rhythm changes--fast or irregular heartbeat, dizziness, feeling faint or lightheaded, chest pain, trouble breathing Infection--fever, chills, cough, sore throat, wounds that don't heal, pain or trouble when passing urine, general feeling of discomfort or being unwell Liver injury--right upper belly pain, loss of appetite, nausea, light-colored stool, dark yellow or brown urine, yellowing skin or eyes, unusual weakness or fatigue Low red blood cell level--unusual weakness or fatigue, dizziness, headache, trouble breathing Muscle injury--unusual weakness or fatigue, muscle pain, dark yellow or brown urine, decrease in amount of urine Pain, tingling, or numbness in the  hands or feet Sudden and severe headache, confusion, change in vision, seizures, which may be signs of posterior reversible encephalopathy syndrome (PRES) Unusual bruising or bleeding Side effects that usually do not require medical attention (report to your care team if they continue or are bothersome): Diarrhea Nausea Pain, redness, or swelling with sores inside the mouth or throat Unusual weakness or fatigue Vomiting This list may not describe all possible side effects. Call your doctor for medical advice about side effects. You may report side effects to FDA at 1-800-FDA-1088. Where should I keep my medication? This medication is given in a hospital or clinic. It will not be stored at home. NOTE: This sheet is a summary. It may not cover all possible information. If you have questions about this medicine, talk to your doctor, pharmacist, or health care provider.  2024 Elsevier/Gold Standard (2023-01-17 00:00:00)  Leucovorin Injection What is this medication? LEUCOVORIN (loo koe VOR in) prevents side effects from certain medications, such as methotrexate. It works by increasing folate levels. This helps protect healthy cells in your body. It may also be used to treat anemia caused by low levels of folate. It can also be used with fluorouracil, a type of chemotherapy, to treat colorectal cancer. It works by increasing the effects of fluorouracil in the body. This medicine may be used for other purposes; ask your health care provider or pharmacist if you have questions. What should I tell my care team before I take this medication? They need to know if you have any of these conditions: Anemia from low levels of vitamin B12 in the blood An unusual or allergic reaction to leucovorin, folic acid, other medications, foods, dyes, or preservatives Pregnant or trying to get pregnant Breastfeeding How should I use this medication? This medication is injected into a vein or a muscle. It is given  by your care team in a hospital or clinic setting. Talk to your care team about the use of this medication in children. Special care may be needed. Overdosage: If you think you have taken too much of this medicine contact a poison control center or emergency room at once. NOTE: This medicine is only for you. Do not share this medicine with others. What if I miss a dose? Keep appointments for follow-up doses. It is important not to miss your dose. Call your care team if you are unable to keep an appointment. What may interact with this medication? Capecitabine Fluorouracil Phenobarbital Phenytoin Primidone Trimethoprim;sulfamethoxazole This list may not describe all possible interactions. Give your health care provider a list of all  the medicines, herbs, non-prescription drugs, or dietary supplements you use. Also tell them if you smoke, drink alcohol, or use illegal drugs. Some items may interact with your medicine. What should I watch for while using this medication? Your condition will be monitored carefully while you are receiving this medication. This medication may increase the side effects of 5-fluorouracil. Tell your care team if you have diarrhea or mouth sores that do not get better or that get worse. What side effects may I notice from receiving this medication? Side effects that you should report to your care team as soon as possible: Allergic reactions--skin rash, itching, hives, swelling of the face, lips, tongue, or throat This list may not describe all possible side effects. Call your doctor for medical advice about side effects. You may report side effects to FDA at 1-800-FDA-1088. Where should I keep my medication? This medication is given in a hospital or clinic. It will not be stored at home. NOTE: This sheet is a summary. It may not cover all possible information. If you have questions about this medicine, talk to your doctor, pharmacist, or health care provider.  2024  Elsevier/Gold Standard (2021-07-10 00:00:00)  Fluorouracil Injection What is this medication? FLUOROURACIL (flure oh YOOR a sil) treats some types of cancer. It works by slowing down the growth of cancer cells. This medicine may be used for other purposes; ask your health care provider or pharmacist if you have questions. COMMON BRAND NAME(S): Adrucil What should I tell my care team before I take this medication? They need to know if you have any of these conditions: Blood disorders Dihydropyrimidine dehydrogenase (DPD) deficiency Infection, such as chickenpox, cold sores, herpes Kidney disease Liver disease Poor nutrition Recent or ongoing radiation therapy An unusual or allergic reaction to fluorouracil, other medications, foods, dyes, or preservatives If you or your partner are pregnant or trying to get pregnant Breast-feeding How should I use this medication? This medication is injected into a vein. It is administered by your care team in a hospital or clinic setting. Talk to your care team about the use of this medication in children. Special care may be needed. Overdosage: If you think you have taken too much of this medicine contact a poison control center or emergency room at once. NOTE: This medicine is only for you. Do not share this medicine with others. What if I miss a dose? Keep appointments for follow-up doses. It is important not to miss your dose. Call your care team if you are unable to keep an appointment. What may interact with this medication? Do not take this medication with any of the following: Live virus vaccines This medication may also interact with the following: Medications that treat or prevent blood clots, such as warfarin, enoxaparin, dalteparin This list may not describe all possible interactions. Give your health care provider a list of all the medicines, herbs, non-prescription drugs, or dietary supplements you use. Also tell them if you smoke, drink  alcohol, or use illegal drugs. Some items may interact with your medicine. What should I watch for while using this medication? Your condition will be monitored carefully while you are receiving this medication. This medication may make you feel generally unwell. This is not uncommon as chemotherapy can affect healthy cells as well as cancer cells. Report any side effects. Continue your course of treatment even though you feel ill unless your care team tells you to stop. In some cases, you may be given additional medications to help with  side effects. Follow all directions for their use. This medication may increase your risk of getting an infection. Call your care team for advice if you get a fever, chills, sore throat, or other symptoms of a cold or flu. Do not treat yourself. Try to avoid being around people who are sick. This medication may increase your risk to bruise or bleed. Call your care team if you notice any unusual bleeding. Be careful brushing or flossing your teeth or using a toothpick because you may get an infection or bleed more easily. If you have any dental work done, tell your dentist you are receiving this medication. Avoid taking medications that contain aspirin, acetaminophen, ibuprofen, naproxen, or ketoprofen unless instructed by your care team. These medications may hide a fever. Do not treat diarrhea with over the counter products. Contact your care team if you have diarrhea that lasts more than 2 days or if it is severe and watery. This medication can make you more sensitive to the sun. Keep out of the sun. If you cannot avoid being in the sun, wear protective clothing and sunscreen. Do not use sun lamps, tanning beds, or tanning booths. Talk to your care team if you or your partner wish to become pregnant or think you might be pregnant. This medication can cause serious birth defects if taken during pregnancy and for 3 months after the last dose. A reliable form of  contraception is recommended while taking this medication and for 3 months after the last dose. Talk to your care team about effective forms of contraception. Do not father a child while taking this medication and for 3 months after the last dose. Use a condom while having sex during this time period. Do not breastfeed while taking this medication. This medication may cause infertility. Talk to your care team if you are concerned about your fertility. What side effects may I notice from receiving this medication? Side effects that you should report to your care team as soon as possible: Allergic reactions--skin rash, itching, hives, swelling of the face, lips, tongue, or throat Heart attack--pain or tightness in the chest, shoulders, arms, or jaw, nausea, shortness of breath, cold or clammy skin, feeling faint or lightheaded Heart failure--shortness of breath, swelling of the ankles, feet, or hands, sudden weight gain, unusual weakness or fatigue Heart rhythm changes--fast or irregular heartbeat, dizziness, feeling faint or lightheaded, chest pain, trouble breathing High ammonia level--unusual weakness or fatigue, confusion, loss of appetite, nausea, vomiting, seizures Infection--fever, chills, cough, sore throat, wounds that don't heal, pain or trouble when passing urine, general feeling of discomfort or being unwell Low red blood cell level--unusual weakness or fatigue, dizziness, headache, trouble breathing Pain, tingling, or numbness in the hands or feet, muscle weakness, change in vision, confusion or trouble speaking, loss of balance or coordination, trouble walking, seizures Redness, swelling, and blistering of the skin over hands and feet Severe or prolonged diarrhea Unusual bruising or bleeding Side effects that usually do not require medical attention (report to your care team if they continue or are bothersome): Dry skin Headache Increased tears Nausea Pain, redness, or swelling with  sores inside the mouth or throat Sensitivity to light Vomiting This list may not describe all possible side effects. Call your doctor for medical advice about side effects. You may report side effects to FDA at 1-800-FDA-1088. Where should I keep my medication? This medication is given in a hospital or clinic. It will not be stored at home. NOTE: This sheet is  a summary. It may not cover all possible information. If you have questions about this medicine, talk to your doctor, pharmacist, or health care provider.  2024 Elsevier/Gold Standard (2021-06-12 00:00:00)

## 2023-04-01 NOTE — Progress Notes (Signed)
Patient seen by [name] NP today  Vitals are within treatment parameters:Yes   Labs are within treatment parameters: Yes K+ 3.2. Provider submitted a prescription to the pharmacy.  Treatment plan has been signed: Yes    Per physician team, Patient is ready for treatment and there are NO modifications to the treatment plan.

## 2023-04-01 NOTE — Progress Notes (Signed)
  Ralph Davis OFFICE PROGRESS NOTE   Diagnosis: Pancreas cancer  INTERVAL HISTORY:   Ralph Davis returns as scheduled.  He completed cycle 3 NALIRIFOX 03/18/2023.  He denies nausea/vomiting.  No mouth sores.  Mild diarrhea controlled with 1 or 2 doses of Imodium.  Main complaint was increased saliva.  He noted significant improvement with the scopolamine patch.  No significant cold sensitivity.  No numbness or ting in the absence of cold exposure.  He is no longer having abdominal pain.  He has discontinued pain medication.  Appetite is better.  Objective:  Vital signs in last 24 hours:  Blood pressure 121/80, pulse 78, temperature 98.1 F (36.7 C), temperature source Temporal, resp. rate 18, height 5\' 8"  (1.727 m), weight 151 lb (68.5 kg), SpO2 100%.    HEENT: No thrush or ulcers. Resp: Lungs clear bilaterally. Cardio: Regular rate and rhythm. GI: Abdomen soft and nontender.  No hepatosplenomegaly. Vascular: No leg edema. Neuro: Alert and oriented. Skin: Palms without erythema. Port-A-Cath without erythema.  Lab Results:  Lab Results  Component Value Date   WBC 5.7 03/18/2023   HGB 10.4 (L) 03/18/2023   HCT 32.8 (L) 03/18/2023   MCV 87.5 03/18/2023   PLT 156 03/18/2023   NEUTROABS 3.9 03/18/2023    Imaging:  No results found.  Medications: I have reviewed the patient's current medications.  Assessment/Plan: Pancreas cancer  bloating/constipation/anorexia and weight loss/back pain EGD 01/20/2023-diffuse moderate mucosal changes characterized by congestion and erythema in the gastric antrum; a large ulcerated and fungating mass with stigmata of recent bleeding in the duodenal bulb; segmental moderate inflammation characterized by congestion and erythema found at the pylorus.  There was no evidence of dysplasia or malignancy in the duodenum biopsies.   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 02/06/2023 CEA and CA 19-9 elevated 02/07/2023 biopsy left abdominal soft tissue implant-metastatic moderately differentiated adenocarcinoma, MSS, tumor mutation burden 4, HRD signature negative, [id number] 02/10/2023 CTs neck and chest-no metastatic disease.  Large cystic appearing pancreatic mass and adjacent cystic/necrotic peritoneal lesions. Cycle 1 NALIRIFOX 02/18/2023 Cycle 2 NALIRIFOX 03/06/2023 Cycle 3 NALIRIFOX 03/18/2023 Cycle 4 NALIRIFOX 04/01/2023 Hypertension Hypersalivation following cycle 3 NALIRIFOX-likely related to irinotecan, improved with scopolamine patch  Disposition: Ralph Davis appears stable.  He has completed 3 cycles of NALIRIFOX.  Overall he is tolerating treatment well and performance status is better.  He developed hypersalivation following cycle 3.  He understands this is likely related to irinotecan.  Scopolamine patch was effective.  Plan to proceed with cycle 4 NALIRIFOX today as scheduled.  CBC and chemistry panel reviewed.  Labs adequate to proceed as above.  He has mild thrombocytopenia.  He will contact the office with bleeding.  He will begin a potassium supplement for mild hypokalemia.  He will return for follow-up and treatment in 2 weeks.  We are available to see him sooner if needed.    [name] ANP/GNP-BC   04/01/2023  8:50 AM

## 2023-04-02 LAB — CANCER ANTIGEN 19-9: CA 19-9: 1943 U/mL — ABNORMAL HIGH (ref 0–35)

## 2023-04-03 ENCOUNTER — Encounter: Payer: Self-pay | Admitting: Nurse Practitioner

## 2023-04-03 ENCOUNTER — Inpatient Hospital Stay: Payer: Medicare Other

## 2023-04-03 ENCOUNTER — Other Ambulatory Visit: Payer: Self-pay | Admitting: Nurse Practitioner

## 2023-04-03 VITALS — BP 128/88 | HR 80 | Temp 98.6°F | Resp 20

## 2023-04-03 DIAGNOSIS — Z5111 Encounter for antineoplastic chemotherapy: Secondary | ICD-10-CM | POA: Diagnosis not present

## 2023-04-03 DIAGNOSIS — C258 Malignant neoplasm of overlapping sites of pancreas: Secondary | ICD-10-CM | POA: Diagnosis not present

## 2023-04-03 DIAGNOSIS — I10 Essential (primary) hypertension: Secondary | ICD-10-CM | POA: Diagnosis not present

## 2023-04-03 DIAGNOSIS — D696 Thrombocytopenia, unspecified: Secondary | ICD-10-CM | POA: Diagnosis not present

## 2023-04-03 DIAGNOSIS — Z86718 Personal history of other venous thrombosis and embolism: Secondary | ICD-10-CM | POA: Diagnosis not present

## 2023-04-03 DIAGNOSIS — Z452 Encounter for adjustment and management of vascular access device: Secondary | ICD-10-CM | POA: Diagnosis not present

## 2023-04-03 DIAGNOSIS — C252 Malignant neoplasm of tail of pancreas: Secondary | ICD-10-CM

## 2023-04-03 DIAGNOSIS — E876 Hypokalemia: Secondary | ICD-10-CM | POA: Diagnosis not present

## 2023-04-03 DIAGNOSIS — K117 Disturbances of salivary secretion: Secondary | ICD-10-CM

## 2023-04-03 DIAGNOSIS — E860 Dehydration: Secondary | ICD-10-CM | POA: Diagnosis not present

## 2023-04-03 MED ORDER — SODIUM CHLORIDE 0.9% FLUSH
10.0000 mL | INTRAVENOUS | Status: DC | PRN
  Administered 2023-04-03: 10 mL

## 2023-04-03 MED ORDER — HEPARIN SOD (PORK) LOCK FLUSH 100 UNIT/ML IV SOLN
500.0000 [IU] | Freq: Once | INTRAVENOUS | Status: AC | PRN
  Administered 2023-04-03: 500 [IU]

## 2023-04-03 NOTE — Progress Notes (Signed)
I saw Ralph Davis in the infusion room during pump d/c. He is experiencing salivation and 2 episodes of gagging/vomiting while trying to clear thick phlegm. Scopolamine patch has helped salivation. He is drinking boost/ensure and the milk base is causing thickened phlegm. We discussed alternative nutrition options. I encouraged symptom management to avoid having to dose reduce chemo. Instructed him to start zofran now and compazine this evening, keep anti-emetics on board and alternate for the next few days. He is able to eat and drink. He declined IV fluids. He will call Friday for IVF if symptoms worsen or fail to improve before the weekend. All questions were answered, no other needs at this time.   Santiago Glad, NP

## 2023-04-03 NOTE — Progress Notes (Signed)
Pt c/o vomiting x 1 today due to increased salivation. Encouraged pt to continue taking anti-nausea medications. Santiago Glad, NP spoke to pt in infusion room regarding symptom management.

## 2023-04-04 ENCOUNTER — Encounter: Payer: Self-pay | Admitting: *Deleted

## 2023-04-04 ENCOUNTER — Other Ambulatory Visit: Payer: Self-pay

## 2023-04-04 ENCOUNTER — Encounter (HOSPITAL_COMMUNITY): Payer: Self-pay | Admitting: Emergency Medicine

## 2023-04-04 ENCOUNTER — Emergency Department (HOSPITAL_COMMUNITY)
Admission: EM | Admit: 2023-04-04 | Discharge: 2023-04-04 | Disposition: A | Discharge status: Home / Self Care | Payer: Medicare Other | Attending: Emergency Medicine | Admitting: Emergency Medicine

## 2023-04-04 DIAGNOSIS — Z79899 Other long term (current) drug therapy: Secondary | ICD-10-CM | POA: Insufficient documentation

## 2023-04-04 DIAGNOSIS — K625 Hemorrhage of anus and rectum: Secondary | ICD-10-CM | POA: Diagnosis not present

## 2023-04-04 DIAGNOSIS — R197 Diarrhea, unspecified: Secondary | ICD-10-CM | POA: Diagnosis not present

## 2023-04-04 DIAGNOSIS — I10 Essential (primary) hypertension: Secondary | ICD-10-CM | POA: Diagnosis not present

## 2023-04-04 DIAGNOSIS — Z8507 Personal history of malignant neoplasm of pancreas: Secondary | ICD-10-CM | POA: Insufficient documentation

## 2023-04-04 DIAGNOSIS — Z87891 Personal history of nicotine dependence: Secondary | ICD-10-CM | POA: Diagnosis not present

## 2023-04-04 LAB — COMPREHENSIVE METABOLIC PANEL
ALT: 16 U/L (ref 0–44)
AST: 15 U/L (ref 15–41)
Albumin: 2.9 g/dL — ABNORMAL LOW (ref 3.5–5.0)
Alkaline Phosphatase: 58 U/L (ref 38–126)
Anion gap: 10 (ref 5–15)
BUN: 10 mg/dL (ref 8–23)
CO2: 25 mmol/L (ref 22–32)
Calcium: 9.1 mg/dL (ref 8.9–10.3)
Chloride: 103 mmol/L (ref 98–111)
Creatinine, Ser: 0.62 mg/dL (ref 0.61–1.24)
GFR, Estimated: >60 mL/min (ref >60)
Glucose, Bld: 135 mg/dL — ABNORMAL HIGH (ref 70–99)
Potassium: 3.7 mmol/L (ref 3.5–5.1)
Sodium: 138 mmol/L (ref 135–145)
Total Bilirubin: 0.9 mg/dL (ref 0.0–1.2)
Total Protein: 6.2 g/dL — ABNORMAL LOW (ref 6.5–8.1)

## 2023-04-04 LAB — CBC WITH DIFFERENTIAL/PLATELET
Abs Immature Granulocytes: 0.01 10*3/uL (ref 0.00–0.07)
Basophils Absolute: 0.1 10*3/uL (ref 0.0–0.1)
Basophils Relative: 1 %
Eosinophils Absolute: 0.1 10*3/uL (ref 0.0–0.5)
Eosinophils Relative: 1 %
HCT: 32.1 % — ABNORMAL LOW (ref 39.0–52.0)
Hemoglobin: 10.2 g/dL — ABNORMAL LOW (ref 13.0–17.0)
Immature Granulocytes: 0 %
Lymphocytes Relative: 23 %
Lymphs Abs: 1.1 10*3/uL (ref 0.7–4.0)
MCH: 28.3 pg (ref 26.0–34.0)
MCHC: 31.8 g/dL (ref 30.0–36.0)
MCV: 89.2 fL (ref 80.0–100.0)
Monocytes Absolute: 0.3 10*3/uL (ref 0.1–1.0)
Monocytes Relative: 5 %
Neutro Abs: 3.5 10*3/uL (ref 1.7–7.7)
Neutrophils Relative %: 70 %
Platelets: 113 10*3/uL — ABNORMAL LOW (ref 150–400)
RBC: 3.6 MIL/uL — ABNORMAL LOW (ref 4.22–5.81)
RDW: 18.5 % — ABNORMAL HIGH (ref 11.5–15.5)
WBC: 5 10*3/uL (ref 4.0–10.5)
nRBC: 0 % (ref 0.0–0.2)

## 2023-04-04 LAB — CBC
HCT: 33.7 % — ABNORMAL LOW (ref 39.0–52.0)
Hemoglobin: 10.7 g/dL — ABNORMAL LOW (ref 13.0–17.0)
MCH: 28.3 pg (ref 26.0–34.0)
MCHC: 31.8 g/dL (ref 30.0–36.0)
MCV: 89.2 fL (ref 80.0–100.0)
Platelets: 152 10*3/uL (ref 150–400)
RBC: 3.78 MIL/uL — ABNORMAL LOW (ref 4.22–5.81)
RDW: 18.6 % — ABNORMAL HIGH (ref 11.5–15.5)
WBC: 6.8 10*3/uL (ref 4.0–10.5)
nRBC: 0 % (ref 0.0–0.2)

## 2023-04-04 LAB — TYPE AND SCREEN
ABO/RH(D): A NEG
Antibody Screen: NEGATIVE

## 2023-04-04 LAB — POC OCCULT BLOOD, ED: Fecal Occult Bld: POSITIVE — AB

## 2023-04-04 NOTE — ED Provider Notes (Signed)
[place] EMERGENCY DEPARTMENT AT [facility] Provider Note  CSN: [id number] Arrival date & time: 04/04/23 0448  Chief Complaint(s) Rectal Bleeding  HPI Ralph Davis is a 78 y.o. male history of pancreatic cancer currently on chemotherapy presenting to the emergency department with rectal bleeding.  Patient reports 2 episodes of rectal bleeding last night.  Also had some diarrhea.  Subsequently, has not have any bowel movements.  He feels at baseline, has had chronic fatigue, nausea, occasional vomiting since starting chemotherapy and has not had any change in the symptoms.  No fevers or chills.  No abdominal pain.  No melena.  No hematemesis.  No lightheadedness, dizziness, syncope.   Past Medical History Past Medical History:  Diagnosis Date   Arthritis    Hypertension    Pneumonia    Sleep apnea    Patient Active Problem List   Diagnosis Date Noted   Cancer of pancreas, tail (HCC) 02/11/2023   Sleep apnea    Hypertension    Degenerative joint disease of right hip 05/07/2019   Home Medication(s) Prior to Admission medications   Medication Sig Start Date End Date Taking? Authorizing Provider  ibuprofen (ADVIL) 600 MG tablet Take 600 mg by mouth every 6 (six) hours as needed for mild pain (pain score 1-3).   Yes [provider]  lidocaine-prilocaine (EMLA) cream Apply 1 Application topically as needed (Apply to Port 1-2 hours prior to its use, will start using with 2nd treatment). 02/14/23  Yes Rana Snare, NP  lisinopril (ZESTRIL) 40 MG tablet Take 40 mg by mouth daily. 02/25/19  Yes [provider]  loperamide (IMODIUM A-D) 2 MG tablet Take 2 mg by mouth 4 (four) times daily as needed for diarrhea or loose stools.   Yes [provider]  ondansetron (ZOFRAN) 8 MG tablet Take 1 tablet (8 mg total) by mouth every 8 (eight) hours as needed for nausea (Starting 3 days after chemo as needed for nausea/vomiting). 02/14/23  Yes Rana Snare, NP   potassium chloride (KLOR-CON M) 10 MEQ tablet Take 1 tablet (10 mEq total) by mouth 2 (two) times daily. 04/01/23  Yes Rana Snare, NP  prochlorperazine (COMPAZINE) 10 MG tablet Take 1 tablet (10 mg total) by mouth every 6 (six) hours as needed for nausea or vomiting. 02/14/23  Yes Rana Snare, NP  scopolamine (TRANSDERM-SCOP) 1 MG/3DAYS Place 1 patch (1.5 mg total) onto the skin every 3 (three) days. For hypersalivation 03/26/23  Yes Rana Snare, NP                                                                                                                                    Past Surgical History Past Surgical History:  Procedure Laterality Date   IR IMAGING GUIDED PORT INSERTION  02/13/2023   KNEE ARTHROPLASTY Left    TOTAL HIP ARTHROPLASTY Right 05/07/2019   Procedure: TOTAL HIP ARTHROPLASTY;  Surgeon: Frederico Hamman, MD;  Location: [facility] ORS;  Service: Orthopedics;  Laterality: Right;   VASECTOMY  1970   VASECTOMY REVERSAL     Family History History reviewed. No pertinent family history.  Social History Social History   Tobacco Use   Smoking status: Former    Current packs/day: 0.00    Types: Cigarettes    Quit date: 1977    Years since quitting: 48.1   Smokeless tobacco: Never  Vaping Use   Vaping status: Never Used  Substance Use Topics   Alcohol use: Not Currently    Comment: occas.   Drug use: Never   Allergies Patient has no known allergies.  Review of Systems Review of Systems  All other systems reviewed and are negative.   Physical Exam Vital Signs  I have reviewed the triage vital signs BP 137/85   Pulse 84   Temp 97.9 F (36.6 C) (Oral)   Resp 20   Ht 5\' 8"  (1.727 m)   Wt 70.3 kg   SpO2 97%   BMI 23.57 kg/m  Physical Exam Vitals and nursing note reviewed.  Constitutional:      General: He is not in acute distress.    Appearance: Normal appearance.  HENT:     Mouth/Throat:     Mouth: Mucous membranes are moist.  Eyes:      Conjunctiva/sclera: Conjunctivae normal.  Cardiovascular:     Rate and Rhythm: Normal rate and regular rhythm.  Pulmonary:     Effort: Pulmonary effort is normal. No respiratory distress.     Breath sounds: Normal breath sounds.  Abdominal:     General: Abdomen is flat.     Palpations: Abdomen is soft.     Tenderness: There is no abdominal tenderness.  Musculoskeletal:     Right lower leg: No edema.     Left lower leg: No edema.  Skin:    General: Skin is warm and dry.     Capillary Refill: Capillary refill takes less than 2 seconds.  Neurological:     Mental Status: He is alert and oriented to person, place, and time. Mental status is at baseline.  Psychiatric:        Mood and Affect: Mood normal.        Behavior: Behavior normal.     ED Results and Treatments Labs (all labs ordered are listed, but only abnormal results are displayed) Labs Reviewed  COMPREHENSIVE METABOLIC PANEL - Abnormal; Notable for the following components:      Result Value   Glucose, Bld 135 (*)    Total Protein 6.2 (*)    Albumin 2.9 (*)    All other components within normal limits  CBC - Abnormal; Notable for the following components:   RBC 3.78 (*)    Hemoglobin 10.7 (*)    HCT 33.7 (*)    RDW 18.6 (*)    All other components within normal limits  CBC WITH DIFFERENTIAL/PLATELET - Abnormal; Notable for the following components:   RBC 3.60 (*)    Hemoglobin 10.2 (*)    HCT 32.1 (*)    RDW 18.5 (*)    Platelets 113 (*)    All other components within normal limits  POC OCCULT BLOOD, ED - Abnormal; Notable for the following components:   Fecal Occult Bld POSITIVE (*)    All other components within normal limits  TYPE AND SCREEN  Radiology No results found.  Pertinent labs & imaging results that were available during my care of the patient were reviewed by me and  considered in my medical decision making (see MDM for details).  Medications Ordered in ED Medications - No data to display                                                                                                                                   Procedures Procedures  (including critical care time)  Medical Decision Making / ED Course   MDM:  78 year old presenting with rectal bleeding.  Patient overall well-appearing, differential includes bleeding from known duodenal mass, diverticulosis, AVM, hemorrhoidal bleeding.  Patient hemoglobin here is reassuring, comparable to recent baseline. Patient not on anticoagulation. Repeated hemoglobin after significant time difference with minimal drop of 10.7-10.2, discussed with patient's GI physician Dr. Bosie Clos, given stability of hemoglobin, normal vitals, no abdominal pain and patient feeling at baseline, agrees that patient should be safe for discharge.  Patient will notify his oncologist, Dr. Truett Perna.  Recommended that he also follow-up with Dr. Bosie Clos.  Gave patient strict return precautions for any recurrent bleeding, lightheadedness or dizziness, or any other concerning symptoms. Will discharge patient to home. All questions answered. Patient comfortable with plan of discharge. Return precautions discussed with patient and specified on the after visit summary.       Additional history obtained:  -External records from outside source obtained and reviewed including: Chart review including previous notes, labs, imaging, consultation notes including prior labs    Lab Tests: -I ordered, reviewed, and interpreted labs.   The pertinent results include:   Labs Reviewed  COMPREHENSIVE METABOLIC PANEL - Abnormal; Notable for the following components:      Result Value   Glucose, Bld 135 (*)    Total Protein 6.2 (*)    Albumin 2.9 (*)    All other components within normal limits  CBC - Abnormal; Notable for the following  components:   RBC 3.78 (*)    Hemoglobin 10.7 (*)    HCT 33.7 (*)    RDW 18.6 (*)    All other components within normal limits  CBC WITH DIFFERENTIAL/PLATELET - Abnormal; Notable for the following components:   RBC 3.60 (*)    Hemoglobin 10.2 (*)    HCT 32.1 (*)    RDW 18.5 (*)    Platelets 113 (*)    All other components within normal limits  POC OCCULT BLOOD, ED - Abnormal; Notable for the following components:   Fecal Occult Bld POSITIVE (*)    All other components within normal limits  TYPE AND SCREEN    Notable for +FOBT,. Stable HBG     Medicines ordered and prescription drug management: No orders of the defined types were placed in this encounter.   -I have reviewed the patients home medicines and have made adjustments as needed   Consultations Obtained: I requested  consultation with the gastroenterologist,  and discussed lab and imaging findings as well as pertinent plan - they recommend: discharge   Reevaluation: After the interventions noted above, I reevaluated the patient and found that their symptoms have stayed the same  Co morbidities that complicate the patient evaluation  Past Medical History:  Diagnosis Date   Arthritis    Hypertension    Pneumonia    Sleep apnea       Dispostion: Disposition decision including need for hospitalization was considered, and patient discharged from emergency department.    Final Clinical Impression(s) / ED Diagnoses Final diagnoses:  Rectal bleeding     This chart was dictated using voice recognition software.  Despite best efforts to proofread,  errors can occur which can change the documentation meaning.    Lonell Grandchild, MD 04/04/23 [phone]

## 2023-04-04 NOTE — ED Triage Notes (Addendum)
Patient reports 2 episodes of diarrhea with blood last night. Patient also reports emesis x1 yesterday after chemo infusion - no blood in vomit. Denies blood thinners. Denies chest pain, shortness of breath, or dizziness.

## 2023-04-04 NOTE — Discharge Instructions (Signed)
We evaluated you for your rectal bleeding.  Your red blood cell count was stable in the emergency department.  We discussed your laboratory tests and bleeding with Dr. Bosie Clos, he does not think that you need to stay in the hospital at this time.  Please touch base with your oncologist as well.  If you do have any recurrent episodes of rectal bleeding or develop any new symptoms such as lightheadedness or dizziness, fainting, black stools, severe fatigue, or any other new symptoms, please return to the emergency department.

## 2023-04-04 NOTE — Progress Notes (Signed)
Ralph Davis had called [facility] with report of loose BM that was red x 2 this am. Was instructed to go to emergency room. At time of this note, it was seen that he did comply.

## 2023-04-07 ENCOUNTER — Telehealth: Payer: Self-pay | Admitting: *Deleted

## 2023-04-07 NOTE — Telephone Encounter (Signed)
Mrs. Bollier left message late Friday that he was seen in ER and evaluated and Dr. Bosie Clos was consulted. Lab stayed stable with no further rectal bleeding. Was instructed to continue to monitor for now.

## 2023-04-09 ENCOUNTER — Other Ambulatory Visit: Payer: Self-pay

## 2023-04-09 ENCOUNTER — Telehealth: Payer: Self-pay

## 2023-04-09 DIAGNOSIS — C252 Malignant neoplasm of tail of pancreas: Secondary | ICD-10-CM

## 2023-04-09 NOTE — Telephone Encounter (Signed)
The patient's spouse contacted [place] to report that the patient currently weighs 139.0 lbs. The patient is experiencing difficulties with appetite and is unable to retain protein drinks. I reach out   with the nutritionist to discuss further support for the patient.

## 2023-04-10 ENCOUNTER — Telehealth: Payer: Self-pay | Admitting: Dietician

## 2023-04-10 ENCOUNTER — Other Ambulatory Visit: Payer: Self-pay | Admitting: Oncology

## 2023-04-10 DIAGNOSIS — C252 Malignant neoplasm of tail of pancreas: Secondary | ICD-10-CM

## 2023-04-10 NOTE — Telephone Encounter (Signed)
Attempted to contact wife of patient via telephone for requested nutrition follow-up. Left message with contact information.

## 2023-04-11 ENCOUNTER — Encounter: Payer: Self-pay | Admitting: Nurse Practitioner

## 2023-04-11 ENCOUNTER — Other Ambulatory Visit: Payer: Medicare Other

## 2023-04-11 ENCOUNTER — Telehealth: Payer: Self-pay | Admitting: *Deleted

## 2023-04-11 ENCOUNTER — Encounter: Payer: Self-pay | Admitting: *Deleted

## 2023-04-11 ENCOUNTER — Inpatient Hospital Stay: Payer: Medicare Other

## 2023-04-11 ENCOUNTER — Inpatient Hospital Stay: Payer: Medicare Other | Admitting: Nurse Practitioner

## 2023-04-11 ENCOUNTER — Other Ambulatory Visit: Payer: Self-pay

## 2023-04-11 VITALS — BP 104/73 | HR 99 | Temp 98.1°F | Resp 18 | Ht 68.0 in | Wt 144.9 lb

## 2023-04-11 DIAGNOSIS — E860 Dehydration: Secondary | ICD-10-CM

## 2023-04-11 DIAGNOSIS — Z452 Encounter for adjustment and management of vascular access device: Secondary | ICD-10-CM | POA: Diagnosis not present

## 2023-04-11 DIAGNOSIS — D696 Thrombocytopenia, unspecified: Secondary | ICD-10-CM | POA: Diagnosis not present

## 2023-04-11 DIAGNOSIS — C252 Malignant neoplasm of tail of pancreas: Secondary | ICD-10-CM

## 2023-04-11 DIAGNOSIS — Z86718 Personal history of other venous thrombosis and embolism: Secondary | ICD-10-CM | POA: Diagnosis not present

## 2023-04-11 DIAGNOSIS — K117 Disturbances of salivary secretion: Secondary | ICD-10-CM | POA: Diagnosis not present

## 2023-04-11 DIAGNOSIS — I10 Essential (primary) hypertension: Secondary | ICD-10-CM | POA: Diagnosis not present

## 2023-04-11 DIAGNOSIS — Z5111 Encounter for antineoplastic chemotherapy: Secondary | ICD-10-CM | POA: Diagnosis not present

## 2023-04-11 DIAGNOSIS — E876 Hypokalemia: Secondary | ICD-10-CM | POA: Diagnosis not present

## 2023-04-11 DIAGNOSIS — C258 Malignant neoplasm of overlapping sites of pancreas: Secondary | ICD-10-CM | POA: Diagnosis not present

## 2023-04-11 LAB — CBC WITH DIFFERENTIAL (CANCER CENTER ONLY)
Abs Immature Granulocytes: 0.01 10*3/uL (ref 0.00–0.07)
Basophils Absolute: 0 10*3/uL (ref 0.0–0.1)
Basophils Relative: 1 %
Eosinophils Absolute: 0.1 10*3/uL (ref 0.0–0.5)
Eosinophils Relative: 3 %
HCT: 32.6 % — ABNORMAL LOW (ref 39.0–52.0)
Hemoglobin: 10.4 g/dL — ABNORMAL LOW (ref 13.0–17.0)
Immature Granulocytes: 0 %
Lymphocytes Relative: 26 %
Lymphs Abs: 1.1 10*3/uL (ref 0.7–4.0)
MCH: 28.3 pg (ref 26.0–34.0)
MCHC: 31.9 g/dL (ref 30.0–36.0)
MCV: 88.8 fL (ref 80.0–100.0)
Monocytes Absolute: 0.3 10*3/uL (ref 0.1–1.0)
Monocytes Relative: 7 %
Neutro Abs: 2.5 10*3/uL (ref 1.7–7.7)
Neutrophils Relative %: 63 %
Platelet Count: 119 10*3/uL — ABNORMAL LOW (ref 150–400)
RBC: 3.67 MIL/uL — ABNORMAL LOW (ref 4.22–5.81)
RDW: 19.3 % — ABNORMAL HIGH (ref 11.5–15.5)
WBC Count: 4 10*3/uL (ref 4.0–10.5)
nRBC: 0 % (ref 0.0–0.2)

## 2023-04-11 LAB — CMP (CANCER CENTER ONLY)
ALT: 8 U/L (ref 0–44)
AST: 10 U/L — ABNORMAL LOW (ref 15–41)
Albumin: 3.6 g/dL (ref 3.5–5.0)
Alkaline Phosphatase: 68 U/L (ref 38–126)
Anion gap: 8 (ref 5–15)
BUN: 11 mg/dL (ref 8–23)
CO2: 25 mmol/L (ref 22–32)
Calcium: 9.1 mg/dL (ref 8.9–10.3)
Chloride: 103 mmol/L (ref 98–111)
Creatinine: 0.56 mg/dL — ABNORMAL LOW (ref 0.61–1.24)
GFR, Estimated: >60 mL/min (ref >60)
Glucose, Bld: 121 mg/dL — ABNORMAL HIGH (ref 70–99)
Potassium: 3.5 mmol/L (ref 3.5–5.1)
Sodium: 136 mmol/L (ref 135–145)
Total Bilirubin: 0.6 mg/dL (ref 0.0–1.2)
Total Protein: 6.5 g/dL (ref 6.5–8.1)

## 2023-04-11 MED ORDER — SODIUM CHLORIDE 0.9 % IV SOLN: INTRAVENOUS | Status: AC

## 2023-04-11 MED ORDER — HEPARIN SOD (PORK) LOCK FLUSH 100 UNIT/ML IV SOLN
500.0000 [IU] | Freq: Once | INTRAVENOUS | Status: AC
  Administered 2023-04-11: 500 [IU] via INTRAVENOUS

## 2023-04-11 MED ORDER — SODIUM CHLORIDE 0.9% FLUSH
10.0000 mL | INTRAVENOUS | Status: DC | PRN
Start: 2023-04-11 — End: 2023-04-11
  Administered 2023-04-11: 10 mL via INTRAVENOUS

## 2023-04-11 MED ORDER — PROCHLORPERAZINE MALEATE 10 MG PO TABS: 10.0000 mg | ORAL_TABLET | Freq: Four times a day (QID) | ORAL | 0 refills | Status: AC | PRN

## 2023-04-11 NOTE — Progress Notes (Signed)
Pt. Tolerated fluids well v/s stable at discharge. Per [name] NP Pt to hold blood pressure medication until Mondays Follow up visit. Pt and his wife informed and verbalized understanding. No further problems or concerns. Pt left clinic ambulatory alongside his wife.

## 2023-04-11 NOTE — Patient Instructions (Addendum)
Implanted [facility] Guide An implanted port is a device that is placed under the skin. It is usually placed in the chest. The device may vary based on the need. Implanted ports can be used to give IV medicine, to take blood, or to give fluids. You may have an implanted port if: You need IV medicine that would be irritating to the small veins in your hands or arms. You need IV medicines, such as chemotherapy, for a long period of time. You need IV nutrition for a long period of time. You may have fewer limitations when using a port than you would if you used other types of long-term IVs. You will also likely be able to return to normal activities after your incision heals. An implanted port has two main parts: Reservoir. The reservoir is the part where a needle is inserted to give medicines or draw blood. The reservoir is round. After the port is placed, it appears as a small, raised area under your skin. Catheter. The catheter is a small, thin tube that connects the reservoir to a vein. Medicine that is inserted into the reservoir goes into the catheter and then into the vein. How is my port accessed? To access your port: A numbing cream may be placed on the skin over the port site. Your health care provider will put on a mask and sterile gloves. The skin over your port will be cleaned carefully with a germ-killing soap and allowed to dry. Your health care provider will gently pinch the port and insert a needle into it. Your health care provider will check for a blood return to make sure the port is in the vein and is still working (patent). If your port needs to remain accessed to get medicine continuously (constant infusion), your health care provider will place a clear bandage (dressing) over the needle site. The dressing and needle will need to be changed every week, or as told by your health care provider. What is flushing? Flushing helps keep the port working. Follow instructions from your  health care provider about how and when to flush the port. Ports are usually flushed with saline solution or a medicine called heparin. The need for flushing will depend on how the port is used: If the port is only used from time to time to give medicines or draw blood, the port may need to be flushed: Before and after medicines have been given. Before and after blood has been drawn. As part of routine maintenance. Flushing may be recommended every 4-6 weeks. If a constant infusion is running, the port may not need to be flushed. Throw away any syringes in a disposal container that is meant for sharp items (sharps container). You can buy a sharps container from a pharmacy, or you can make one by using an empty hard plastic bottle with a cover. How long will my port stay implanted? The port can stay in for as long as your health care provider thinks it is needed. When it is time for the port to come out, a surgery will be done to remove it. The surgery will be similar to the procedure that was done to put the port in. Follow these instructions at home: Caring for your port and port site Flush your port as told by your health care provider. If you need an infusion over several days, follow instructions from your health care provider about how to take care of your port site. Make sure you: Change your  dressing as told by your health care provider. Wash your hands with soap and water for at least 20 seconds before and after you change your dressing. If soap and water are not available, use alcohol-based hand sanitizer. Place any used dressings or infusion bags into a plastic bag. Throw that bag in the trash. Keep the dressing that covers the needle clean and dry. Do not get it wet. Do not use scissors or sharp objects near the infusion tubing. Keep any external tubes clamped, unless they are being used. Check your port site every day for signs of infection. Check for: Redness, swelling, or  pain. Fluid or blood. Warmth. Pus or a bad smell. Protect the skin around the port site. Avoid wearing bra straps that rub or irritate the site. Protect the skin around your port from seat belts. Place a soft pad over your chest if needed. Bathe or shower as told by your health care provider. The site may get wet as long as you are not actively receiving an infusion. General instructions  Return to your normal activities as told by your health care provider. Ask your health care provider what activities are safe for you. Carry a medical alert card or wear a medical alert bracelet at all times. This will let health care providers know that you have an implanted port in case of an emergency. Where to find more information American Cancer Society: www.cancer.org American Society of Clinical Oncology: www.cancer.net Contact a health care provider if: You have a fever or chills. You have redness, swelling, or pain at the port site. You have fluid or blood coming from your port site. Your incision feels warm to the touch. You have pus or a bad smell coming from the port site. Summary Implanted ports are usually placed in the chest for long-term IV access. Follow instructions from your health care provider about flushing the port and changing bandages (dressings). Take care of the area around your port by avoiding clothing that puts pressure on the area, and by watching for signs of infection. Protect the skin around your port from seat belts. Place a soft pad over your chest if needed. Contact a health care provider if you have a fever or you have redness, swelling, pain, fluid, or a bad smell at the port site. This information is not intended to replace advice given to you by your health care provider. Make sure you discuss any questions you have with your health care provider.  Document Revised: 08/08/2020 Document Reviewed: 08/08/2020 Elsevier Patient Education  2024 Elsevier  Inc.Rehydration, Older Adult  Rehydration is the replacement of fluids, salts, and minerals in the body (electrolytes) that are lost during dehydration. Dehydration is when there is not enough water or other fluids in the body. This happens when you lose more fluids than you take in. People who are age 63 or older have a higher risk of dehydration than younger adults. This is because in older age, the body: Is less able to maintain the right amount of water. Does not respond to temperature changes as well. Does not get a sense of thirst as easily or quickly. Other causes include: Not drinking enough fluids. This can occur when you are ill, when you forget to drink, or when you are doing activities that require a lot of energy, especially in hot weather. Conditions that cause loss of water or other fluids. These include diarrhea, vomiting, sweating, or urinating a lot. Other illnesses, such as fever or infection. Certain  medicines, such as those that remove excess fluid from the body (diuretics). Symptoms of mild or moderate dehydration may include thirst, dry lips and mouth, and dizziness. Symptoms of severe dehydration may include increased heart rate, confusion, fainting, and not urinating. In severe cases, you may need to get fluids through an IV at the hospital. For mild or moderate cases, you can usually rehydrate at home by drinking certain fluids as told by your health care provider. What are the risks? Rehydration is usually safe. Taking in too much fluid (overhydration) can be a problem but is rare. Overhydration can cause an imbalance of electrolytes in the body, kidney failure, fluid in the lungs, or a decrease in salt (sodium) levels in the body. Supplies needed: You will need an oral rehydration solution (ORS) if your health care provider tells you to use one. This is a drink to treat dehydration. It can be found in pharmacies and retail stores. How to rehydrate Fluids Follow  instructions from your health care provider about what to drink. The kind of fluid and the amount you should drink depend on your condition. In general, you should choose drinks that you prefer. If told by your health care provider, drink an ORS. Make an ORS by following instructions on the package. Start by drinking small amounts, about  cup (120 mL) every 5-10 minutes. Slowly increase how much you drink until you have taken in the amount recommended by your health care provider. Drink enough clear fluids to keep your urine pale yellow. If you were told to drink an ORS, finish it first, then start slowly drinking other clear fluids. Drink fluids such as: Water. This includes sparkling and flavored water. Drinking only water can lead to having too little sodium in your body (hyponatremia). Follow the advice of your health care provider. Water from ice chips you suck on. Fruit juice with water added to it(diluted). Sports drinks. Hot or cold herbal teas. Broth-based soups. Coffee. Milk or milk products. Food Follow instructions from your health care provider about what to eat while you rehydrate. Your health care provider may recommend that you slowly begin eating regular foods in small amounts. Eat foods that contain a healthy balance of electrolytes, such as bananas, oranges, potatoes, tomatoes, and spinach. Avoid foods that are greasy or contain a lot of sugar. In some cases, you may get nutrition through a feeding tube that is passed through your nose and into your stomach (nasogastric tube, or NG tube). This may be done if you have uncontrolled vomiting or diarrhea. Drinks to avoid  Certain drinks may make dehydration worse. While you rehydrate, avoid drinking alcohol. How to tell if you are recovering from dehydration You may be getting better if: You are urinating more often than before you started rehydrating. Your urine is pale yellow. Your energy level improves. You vomit less  often. You have diarrhea less often. Your appetite improves or returns to normal. You feel less dizzy or light-headed. Your skin tone and color start to look more normal. Follow these instructions at home: Take over-the-counter and prescription medicines only as told by your health care provider. Do not take sodium tablets. Doing this can lead to having too much sodium in your body (hypernatremia). Contact a health care provider if: You continue to have symptoms of mild or moderate dehydration, such as: Thirst. Dry lips. Slightly dry mouth. Dizziness. Dark urine or less urine than usual. Muscle cramps. You continue to vomit or have diarrhea. Get help right away  if: You have symptoms of dehydration that get worse. You have a fever. You have a severe headache. You have been vomiting and have problems, such as: Your vomiting gets worse. Your vomit includes blood or green matter (bile). You cannot eat or drink without vomiting. You have problems with urination or bowel movements, such as: Diarrhea that gets worse. Blood in your stool (feces). This may cause stool to look black and tarry. Not urinating, or urinating only a small amount of very dark urine, within 6-8 hours. You have trouble breathing. You have symptoms that get worse with treatment. These symptoms may be an emergency. Get help right away. Call 911. Do not wait to see if the symptoms will go away. Do not drive yourself to the hospital. This information is not intended to replace advice given to you by your health care provider. Make sure you discuss any questions you have with your health care provider. Document Revised: 06/20/2021 Document Reviewed: 06/18/2021 Elsevier Patient Education  2024 [employer]

## 2023-04-11 NOTE — Progress Notes (Signed)
[facility] OFFICE PROGRESS NOTE   Diagnosis: Pancreas cancer  INTERVAL HISTORY:   Ralph Davis returns prior to scheduled follow-up.  He completed cycle 4 NALIRIFOX 04/01/2023.  He denies significant nausea after chemotherapy.  He feels he accumulates mucus at the back of his throat and when he attempts to expectorate it triggers his "gag reflex" and he vomits.  He has been taking Compazine on a scheduled basis.  He is sleeping a significant portion of the day (this is not new, reports ongoing since beginning treatment).  The excessive salivation has improved with the scopolamine patch but he continues to note some "accumulation".  No diarrhea though he notes stools are periodically "near liquid".  He is unable to tolerate milk-based nutritional supplements.  He feels weak, lightheaded.  No fever.  No bleeding.  Objective:  Vital signs in last 24 hours:  Blood pressure 104/73, pulse 99, temperature 98.1 F (36.7 C), temperature source Temporal, resp. rate 18, height 5\' 8"  (1.727 m), weight 144 lb 14.4 oz (65.7 kg), SpO2 100%.    HEENT: Mucous membranes appear moist.  No thrush or ulcers. Resp: Lungs clear. Cardio: Regular rate and rhythm. GI: Abdomen soft and nontender.  No hepatosplenomegaly. Vascular: No leg edema. Neuro: Alert and oriented. Skin: Mild decrease in skin turgor. Port-A-Cath without erythema.  Lab Results:  Lab Results  Component Value Date   WBC 5.0 04/04/2023   HGB 10.2 (L) 04/04/2023   HCT 32.1 (L) 04/04/2023   MCV 89.2 04/04/2023   PLT 113 (L) 04/04/2023   NEUTROABS 3.5 04/04/2023    Imaging:  No results found.  Medications: I have reviewed the patient's current medications.  Assessment/Plan: Pancreas cancer  bloating/constipation/anorexia and weight loss/back pain EGD 01/20/2023-diffuse moderate mucosal changes characterized by congestion and erythema in the gastric antrum; a large ulcerated and fungating mass with stigmata of  recent bleeding in the duodenal bulb; segmental moderate inflammation characterized by congestion and erythema found at the pylorus.  There was no evidence of dysplasia or malignancy in the duodenum biopsies.   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 02/06/2023 CEA and CA 19-9 elevated 02/07/2023 biopsy left abdominal soft tissue implant-metastatic moderately differentiated adenocarcinoma, MSS, tumor mutation burden 4, HRD signature negative, [id number] 02/10/2023 CTs neck and chest-no metastatic disease.  Large cystic appearing pancreatic mass and adjacent cystic/necrotic peritoneal lesions. Cycle 1 NALIRIFOX 02/18/2023 Cycle 2 NALIRIFOX 03/06/2023 Cycle 3 NALIRIFOX 03/18/2023 Cycle 4 NALIRIFOX 04/01/2023 Hypertension Hypersalivation following cycle 3 NALIRIFOX-likely related to irinotecan, improved with scopolamine patch  Disposition: Mr. Paragas appears stable.  He has completed 4 cycles of NALIRIFOX.  Aside from the excessive salivation he seems to be tolerating treatment well.  He will continue use of the scopolamine patch.  Appetite overall is poor.  He is unable to tolerate milk-based nutritional supplements.  He will try water-based.  We will make a referral to the [facility] dietitian.    We recommended he discontinue Compazine due to the potential for sedation.  He has been taking it around-the-clock to prevent nausea.  Nausea has not been an issue for him.  He will try Zofran over the  weekend if he has nausea.  He has lost a considerable amount of weight since diagnosis.  He is mildly hypotensive in the office today.  He will discontinue lisinopril.  He is likely dehydrated.  We will give him a liter of IV fluids while he is in the office today.  He will work on increasing fluid intake at home.  He will return for follow-up as scheduled 04/14/2023.  He understands to seek evaluation over the weekend with any decline in his condition.    [name] ANP/GNP-BC   04/11/2023  10:39 AM

## 2023-04-11 NOTE — Patient Instructions (Signed)

## 2023-04-11 NOTE — Telephone Encounter (Signed)
Nurse navigator spoke w/patient. Very sedated and not eating/drinking. Will see NP today with labs.

## 2023-04-11 NOTE — Progress Notes (Signed)
PATIENT NAVIGATOR PROGRESS NOTE  Name: Ralph Davis Date: 04/11/2023 MRN: [id number]  DOB: 10/01/1945   Reason for visit:  Telephone call  Comments:  Returned call from wife Maht[place], and also spoke with Ron. States that he is not eating well although secretions are better with scopolamine patch he is gagging whenever he drinks Ensure or Boost.  He states that he is taking Compazine around the clock whether he is nauseous or not due to fear of nausea.  He is not taking Zofran.   No appetite but making himself eat. "Not eating a lot"  Sleeping most of day per pt's report and sounds very sluggish on the phone.  Appt with [name] NP made with labs for later this am for evaluation    Time spent counseling/coordinating care: > 60 minutes

## 2023-04-14 ENCOUNTER — Encounter: Payer: Self-pay | Admitting: Nurse Practitioner

## 2023-04-14 ENCOUNTER — Inpatient Hospital Stay: Payer: Medicare Other

## 2023-04-14 ENCOUNTER — Inpatient Hospital Stay: Payer: Medicare Other | Admitting: Nurse Practitioner

## 2023-04-14 VITALS — BP 126/80 | HR 78 | Temp 98.2°F | Resp 18

## 2023-04-14 VITALS — BP 123/81 | HR 61 | Temp 98.2°F | Resp 18 | Ht 68.0 in | Wt 142.5 lb

## 2023-04-14 DIAGNOSIS — C252 Malignant neoplasm of tail of pancreas: Secondary | ICD-10-CM | POA: Diagnosis not present

## 2023-04-14 DIAGNOSIS — Z86718 Personal history of other venous thrombosis and embolism: Secondary | ICD-10-CM | POA: Diagnosis not present

## 2023-04-14 DIAGNOSIS — E860 Dehydration: Secondary | ICD-10-CM | POA: Diagnosis not present

## 2023-04-14 DIAGNOSIS — Z5111 Encounter for antineoplastic chemotherapy: Secondary | ICD-10-CM | POA: Diagnosis not present

## 2023-04-14 DIAGNOSIS — C258 Malignant neoplasm of overlapping sites of pancreas: Secondary | ICD-10-CM | POA: Diagnosis not present

## 2023-04-14 DIAGNOSIS — K117 Disturbances of salivary secretion: Secondary | ICD-10-CM | POA: Diagnosis not present

## 2023-04-14 DIAGNOSIS — D696 Thrombocytopenia, unspecified: Secondary | ICD-10-CM | POA: Diagnosis not present

## 2023-04-14 DIAGNOSIS — E876 Hypokalemia: Secondary | ICD-10-CM | POA: Diagnosis not present

## 2023-04-14 DIAGNOSIS — I10 Essential (primary) hypertension: Secondary | ICD-10-CM | POA: Diagnosis not present

## 2023-04-14 DIAGNOSIS — Z452 Encounter for adjustment and management of vascular access device: Secondary | ICD-10-CM | POA: Diagnosis not present

## 2023-04-14 LAB — CMP (CANCER CENTER ONLY)
ALT: 9 U/L (ref 0–44)
AST: 9 U/L — ABNORMAL LOW (ref 15–41)
Albumin: 3.7 g/dL (ref 3.5–5.0)
Alkaline Phosphatase: 81 U/L (ref 38–126)
Anion gap: 7 (ref 5–15)
BUN: 11 mg/dL (ref 8–23)
CO2: 26 mmol/L (ref 22–32)
Calcium: 9.1 mg/dL (ref 8.9–10.3)
Chloride: 105 mmol/L (ref 98–111)
Creatinine: 0.57 mg/dL — ABNORMAL LOW (ref 0.61–1.24)
GFR, Estimated: >60 mL/min (ref >60)
Glucose, Bld: 162 mg/dL — ABNORMAL HIGH (ref 70–99)
Potassium: 3.5 mmol/L (ref 3.5–5.1)
Sodium: 138 mmol/L (ref 135–145)
Total Bilirubin: 0.5 mg/dL (ref 0.0–1.2)
Total Protein: 6.5 g/dL (ref 6.5–8.1)

## 2023-04-14 LAB — CBC WITH DIFFERENTIAL (CANCER CENTER ONLY)
Abs Immature Granulocytes: 0.01 10*3/uL (ref 0.00–0.07)
Basophils Absolute: 0 10*3/uL (ref 0.0–0.1)
Basophils Relative: 0 %
Eosinophils Absolute: 0.1 10*3/uL (ref 0.0–0.5)
Eosinophils Relative: 2 %
HCT: 32.6 % — ABNORMAL LOW (ref 39.0–52.0)
Hemoglobin: 10.4 g/dL — ABNORMAL LOW (ref 13.0–17.0)
Immature Granulocytes: 0 %
Lymphocytes Relative: 32 %
Lymphs Abs: 1.5 10*3/uL (ref 0.7–4.0)
MCH: 28.6 pg (ref 26.0–34.0)
MCHC: 31.9 g/dL (ref 30.0–36.0)
MCV: 89.6 fL (ref 80.0–100.0)
Monocytes Absolute: 0.3 10*3/uL (ref 0.1–1.0)
Monocytes Relative: 7 %
Neutro Abs: 2.7 10*3/uL (ref 1.7–7.7)
Neutrophils Relative %: 59 %
Platelet Count: 138 10*3/uL — ABNORMAL LOW (ref 150–400)
RBC: 3.64 MIL/uL — ABNORMAL LOW (ref 4.22–5.81)
RDW: 19.5 % — ABNORMAL HIGH (ref 11.5–15.5)
WBC Count: 4.6 10*3/uL (ref 4.0–10.5)
nRBC: 0 % (ref 0.0–0.2)

## 2023-04-14 LAB — SAMPLE TO BLOOD BANK

## 2023-04-14 MED ORDER — SODIUM CHLORIDE 0.9 % IV SOLN
50.0000 mg/m2 | Freq: Once | INTRAVENOUS | Status: AC
  Administered 2023-04-14: 86 mg via INTRAVENOUS
  Filled 2023-04-14: qty 20

## 2023-04-14 MED ORDER — DEXTROSE 5 % IV SOLN: INTRAVENOUS | Status: DC

## 2023-04-14 MED ORDER — DEXAMETHASONE SODIUM PHOSPHATE 10 MG/ML IJ SOLN
10.0000 mg | Freq: Once | INTRAMUSCULAR | Status: AC
  Administered 2023-04-14: 10 mg via INTRAVENOUS
  Filled 2023-04-14: qty 1

## 2023-04-14 MED ORDER — SODIUM CHLORIDE 0.9% FLUSH
10.0000 mL | INTRAVENOUS | Status: DC | PRN
Start: 2023-04-14 — End: 2023-04-14

## 2023-04-14 MED ORDER — HEPARIN SOD (PORK) LOCK FLUSH 100 UNIT/ML IV SOLN: 500.0000 [IU] | Freq: Once | INTRAVENOUS | Status: DC | PRN

## 2023-04-14 MED ORDER — SODIUM CHLORIDE 0.9 % IV SOLN
2400.0000 mg/m2 | INTRAVENOUS | Status: DC
  Administered 2023-04-14: 4200 mg via INTRAVENOUS
  Filled 2023-04-14: qty 84

## 2023-04-14 MED ORDER — PALONOSETRON HCL INJECTION 0.25 MG/5ML
0.2500 mg | Freq: Once | INTRAVENOUS | Status: AC
  Administered 2023-04-14: 0.25 mg via INTRAVENOUS
  Filled 2023-04-14: qty 5

## 2023-04-14 MED ORDER — ATROPINE SULFATE 1 MG/ML IV SOLN
0.5000 mg | Freq: Once | INTRAVENOUS | Status: AC | PRN
  Administered 2023-04-14: 0.5 mg via INTRAVENOUS
  Filled 2023-04-14: qty 1

## 2023-04-14 MED ORDER — OXALIPLATIN CHEMO INJECTION 100 MG/20ML
60.0000 mg/m2 | Freq: Once | INTRAVENOUS | Status: AC
  Administered 2023-04-14: 100 mg via INTRAVENOUS
  Filled 2023-04-14: qty 20

## 2023-04-14 MED ORDER — LEUCOVORIN CALCIUM INJECTION 350 MG
400.0000 mg/m2 | Freq: Once | INTRAVENOUS | Status: AC
  Administered 2023-04-14: 704 mg via INTRAVENOUS
  Filled 2023-04-14: qty 35.2

## 2023-04-14 NOTE — Progress Notes (Signed)
  [facility] OFFICE PROGRESS NOTE   Diagnosis: Pancreas cancer  INTERVAL HISTORY:   [name] returns as scheduled.  He completed cycle 4 NALIRIFOX 04/01/2023.  He did not tolerate the clear nutritional supplement.  Aside from that no nausea or vomiting.  No mouth sores.  No diarrhea.  He intermittently notes difficulty initiating the urine stream.  No abdominal pain.  No numbness or tingling in the hands or feet.  He is hungry.  No lightheadedness or dizziness.  Objective:  Vital signs in last 24 hours:  Blood pressure 123/81, pulse 61, temperature 98.2 F (36.8 C), temperature source Temporal, resp. rate 18, height 5\' 8"  (1.727 m), weight 142 lb 8 oz (64.6 kg), SpO2 98%.    HEENT: No thrush or ulcers.  Mucous membranes appear moist. Resp: Lungs clear bilaterally. Cardio: Regular rate and rhythm. GI: Abdomen soft and nontender.  No hepatosplenomegaly. Vascular: No leg edema. Skin: Skin turgor is intact.  Palms without erythema. Port-A-Cath without erythema.  Lab Results:  Lab Results  Component Value Date   WBC 4.6 04/14/2023   HGB 10.4 (L) 04/14/2023   HCT 32.6 (L) 04/14/2023   MCV 89.6 04/14/2023   PLT 138 (L) 04/14/2023   NEUTROABS 2.7 04/14/2023    Imaging:  No results found.  Medications: I have reviewed the patient's current medications.  Assessment/Plan: Pancreas cancer  bloating/constipation/anorexia and weight loss/back pain EGD 01/20/2023-diffuse moderate mucosal changes characterized by congestion and erythema in the gastric antrum; a large ulcerated and fungating mass with stigmata of recent bleeding in the duodenal bulb; segmental moderate inflammation characterized by congestion and erythema found at the pylorus.  There was no evidence of dysplasia or malignancy in the duodenum biopsies.   CT abdomen/pelvis 01/28/2023-necrotic mass with peripheral enhancement involving the pancreatic body and tail measuring 10.1 x 6.3 cm; mass encasing  the splenic artery and causes splenic vein thrombosis; no evidence of vascular involvement of the celiac axis, superior mesenteric artery or veins or portal vein; necrotic peripancreatic lymph node seen along the anterior aspect of the pancreatic body measuring 2.4 x 1.9 cm; multiple left upper quadrant necrotic masses seen in the gastrosplenic ligament and adjacent to the splenic flexure of the colon. 02/06/2023 CEA and CA 19-9 elevated 02/07/2023 biopsy left abdominal soft tissue implant-metastatic moderately differentiated adenocarcinoma, MSS, tumor mutation burden 4, HRD signature negative, [id number] 02/10/2023 CTs neck and chest-no metastatic disease.  Large cystic appearing pancreatic mass and adjacent cystic/necrotic peritoneal lesions. Cycle 1 NALIRIFOX 02/18/2023 Cycle 2 NALIRIFOX 03/06/2023 Cycle 3 NALIRIFOX 03/18/2023 Cycle 4 NALIRIFOX 04/01/2023 Cycle 5 NALIRIFOX 04/14/2023, doses adjusted for weight loss Hypertension Hypersalivation following cycle 3 NALIRIFOX-likely related to irinotecan, improved with scopolamine patch  Disposition: Ralph Davis appears stable.  He has completed 4 cycles of NALIRIFOX.  Aside from the hypersalivation he is tolerating treatment well.  He notes improvement in the hypersalivation since beginning the scopolamine patch.  Plan to proceed with cycle 5 NALIRIFOX today as scheduled.  Chemotherapy doses have been adjusted for weight loss.  Restaging CTs before next office visit.  CBC and chemistry panel reviewed.  Labs adequate to proceed as above.  He continues to lose weight but reports a good appetite.  We will reconsult the [facility] dietitian.  He will return for follow-up in 2 weeks.  We are available to see him sooner if needed.   [name] ANP/GNP-BC   04/14/2023  9:03 AM

## 2023-04-14 NOTE — Patient Instructions (Signed)
 [facility] - A DEPT OF [facility]  Discharge Instructions: Thank you for choosing [facility] to provide your oncology and hematology care.   If you have a lab appointment with the Cancer Center, please go directly to the Cancer Center and check in at the registration area.   Wear comfortable clothing and clothing appropriate for easy access to any Portacath or PICC line.   We strive to give you quality time with your provider. You may need to reschedule your appointment if you arrive late (15 or more minutes).  Arriving late affects you and other patients whose appointments are after yours.  Also, if you miss three or more appointments without notifying the office, you may be dismissed from the clinic at the provider's discretion.      For prescription refill requests, have your pharmacy contact our office and allow 72 hours for refills to be completed.    Today you received the following chemotherapy and/or immunotherapy agents Irinotecan, Oxaliplatin, Leucovorin, and Adurcil.  Irinotecan Injection What is this medication? IRINOTECAN (ir in oh TEE kan) treats some types of cancer. It works by slowing down the growth of cancer cells. This medicine may be used for other purposes; ask your health care provider or pharmacist if you have questions. COMMON BRAND NAME(S): Camptosar What should I tell my care team before I take this medication? They need to know if you have any of these conditions: Dehydration Diarrhea Infection, especially a viral infection, such as chickenpox, cold sores, herpes Liver disease Low blood cell levels (white cells, red cells, and platelets) Low levels of electrolytes, such as calcium, magnesium, or potassium in your blood Recent or ongoing radiation An unusual or allergic reaction to irinotecan, other medications, foods, dyes, or preservatives If you or your partner are pregnant or trying to get  pregnant Breast-feeding How should I use this medication? This medication is injected into a vein. It is given by your care team in a hospital or clinic setting. Talk to your care team about the use of this medication in children. Special care may be needed. Overdosage: If you think you have taken too much of this medicine contact a poison control center or emergency room at once. NOTE: This medicine is only for you. Do not share this medicine with others. What if I miss a dose? Keep appointments for follow-up doses. It is important not to miss your dose. Call your care team if you are unable to keep an appointment. What may interact with this medication? Do not take this medication with any of the following: Cobicistat Itraconazole This medication may also interact with the following: Certain antibiotics, such as clarithromycin, rifampin, rifabutin Certain antivirals for HIV or AIDS Certain medications for fungal infections, such as ketoconazole, posaconazole, voriconazole Certain medications for seizures, such as carbamazepine, phenobarbital, phenytoin Gemfibrozil Nefazodone St. John's wort This list may not describe all possible interactions. Give your health care provider a list of all the medicines, herbs, non-prescription drugs, or dietary supplements you use. Also tell them if you smoke, drink alcohol, or use illegal drugs. Some items may interact with your medicine. What should I watch for while using this medication? Your condition will be monitored carefully while you are receiving this medication. You may need blood work while taking this medication. This medication may make you feel generally unwell. This is not uncommon as chemotherapy can affect healthy cells as well as cancer cells. Report any side effects. Continue  your course of treatment even though you feel ill unless your care team tells you to stop. This medication can cause serious side effects. To reduce the risk, your  care team may give you other medications to take before receiving this one. Be sure to follow the directions from your care team. This medication may affect your coordination, reaction time, or judgement. Do not drive or operate machinery until you know how this medication affects you. Sit up or stand slowly to reduce the risk of dizzy or fainting spells. Drinking alcohol with this medication can increase the risk of these side effects. This medication may increase your risk of getting an infection. Call your care team for advice if you get a fever, chills, sore throat, or other symptoms of a cold or flu. Do not treat yourself. Try to avoid being around people who are sick. Avoid taking medications that contain aspirin, acetaminophen, ibuprofen, naproxen, or ketoprofen unless instructed by your care team. These medications may hide a fever. This medication may increase your risk to bruise or bleed. Call your care team if you notice any unusual bleeding. Be careful brushing or flossing your teeth or using a toothpick because you may get an infection or bleed more easily. If you have any dental work done, tell your dentist you are receiving this medication. Talk to your care team if you or your partner are pregnant or think either of you might be pregnant. This medication can cause serious birth defects if taken during pregnancy and for 6 months after the last dose. You will need a negative pregnancy test before starting this medication. Contraception is recommended while taking this medication and for 6 months after the last dose. Your care team can help you find the option that works for you. Do not father a child while taking this medication and for 3 months after the last dose. Use a condom for contraception during this time period. Do not breastfeed while taking this medication and for 7 days after the last dose. This medication may cause infertility. Talk to your care team if you are concerned about  your fertility. What side effects may I notice from receiving this medication? Side effects that you should report to your care team as soon as possible: Allergic reactions--skin rash, itching, hives, swelling of the face, lips, tongue, or throat Dry cough, shortness of breath or trouble breathing Increased saliva or tears, increased sweating, stomach cramping, diarrhea, small pupils, unusual weakness or fatigue, slow heartbeat Infection--fever, chills, cough, sore throat, wounds that don't heal, pain or trouble when passing urine, general feeling of discomfort or being unwell Kidney injury--decrease in the amount of urine, swelling of the ankles, hands, or feet Low red blood cell level--unusual weakness or fatigue, dizziness, headache, trouble breathing Severe or prolonged diarrhea Unusual bruising or bleeding Side effects that usually do not require medical attention (report to your care team if they continue or are bothersome): Constipation Diarrhea Hair loss Loss of appetite Nausea Stomach pain This list may not describe all possible side effects. Call your doctor for medical advice about side effects. You may report side effects to FDA at 1-800-FDA-1088. Where should I keep my medication? This medication is given in a hospital or clinic. It will not be stored at home. NOTE: This sheet is a summary. It may not cover all possible information. If you have questions about this medicine, talk to your doctor, pharmacist, or health care provider.  2024 Elsevier/Gold Standard (2021-06-18 00:00:00)  Oxaliplatin Injection  What is this medication? OXALIPLATIN (ox AL i PLA tin) treats colorectal cancer. It works by slowing down the growth of cancer cells. This medicine may be used for other purposes; ask your health care provider or pharmacist if you have questions. COMMON BRAND NAME(S): Eloxatin What should I tell my care team before I take this medication? They need to know if you have any  of these conditions: Heart disease History of irregular heartbeat or rhythm Liver disease Low blood cell levels (white cells, red cells, and platelets) Lung or breathing disease, such as asthma Take medications that treat or prevent blood clots Tingling of the fingers, toes, or other nerve disorder An unusual or allergic reaction to oxaliplatin, other medications, foods, dyes, or preservatives If you or your partner are pregnant or trying to get pregnant Breast-feeding How should I use this medication? This medication is injected into a vein. It is given by your care team in a hospital or clinic setting. Talk to your care team about the use of this medication in children. Special care may be needed. Overdosage: If you think you have taken too much of this medicine contact a poison control center or emergency room at once. NOTE: This medicine is only for you. Do not share this medicine with others. What if I miss a dose? Keep appointments for follow-up doses. It is important not to miss a dose. Call your care team if you are unable to keep an appointment. What may interact with this medication? Do not take this medication with any of the following: Cisapride Dronedarone Pimozide Thioridazine This medication may also interact with the following: Aspirin and aspirin-like medications Certain medications that treat or prevent blood clots, such as warfarin, apixaban, dabigatran, and rivaroxaban Cisplatin Cyclosporine Diuretics Medications for infection, such as acyclovir, adefovir, amphotericin B, bacitracin, cidofovir, foscarnet, ganciclovir, gentamicin, pentamidine, vancomycin NSAIDs, medications for pain and inflammation, such as ibuprofen or naproxen Other medications that cause heart rhythm changes Pamidronate Zoledronic acid This list may not describe all possible interactions. Give your health care provider a list of all the medicines, herbs, non-prescription drugs, or dietary  supplements you use. Also tell them if you smoke, drink alcohol, or use illegal drugs. Some items may interact with your medicine. What should I watch for while using this medication? Your condition will be monitored carefully while you are receiving this medication. You may need blood work while taking this medication. This medication may make you feel generally unwell. This is not uncommon as chemotherapy can affect healthy cells as well as cancer cells. Report any side effects. Continue your course of treatment even though you feel ill unless your care team tells you to stop. This medication may increase your risk of getting an infection. Call your care team for advice if you get a fever, chills, sore throat, or other symptoms of a cold or flu. Do not treat yourself. Try to avoid being around people who are sick. Avoid taking medications that contain aspirin, acetaminophen, ibuprofen, naproxen, or ketoprofen unless instructed by your care team. These medications may hide a fever. Be careful brushing or flossing your teeth or using a toothpick because you may get an infection or bleed more easily. If you have any dental work done, tell your dentist you are receiving this medication. This medication can make you more sensitive to cold. Do not drink cold drinks or use ice. Cover exposed skin before coming in contact with cold temperatures or cold objects. When out in cold weather wear  warm clothing and cover your mouth and nose to warm the air that goes into your lungs. Tell your care team if you get sensitive to the cold. Talk to your care team if you or your partner are pregnant or think either of you might be pregnant. This medication can cause serious birth defects if taken during pregnancy and for 9 months after the last dose. A negative pregnancy test is required before starting this medication. A reliable form of contraception is recommended while taking this medication and for 9 months after the  last dose. Talk to your care team about effective forms of contraception. Do not father a child while taking this medication and for 6 months after the last dose. Use a condom while having sex during this time period. Do not breastfeed while taking this medication and for 3 months after the last dose. This medication may cause infertility. Talk to your care team if you are concerned about your fertility. What side effects may I notice from receiving this medication? Side effects that you should report to your care team as soon as possible: Allergic reactions--skin rash, itching, hives, swelling of the face, lips, tongue, or throat Bleeding--bloody or black, tar-like stools, vomiting blood or brown material that looks like coffee grounds, red or dark brown urine, small red or purple spots on skin, unusual bruising or bleeding Dry cough, shortness of breath or trouble breathing Heart rhythm changes--fast or irregular heartbeat, dizziness, feeling faint or lightheaded, chest pain, trouble breathing Infection--fever, chills, cough, sore throat, wounds that don't heal, pain or trouble when passing urine, general feeling of discomfort or being unwell Liver injury--right upper belly pain, loss of appetite, nausea, light-colored stool, dark yellow or brown urine, yellowing skin or eyes, unusual weakness or fatigue Low red blood cell level--unusual weakness or fatigue, dizziness, headache, trouble breathing Muscle injury--unusual weakness or fatigue, muscle pain, dark yellow or brown urine, decrease in amount of urine Pain, tingling, or numbness in the hands or feet Sudden and severe headache, confusion, change in vision, seizures, which may be signs of posterior reversible encephalopathy syndrome (PRES) Unusual bruising or bleeding Side effects that usually do not require medical attention (report to your care team if they continue or are bothersome): Diarrhea Nausea Pain, redness, or swelling with sores  inside the mouth or throat Unusual weakness or fatigue Vomiting This list may not describe all possible side effects. Call your doctor for medical advice about side effects. You may report side effects to FDA at 1-800-FDA-1088. Where should I keep my medication? This medication is given in a hospital or clinic. It will not be stored at home. NOTE: This sheet is a summary. It may not cover all possible information. If you have questions about this medicine, talk to your doctor, pharmacist, or health care provider.  2024 Elsevier/Gold Standard (2023-01-17 00:00:00)  Leucovorin Injection What is this medication? LEUCOVORIN (loo koe VOR in) prevents side effects from certain medications, such as methotrexate. It works by increasing folate levels. This helps protect healthy cells in your body. It may also be used to treat anemia caused by low levels of folate. It can also be used with fluorouracil, a type of chemotherapy, to treat colorectal cancer. It works by increasing the effects of fluorouracil in the body. This medicine may be used for other purposes; ask your health care provider or pharmacist if you have questions. What should I tell my care team before I take this medication? They need to know if you have  any of these conditions: Anemia from low levels of vitamin B12 in the blood An unusual or allergic reaction to leucovorin, folic acid, other medications, foods, dyes, or preservatives Pregnant or trying to get pregnant Breastfeeding How should I use this medication? This medication is injected into a vein or a muscle. It is given by your care team in a hospital or clinic setting. Talk to your care team about the use of this medication in children. Special care may be needed. Overdosage: If you think you have taken too much of this medicine contact a poison control center or emergency room at once. NOTE: This medicine is only for you. Do not share this medicine with others. What if I  miss a dose? Keep appointments for follow-up doses. It is important not to miss your dose. Call your care team if you are unable to keep an appointment. What may interact with this medication? Capecitabine Fluorouracil Phenobarbital Phenytoin Primidone Trimethoprim;sulfamethoxazole This list may not describe all possible interactions. Give your health care provider a list of all the medicines, herbs, non-prescription drugs, or dietary supplements you use. Also tell them if you smoke, drink alcohol, or use illegal drugs. Some items may interact with your medicine. What should I watch for while using this medication? Your condition will be monitored carefully while you are receiving this medication. This medication may increase the side effects of 5-fluorouracil. Tell your care team if you have diarrhea or mouth sores that do not get better or that get worse. What side effects may I notice from receiving this medication? Side effects that you should report to your care team as soon as possible: Allergic reactions--skin rash, itching, hives, swelling of the face, lips, tongue, or throat This list may not describe all possible side effects. Call your doctor for medical advice about side effects. You may report side effects to FDA at 1-800-FDA-1088. Where should I keep my medication? This medication is given in a hospital or clinic. It will not be stored at home. NOTE: This sheet is a summary. It may not cover all possible information. If you have questions about this medicine, talk to your doctor, pharmacist, or health care provider.  2024 Elsevier/Gold Standard (2021-07-10 00:00:00)  Fluorouracil Injection What is this medication? FLUOROURACIL (flure oh YOOR a sil) treats some types of cancer. It works by slowing down the growth of cancer cells. This medicine may be used for other purposes; ask your health care provider or pharmacist if you have questions. COMMON BRAND NAME(S): Adrucil What  should I tell my care team before I take this medication? They need to know if you have any of these conditions: Blood disorders Dihydropyrimidine dehydrogenase (DPD) deficiency Infection, such as chickenpox, cold sores, herpes Kidney disease Liver disease Poor nutrition Recent or ongoing radiation therapy An unusual or allergic reaction to fluorouracil, other medications, foods, dyes, or preservatives If you or your partner are pregnant or trying to get pregnant Breast-feeding How should I use this medication? This medication is injected into a vein. It is administered by your care team in a hospital or clinic setting. Talk to your care team about the use of this medication in children. Special care may be needed. Overdosage: If you think you have taken too much of this medicine contact a poison control center or emergency room at once. NOTE: This medicine is only for you. Do not share this medicine with others. What if I miss a dose? Keep appointments for follow-up doses. It is important not  to miss your dose. Call your care team if you are unable to keep an appointment. What may interact with this medication? Do not take this medication with any of the following: Live virus vaccines This medication may also interact with the following: Medications that treat or prevent blood clots, such as warfarin, enoxaparin, dalteparin This list may not describe all possible interactions. Give your health care provider a list of all the medicines, herbs, non-prescription drugs, or dietary supplements you use. Also tell them if you smoke, drink alcohol, or use illegal drugs. Some items may interact with your medicine. What should I watch for while using this medication? Your condition will be monitored carefully while you are receiving this medication. This medication may make you feel generally unwell. This is not uncommon as chemotherapy can affect healthy cells as well as cancer cells. Report any  side effects. Continue your course of treatment even though you feel ill unless your care team tells you to stop. In some cases, you may be given additional medications to help with side effects. Follow all directions for their use. This medication may increase your risk of getting an infection. Call your care team for advice if you get a fever, chills, sore throat, or other symptoms of a cold or flu. Do not treat yourself. Try to avoid being around people who are sick. This medication may increase your risk to bruise or bleed. Call your care team if you notice any unusual bleeding. Be careful brushing or flossing your teeth or using a toothpick because you may get an infection or bleed more easily. If you have any dental work done, tell your dentist you are receiving this medication. Avoid taking medications that contain aspirin, acetaminophen, ibuprofen, naproxen, or ketoprofen unless instructed by your care team. These medications may hide a fever. Do not treat diarrhea with over the counter products. Contact your care team if you have diarrhea that lasts more than 2 days or if it is severe and watery. This medication can make you more sensitive to the sun. Keep out of the sun. If you cannot avoid being in the sun, wear protective clothing and sunscreen. Do not use sun lamps, tanning beds, or tanning booths. Talk to your care team if you or your partner wish to become pregnant or think you might be pregnant. This medication can cause serious birth defects if taken during pregnancy and for 3 months after the last dose. A reliable form of contraception is recommended while taking this medication and for 3 months after the last dose. Talk to your care team about effective forms of contraception. Do not father a child while taking this medication and for 3 months after the last dose. Use a condom while having sex during this time period. Do not breastfeed while taking this medication. This medication may  cause infertility. Talk to your care team if you are concerned about your fertility. What side effects may I notice from receiving this medication? Side effects that you should report to your care team as soon as possible: Allergic reactions--skin rash, itching, hives, swelling of the face, lips, tongue, or throat Heart attack--pain or tightness in the chest, shoulders, arms, or jaw, nausea, shortness of breath, cold or clammy skin, feeling faint or lightheaded Heart failure--shortness of breath, swelling of the ankles, feet, or hands, sudden weight gain, unusual weakness or fatigue Heart rhythm changes--fast or irregular heartbeat, dizziness, feeling faint or lightheaded, chest pain, trouble breathing High ammonia level--unusual weakness or fatigue, confusion,  loss of appetite, nausea, vomiting, seizures Infection--fever, chills, cough, sore throat, wounds that don't heal, pain or trouble when passing urine, general feeling of discomfort or being unwell Low red blood cell level--unusual weakness or fatigue, dizziness, headache, trouble breathing Pain, tingling, or numbness in the hands or feet, muscle weakness, change in vision, confusion or trouble speaking, loss of balance or coordination, trouble walking, seizures Redness, swelling, and blistering of the skin over hands and feet Severe or prolonged diarrhea Unusual bruising or bleeding Side effects that usually do not require medical attention (report to your care team if they continue or are bothersome): Dry skin Headache Increased tears Nausea Pain, redness, or swelling with sores inside the mouth or throat Sensitivity to light Vomiting This list may not describe all possible side effects. Call your doctor for medical advice about side effects. You may report side effects to FDA at 1-800-FDA-1088. Where should I keep my medication? This medication is given in a hospital or clinic. It will not be stored at home. NOTE: This sheet is a  summary. It may not cover all possible information. If you have questions about this medicine, talk to your doctor, pharmacist, or health care provider.  2024 Elsevier/Gold Standard (2021-06-12 00:00:00)        To help prevent nausea and vomiting after your treatment, we encourage you to take your nausea medication as directed.  BELOW ARE SYMPTOMS THAT SHOULD BE REPORTED IMMEDIATELY: *FEVER GREATER THAN 100.4 F (38 C) OR HIGHER *CHILLS OR SWEATING *NAUSEA AND VOMITING THAT IS NOT CONTROLLED WITH YOUR NAUSEA MEDICATION *UNUSUAL SHORTNESS OF BREATH *UNUSUAL BRUISING OR BLEEDING *URINARY PROBLEMS (pain or burning when urinating, or frequent urination) *BOWEL PROBLEMS (unusual diarrhea, constipation, pain near the anus) TENDERNESS IN MOUTH AND THROAT WITH OR WITHOUT PRESENCE OF ULCERS (sore throat, sores in mouth, or a toothache) UNUSUAL RASH, SWELLING OR PAIN  UNUSUAL VAGINAL DISCHARGE OR ITCHING   Items with * indicate a potential emergency and should be followed up as soon as possible or go to the Emergency Department if any problems should occur.  Please show the CHEMOTHERAPY ALERT CARD or IMMUNOTHERAPY ALERT CARD at check-in to the Emergency Department and triage nurse.  Should you have questions after your visit or need to cancel or reschedule your appointment, please contact [facility] - A DEPT OF [facility]  Dept: [phone]  and follow the prompts.  Office hours are 8:00 a.m. to 4:30 p.m. Monday - Friday. Please note that voicemails left after 4:00 p.m. may not be returned until the following business day.  We are closed weekends and major holidays. You have access to a nurse at all times for urgent questions. Please call the main number to the clinic Dept: [phone] and follow the prompts.   For any non-urgent questions, you may also contact your provider using MyChart. We now offer e-Visits for anyone 78 and older to request care online for  non-urgent symptoms. For details visit [email or web address].   Also download the MyChart app! Go to the app store, search "MyChart", open the app, select [facility], and log in with your MyChart username and password.

## 2023-04-14 NOTE — Progress Notes (Signed)
 Patient seen by [name] NP today  Vitals are within treatment parameters:Yes   Labs are within treatment parameters: Yes   Treatment plan has been signed: Yes   Per physician team, Patient is ready for treatment. Please note the following modifications:dose reduced due to weight loss

## 2023-04-14 NOTE — Progress Notes (Signed)
 Patient presents today for chemotherapy infusion. Patient is in satisfactory condition with no new complaints voiced.  Vital signs are stable.  Labs reviewed by [name] NP during the office visit and all labs are within treatment parameters.  We will proceed with treatment per MD orders.   Patient tolerated treatment well with no complaints voiced.  Patient left ambulatory in stable condition.  Vital signs stable at discharge.  Follow up as scheduled.

## 2023-04-15 ENCOUNTER — Other Ambulatory Visit: Payer: Self-pay

## 2023-04-15 ENCOUNTER — Ambulatory Visit: Payer: Medicare Other

## 2023-04-15 LAB — CANCER ANTIGEN 19-9: CA 19-9: 906 U/mL — ABNORMAL HIGH (ref 0–35)

## 2023-04-16 ENCOUNTER — Inpatient Hospital Stay: Payer: Medicare Other

## 2023-04-16 VITALS — BP 122/74 | HR 71 | Temp 98.1°F | Resp 18

## 2023-04-16 DIAGNOSIS — Z452 Encounter for adjustment and management of vascular access device: Secondary | ICD-10-CM | POA: Diagnosis not present

## 2023-04-16 DIAGNOSIS — K117 Disturbances of salivary secretion: Secondary | ICD-10-CM | POA: Diagnosis not present

## 2023-04-16 DIAGNOSIS — C252 Malignant neoplasm of tail of pancreas: Secondary | ICD-10-CM

## 2023-04-16 DIAGNOSIS — E860 Dehydration: Secondary | ICD-10-CM | POA: Diagnosis not present

## 2023-04-16 DIAGNOSIS — C258 Malignant neoplasm of overlapping sites of pancreas: Secondary | ICD-10-CM | POA: Diagnosis not present

## 2023-04-16 DIAGNOSIS — Z5111 Encounter for antineoplastic chemotherapy: Secondary | ICD-10-CM | POA: Diagnosis not present

## 2023-04-16 DIAGNOSIS — E876 Hypokalemia: Secondary | ICD-10-CM | POA: Diagnosis not present

## 2023-04-16 DIAGNOSIS — Z86718 Personal history of other venous thrombosis and embolism: Secondary | ICD-10-CM | POA: Diagnosis not present

## 2023-04-16 DIAGNOSIS — I10 Essential (primary) hypertension: Secondary | ICD-10-CM | POA: Diagnosis not present

## 2023-04-16 DIAGNOSIS — D696 Thrombocytopenia, unspecified: Secondary | ICD-10-CM | POA: Diagnosis not present

## 2023-04-16 MED ORDER — SODIUM CHLORIDE 0.9 % IV SOLN: INTRAVENOUS | Status: AC

## 2023-04-16 MED ORDER — SODIUM CHLORIDE 0.9% FLUSH
10.0000 mL | INTRAVENOUS | Status: DC | PRN
Start: 2023-04-16 — End: 2023-04-16
  Administered 2023-04-16: 10 mL

## 2023-04-16 MED ORDER — HEPARIN SOD (PORK) LOCK FLUSH 100 UNIT/ML IV SOLN
500.0000 [IU] | Freq: Once | INTRAVENOUS | Status: AC | PRN
  Administered 2023-04-16: 500 [IU]

## 2023-04-16 NOTE — Patient Instructions (Signed)
 Implanted [facility] Guide An implanted port is a device that is placed under the skin. It is usually placed in the chest. The device may vary based on the need. Implanted ports can be used to give IV medicine, to take blood, or to give fluids. You may have an implanted port if: You need IV medicine that would be irritating to the small veins in your hands or arms. You need IV medicines, such as chemotherapy, for a long period of time. You need IV nutrition for a long period of time. You may have fewer limitations when using a port than you would if you used other types of long-term IVs. You will also likely be able to return to normal activities after your incision heals. An implanted port has two main parts: Reservoir. The reservoir is the part where a needle is inserted to give medicines or draw blood. The reservoir is round. After the port is placed, it appears as a small, raised area under your skin. Catheter. The catheter is a small, thin tube that connects the reservoir to a vein. Medicine that is inserted into the reservoir goes into the catheter and then into the vein. How is my port accessed? To access your port: A numbing cream may be placed on the skin over the port site. Your health care provider will put on a mask and sterile gloves. The skin over your port will be cleaned carefully with a germ-killing soap and allowed to dry. Your health care provider will gently pinch the port and insert a needle into it. Your health care provider will check for a blood return to make sure the port is in the vein and is still working (patent). If your port needs to remain accessed to get medicine continuously (constant infusion), your health care provider will place a clear bandage (dressing) over the needle site. The dressing and needle will need to be changed every week, or as told by your health care provider. What is flushing? Flushing helps keep the port working. Follow instructions from your  health care provider about how and when to flush the port. Ports are usually flushed with saline solution or a medicine called heparin. The need for flushing will depend on how the port is used: If the port is only used from time to time to give medicines or draw blood, the port may need to be flushed: Before and after medicines have been given. Before and after blood has been drawn. As part of routine maintenance. Flushing may be recommended every 4-6 weeks. If a constant infusion is running, the port may not need to be flushed. Throw away any syringes in a disposal container that is meant for sharp items (sharps container). You can buy a sharps container from a pharmacy, or you can make one by using an empty hard plastic bottle with a cover. How long will my port stay implanted? The port can stay in for as long as your health care provider thinks it is needed. When it is time for the port to come out, a surgery will be done to remove it. The surgery will be similar to the procedure that was done to put the port in. Follow these instructions at home: Caring for your port and port site Flush your port as told by your health care provider. If you need an infusion over several days, follow instructions from your health care provider about how to take care of your port site. Make sure you: Change your  dressing as told by your health care provider. Wash your hands with soap and water for at least 20 seconds before and after you change your dressing. If soap and water are not available, use alcohol-based hand sanitizer. Place any used dressings or infusion bags into a plastic bag. Throw that bag in the trash. Keep the dressing that covers the needle clean and dry. Do not get it wet. Do not use scissors or sharp objects near the infusion tubing. Keep any external tubes clamped, unless they are being used. Check your port site every day for signs of infection. Check for: Redness, swelling, or  pain. Fluid or blood. Warmth. Pus or a bad smell. Protect the skin around the port site. Avoid wearing bra straps that rub or irritate the site. Protect the skin around your port from seat belts. Place a soft pad over your chest if needed. Bathe or shower as told by your health care provider. The site may get wet as long as you are not actively receiving an infusion. General instructions  Return to your normal activities as told by your health care provider. Ask your health care provider what activities are safe for you. Carry a medical alert card or wear a medical alert bracelet at all times. This will let health care providers know that you have an implanted port in case of an emergency. Where to find more information American Cancer Society: www.cancer.org American Society of Clinical Oncology: www.cancer.net Contact a health care provider if: You have a fever or chills. You have redness, swelling, or pain at the port site. You have fluid or blood coming from your port site. Your incision feels warm to the touch. You have pus or a bad smell coming from the port site. Summary Implanted ports are usually placed in the chest for long-term IV access. Follow instructions from your health care provider about flushing the port and changing bandages (dressings). Take care of the area around your port by avoiding clothing that puts pressure on the area, and by watching for signs of infection. Protect the skin around your port from seat belts. Place a soft pad over your chest if needed. Contact a health care provider if you have a fever or you have redness, swelling, pain, fluid, or a bad smell at the port site. This information is not intended to replace advice given to you by your health care provider. Make sure you discuss any questions you have with your health care provider.  Document Revised: 08/08/2020 Document Reviewed: 08/08/2020 Elsevier Patient Education  2024 Elsevier  Inc.Rehydration, Older Adult  Rehydration is the replacement of fluids, salts, and minerals in the body (electrolytes) that are lost during dehydration. Dehydration is when there is not enough water or other fluids in the body. This happens when you lose more fluids than you take in. People who are age 63 or older have a higher risk of dehydration than younger adults. This is because in older age, the body: Is less able to maintain the right amount of water. Does not respond to temperature changes as well. Does not get a sense of thirst as easily or quickly. Other causes include: Not drinking enough fluids. This can occur when you are ill, when you forget to drink, or when you are doing activities that require a lot of energy, especially in hot weather. Conditions that cause loss of water or other fluids. These include diarrhea, vomiting, sweating, or urinating a lot. Other illnesses, such as fever or infection. Certain  medicines, such as those that remove excess fluid from the body (diuretics). Symptoms of mild or moderate dehydration may include thirst, dry lips and mouth, and dizziness. Symptoms of severe dehydration may include increased heart rate, confusion, fainting, and not urinating. In severe cases, you may need to get fluids through an IV at the hospital. For mild or moderate cases, you can usually rehydrate at home by drinking certain fluids as told by your health care provider. What are the risks? Rehydration is usually safe. Taking in too much fluid (overhydration) can be a problem but is rare. Overhydration can cause an imbalance of electrolytes in the body, kidney failure, fluid in the lungs, or a decrease in salt (sodium) levels in the body. Supplies needed: You will need an oral rehydration solution (ORS) if your health care provider tells you to use one. This is a drink to treat dehydration. It can be found in pharmacies and retail stores. How to rehydrate Fluids Follow  instructions from your health care provider about what to drink. The kind of fluid and the amount you should drink depend on your condition. In general, you should choose drinks that you prefer. If told by your health care provider, drink an ORS. Make an ORS by following instructions on the package. Start by drinking small amounts, about  cup (120 mL) every 5-10 minutes. Slowly increase how much you drink until you have taken in the amount recommended by your health care provider. Drink enough clear fluids to keep your urine pale yellow. If you were told to drink an ORS, finish it first, then start slowly drinking other clear fluids. Drink fluids such as: Water. This includes sparkling and flavored water. Drinking only water can lead to having too little sodium in your body (hyponatremia). Follow the advice of your health care provider. Water from ice chips you suck on. Fruit juice with water added to it(diluted). Sports drinks. Hot or cold herbal teas. Broth-based soups. Coffee. Milk or milk products. Food Follow instructions from your health care provider about what to eat while you rehydrate. Your health care provider may recommend that you slowly begin eating regular foods in small amounts. Eat foods that contain a healthy balance of electrolytes, such as bananas, oranges, potatoes, tomatoes, and spinach. Avoid foods that are greasy or contain a lot of sugar. In some cases, you may get nutrition through a feeding tube that is passed through your nose and into your stomach (nasogastric tube, or NG tube). This may be done if you have uncontrolled vomiting or diarrhea. Drinks to avoid  Certain drinks may make dehydration worse. While you rehydrate, avoid drinking alcohol. How to tell if you are recovering from dehydration You may be getting better if: You are urinating more often than before you started rehydrating. Your urine is pale yellow. Your energy level improves. You vomit less  often. You have diarrhea less often. Your appetite improves or returns to normal. You feel less dizzy or light-headed. Your skin tone and color start to look more normal. Follow these instructions at home: Take over-the-counter and prescription medicines only as told by your health care provider. Do not take sodium tablets. Doing this can lead to having too much sodium in your body (hypernatremia). Contact a health care provider if: You continue to have symptoms of mild or moderate dehydration, such as: Thirst. Dry lips. Slightly dry mouth. Dizziness. Dark urine or less urine than usual. Muscle cramps. You continue to vomit or have diarrhea. Get help right away  if: You have symptoms of dehydration that get worse. You have a fever. You have a severe headache. You have been vomiting and have problems, such as: Your vomiting gets worse. Your vomit includes blood or green matter (bile). You cannot eat or drink without vomiting. You have problems with urination or bowel movements, such as: Diarrhea that gets worse. Blood in your stool (feces). This may cause stool to look black and tarry. Not urinating, or urinating only a small amount of very dark urine, within 6-8 hours. You have trouble breathing. You have symptoms that get worse with treatment. These symptoms may be an emergency. Get help right away. Call 911. Do not wait to see if the symptoms will go away. Do not drive yourself to the hospital. This information is not intended to replace advice given to you by your health care provider. Make sure you discuss any questions you have with your health care provider. Document Revised: 06/20/2021 Document Reviewed: 06/18/2021 Elsevier Patient Education  2024 [employer]

## 2023-04-18 ENCOUNTER — Telehealth: Payer: Self-pay | Admitting: *Deleted

## 2023-04-18 NOTE — Telephone Encounter (Signed)
 Mrs. Schiffman left VM that Mr. Klingensmith is gagging and not keeping anything down and thinks he needs a G-tube. Called Mr. Gergen and he reports continued thick mucous in sinuses and down his throat that gets so thick he has trouble swallowing and gags and then vomits/spits up. Reports water stays down OK, but other liquids do not. Can swallow pudding at times, but other solids do not stay down. Does not really feel nausea and there is no pain with his swallowing. No fever or breathing difficulty. Drinks ~ 60 oz water/day w/occasional reflux/vomiting. He is wearing his scopolamnie 1.5 mg patch and changing as ordered. He does not want IVF today, saying he felt worse after he was given fluids on 2/26 and he does not want a feeding tube (that was his wife's idea). Bowels are moving small amounts daily and does not feel constipated.

## 2023-04-18 NOTE — Telephone Encounter (Signed)
 LVM for Ralph Davis that Dr. Truett Perna has nothing to add to current regimen for his excess oral secretions. Continue scoplamine, push water and maybe Gatoraid and try small amounts of pudding or whatever soft food will stay down. If he gets dehydrated over weekend will need to go to ER. Call back Monday if condition worsens.

## 2023-04-21 ENCOUNTER — Encounter: Payer: Self-pay | Admitting: *Deleted

## 2023-04-21 NOTE — Progress Notes (Signed)
 Received Advanced Directive from [employer]. Forwarded to HIM to be scanned in system

## 2023-04-22 ENCOUNTER — Other Ambulatory Visit: Payer: Self-pay | Admitting: Oncology

## 2023-04-22 DIAGNOSIS — C252 Malignant neoplasm of tail of pancreas: Secondary | ICD-10-CM

## 2023-04-24 ENCOUNTER — Telehealth: Payer: Self-pay | Admitting: *Deleted

## 2023-04-24 NOTE — Telephone Encounter (Signed)
 Ralph Davis called requesting to cancel chemo on 3/11. Want to have visit with MD to review scan before "jumping into chemotherapy again".  Called her back and left VM that they are scheduled to see MD on 3/11 before chemo and we can cancel the chemo appointment that day if not indicated. If canceled today and still needed, he would lose his spot and may not get treated that week due infusion schedule.

## 2023-04-28 ENCOUNTER — Ambulatory Visit (HOSPITAL_BASED_OUTPATIENT_CLINIC_OR_DEPARTMENT_OTHER)
Admission: RE | Admit: 2023-04-28 | Discharge: 2023-04-28 | Disposition: A | Discharge status: Home / Self Care | Payer: Medicare Other | Source: Ambulatory Visit | Attending: Nurse Practitioner | Admitting: Nurse Practitioner

## 2023-04-28 DIAGNOSIS — C786 Secondary malignant neoplasm of retroperitoneum and peritoneum: Secondary | ICD-10-CM | POA: Diagnosis not present

## 2023-04-28 DIAGNOSIS — D735 Infarction of spleen: Secondary | ICD-10-CM | POA: Diagnosis not present

## 2023-04-28 DIAGNOSIS — C252 Malignant neoplasm of tail of pancreas: Secondary | ICD-10-CM | POA: Insufficient documentation

## 2023-04-28 DIAGNOSIS — C259 Malignant neoplasm of pancreas, unspecified: Secondary | ICD-10-CM | POA: Diagnosis not present

## 2023-04-28 DIAGNOSIS — N200 Calculus of kidney: Secondary | ICD-10-CM | POA: Diagnosis not present

## 2023-04-28 MED ORDER — IOHEXOL 300 MG/ML  SOLN
100.0000 mL | Freq: Once | INTRAMUSCULAR | Status: AC | PRN
  Administered 2023-04-28: 100 mL via INTRAVENOUS

## 2023-04-29 ENCOUNTER — Encounter: Payer: Self-pay | Admitting: Oncology

## 2023-04-29 ENCOUNTER — Inpatient Hospital Stay: Payer: Medicare Other | Attending: Nurse Practitioner

## 2023-04-29 ENCOUNTER — Inpatient Hospital Stay: Payer: Medicare Other

## 2023-04-29 ENCOUNTER — Telehealth: Payer: Self-pay | Admitting: *Deleted

## 2023-04-29 ENCOUNTER — Inpatient Hospital Stay: Payer: Medicare Other | Admitting: Oncology

## 2023-04-29 VITALS — BP 121/69 | HR 96 | Temp 98.1°F | Resp 18 | Ht 68.0 in | Wt 142.9 lb

## 2023-04-29 DIAGNOSIS — C252 Malignant neoplasm of tail of pancreas: Secondary | ICD-10-CM | POA: Diagnosis not present

## 2023-04-29 DIAGNOSIS — I10 Essential (primary) hypertension: Secondary | ICD-10-CM | POA: Diagnosis not present

## 2023-04-29 DIAGNOSIS — D709 Neutropenia, unspecified: Secondary | ICD-10-CM | POA: Diagnosis not present

## 2023-04-29 DIAGNOSIS — K117 Disturbances of salivary secretion: Secondary | ICD-10-CM | POA: Insufficient documentation

## 2023-04-29 DIAGNOSIS — C258 Malignant neoplasm of overlapping sites of pancreas: Secondary | ICD-10-CM | POA: Diagnosis not present

## 2023-04-29 DIAGNOSIS — Z5111 Encounter for antineoplastic chemotherapy: Secondary | ICD-10-CM | POA: Diagnosis not present

## 2023-04-29 DIAGNOSIS — Z452 Encounter for adjustment and management of vascular access device: Secondary | ICD-10-CM | POA: Insufficient documentation

## 2023-04-29 DIAGNOSIS — R112 Nausea with vomiting, unspecified: Secondary | ICD-10-CM | POA: Insufficient documentation

## 2023-04-29 DIAGNOSIS — Z95828 Presence of other vascular implants and grafts: Secondary | ICD-10-CM

## 2023-04-29 LAB — CBC WITH DIFFERENTIAL (CANCER CENTER ONLY)
Abs Immature Granulocytes: 0 10*3/uL (ref 0.00–0.07)
Basophils Absolute: 0 10*3/uL (ref 0.0–0.1)
Basophils Relative: 1 %
Eosinophils Absolute: 0.1 10*3/uL (ref 0.0–0.5)
Eosinophils Relative: 3 %
HCT: 25.1 % — ABNORMAL LOW (ref 39.0–52.0)
Hemoglobin: 8 g/dL — ABNORMAL LOW (ref 13.0–17.0)
Immature Granulocytes: 0 %
Lymphocytes Relative: 53 %
Lymphs Abs: 1.4 10*3/uL (ref 0.7–4.0)
MCH: 29.3 pg (ref 26.0–34.0)
MCHC: 31.9 g/dL (ref 30.0–36.0)
MCV: 91.9 fL (ref 80.0–100.0)
Monocytes Absolute: 0.3 10*3/uL (ref 0.1–1.0)
Monocytes Relative: 10 %
Neutro Abs: 0.9 10*3/uL — ABNORMAL LOW (ref 1.7–7.7)
Neutrophils Relative %: 33 %
Platelet Count: 133 10*3/uL — ABNORMAL LOW (ref 150–400)
RBC: 2.73 MIL/uL — ABNORMAL LOW (ref 4.22–5.81)
RDW: 21.5 % — ABNORMAL HIGH (ref 11.5–15.5)
WBC Count: 2.7 10*3/uL — ABNORMAL LOW (ref 4.0–10.5)
nRBC: 0 % (ref 0.0–0.2)

## 2023-04-29 LAB — CMP (CANCER CENTER ONLY)
ALT: 8 U/L (ref 0–44)
AST: 10 U/L — ABNORMAL LOW (ref 15–41)
Albumin: 3.4 g/dL — ABNORMAL LOW (ref 3.5–5.0)
Alkaline Phosphatase: 64 U/L (ref 38–126)
Anion gap: 10 (ref 5–15)
BUN: 12 mg/dL (ref 8–23)
CO2: 24 mmol/L (ref 22–32)
Calcium: 8.6 mg/dL — ABNORMAL LOW (ref 8.9–10.3)
Chloride: 107 mmol/L (ref 98–111)
Creatinine: 0.51 mg/dL — ABNORMAL LOW (ref 0.61–1.24)
GFR, Estimated: >60 mL/min (ref >60)
Glucose, Bld: 132 mg/dL — ABNORMAL HIGH (ref 70–99)
Potassium: 3.2 mmol/L — ABNORMAL LOW (ref 3.5–5.1)
Sodium: 141 mmol/L (ref 135–145)
Total Bilirubin: 0.3 mg/dL (ref 0.0–1.2)
Total Protein: 5.9 g/dL — ABNORMAL LOW (ref 6.5–8.1)

## 2023-04-29 MED ORDER — SODIUM CHLORIDE 0.9% FLUSH
10.0000 mL | INTRAVENOUS | Status: DC | PRN
  Administered 2023-04-29: 10 mL via INTRAVENOUS

## 2023-04-29 MED ORDER — HEPARIN SOD (PORK) LOCK FLUSH 100 UNIT/ML IV SOLN
500.0000 [IU] | Freq: Once | INTRAVENOUS | Status: AC
  Administered 2023-04-29: 500 [IU] via INTRAVENOUS

## 2023-04-29 MED ORDER — PANTOPRAZOLE SODIUM 40 MG PO TBEC: 40.0000 mg | DELAYED_RELEASE_TABLET | Freq: Every day | ORAL | 3 refills | Status: DC

## 2023-04-29 NOTE — Telephone Encounter (Signed)
 Per Dr. Truett Perna: CT scan shows decrease in size of pancreatic mass and peritoneal implant. 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 Dr. Truett Perna has reached out to pancreas surgeon to review scan for possible surgical intervention. LVM for Mr. Foye Deer to call office for results.

## 2023-04-29 NOTE — Progress Notes (Signed)
  Cancer Center OFFICE PROGRESS NOTE   Diagnosis: Pancreas cancer  INTERVAL HISTORY:   Ralph Davis completed another cycle of NALIRIFOX on 2 on 04/14/2023.  He had nausea for several days following chemotherapy.  He had episodes of emesis.  His chief complaint is thick "phlegm "in his throat.  He has cold sensitivity.  No peripheral numbness.  No diarrhea.  He reports a dry mouth.  Abdominal pain remains improved.  He has developed pain at the mid back.  The pain is not severe.  Objective:  Vital signs in last 24 hours:  Blood pressure 121/69, pulse 96, temperature 98.1 F (36.7 C), temperature source Temporal, resp. rate 18, height 5\' 8"  (1.727 m), weight 142 lb 14.4 oz (64.8 kg), SpO2 100%.    HEENT: No thrush or ulcers Resp: Lungs clear bilaterally Cardio: Regular rate and rhythm GI: No mass, nontender, no hepatosplenomegaly Vascular: No leg edema   Lab Results:  Lab Results  Component Value Date   WBC 2.7 (L) 04/29/2023   HGB 8.0 (L) 04/29/2023   HCT 25.1 (L) 04/29/2023   MCV 91.9 04/29/2023   PLT 133 (L) 04/29/2023   NEUTROABS 0.9 (L) 04/29/2023    CMP  Lab Results  Component Value Date   NA 138 04/14/2023   K 3.5 04/14/2023   CL 105 04/14/2023   CO2 26 04/14/2023   GLUCOSE 162 (H) 04/14/2023   BUN 11 04/14/2023   CREATININE 0.57 (L) 04/14/2023   CALCIUM 9.1 04/14/2023   PROT 6.5 04/14/2023   ALBUMIN 3.7 04/14/2023   AST 9 (L) 04/14/2023   ALT 9 04/14/2023   ALKPHOS 81 04/14/2023   BILITOT 0.5 04/14/2023   GFRNONAA >60 04/14/2023   GFRAA >60 05/08/2019    Lab Results  Component Value Date   CEA 75.60 (H) 02/06/2023   ZOX096 906 (H) 04/14/2023     Medications: I have reviewed the patient's current medications.   Assessment/Plan: Pancreas cancer  bloating/constipation/anorexia and weight loss/back pain EGD 01/20/2023-diffuse moderate mucosal changes characterized by congestion and erythema in the gastric antrum; a large ulcerated  and fungating mass with stigmata of recent bleeding in the duodenal bulb; segmental moderate inflammation characterized by congestion and erythema found at the pylorus.  There was no evidence of dysplasia or malignancy in the duodenum biopsies.   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 02/06/2023 CEA and CA 19-9 elevated 02/07/2023 biopsy left abdominal soft tissue implant-metastatic moderately differentiated adenocarcinoma, MSS, tumor mutation burden 4, HRD signature negative, [id number] 02/10/2023 CTs neck and chest-no metastatic disease.  Large cystic appearing pancreatic mass and adjacent cystic/necrotic peritoneal lesions. Cycle 1 NALIRIFOX 02/18/2023 Cycle 2 NALIRIFOX 03/06/2023 Cycle 3 NALIRIFOX 03/18/2023 Cycle 4 NALIRIFOX 04/01/2023 Cycle 5 NALIRIFOX 04/14/2023, doses adjusted for weight loss CT abdomen/pelvis 04/28/2023: Mild decrease in size pancreas body/tail mass and peritoneal implants.  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  The restaging CTs revealed improvement in the pancreas mass and peritoneal lesions.  The CA 19-9 is lower.  Abdominal pain has improved.  The plan is to continue chemotherapy. Chemotherapy will be held today due to  neutropenia.  Irinotecan and oxaliplatin will be dose reduced within the next cycle.  Emend will be added for delayed nausea.  He will begin a trial of guaifenesin for the thick secretions and Protonix for reflux symptoms.  I will communicate with the surgical service regarding the fistula between the stomach and pancreas mass.  Thornton Papas, MD  04/29/2023  8:50 AM

## 2023-04-29 NOTE — Progress Notes (Signed)
 Patient seen by Dr. Thornton Papas today  Vitals are within treatment parameters:Yes   Labs are within treatment parameters: No (Please specify and give further instructions.)  ANC 0.9  Treatment plan has been signed: Yes   Per physician team, Patient will not be receiving treatment today.  Will add Emend to next treatment in 2 weeks

## 2023-04-30 ENCOUNTER — Telehealth: Payer: Self-pay | Admitting: *Deleted

## 2023-04-30 LAB — CANCER ANTIGEN 19-9: CA 19-9: 419 U/mL — ABNORMAL HIGH (ref 0–35)

## 2023-04-30 NOTE — Telephone Encounter (Signed)
 Notified Mrs. Veley of CT results per Dr. Truett Perna. He has reached out to pancreas surgeon to see if there are any options [id number]. Does not think this is currently causing any problem.

## 2023-04-30 NOTE — Telephone Encounter (Signed)
-----   Message from Thornton Papas sent at 04/29/2023  2:40 PM EDT ----- Please call patient, the CT confirms a decrease in the pancreas mass and peritoneal nodules.  There is a new fistulous communication between the pancreas mass and stomach, likely not causing symptoms and there is likely no surgical option.  I sent a message to surgery to get their opinion.  Begin the trial of Protonix and guaifenesin as scheduled.

## 2023-05-02 ENCOUNTER — Encounter: Payer: Self-pay | Admitting: *Deleted

## 2023-05-05 ENCOUNTER — Telehealth: Payer: Self-pay | Admitting: *Deleted

## 2023-05-05 DIAGNOSIS — C252 Malignant neoplasm of tail of pancreas: Secondary | ICD-10-CM

## 2023-05-05 DIAGNOSIS — E876 Hypokalemia: Secondary | ICD-10-CM

## 2023-05-05 MED ORDER — POTASSIUM CHLORIDE CRYS ER 10 MEQ PO TBCR: 10.0000 meq | EXTENDED_RELEASE_TABLET | Freq: Three times a day (TID) | ORAL | 1 refills | Status: DC

## 2023-05-05 NOTE — Telephone Encounter (Signed)
 Confirmed w/[name] that he was taking KCl 10 meq bid. Per [name], NP: Need to increase to tid. He agrees and new script sent.

## 2023-05-11 ENCOUNTER — Other Ambulatory Visit: Payer: Self-pay | Admitting: Oncology

## 2023-05-11 DIAGNOSIS — C252 Malignant neoplasm of tail of pancreas: Secondary | ICD-10-CM

## 2023-05-13 ENCOUNTER — Inpatient Hospital Stay: Payer: Medicare Other

## 2023-05-13 ENCOUNTER — Inpatient Hospital Stay (HOSPITAL_BASED_OUTPATIENT_CLINIC_OR_DEPARTMENT_OTHER): Payer: Medicare Other | Admitting: Nurse Practitioner

## 2023-05-13 ENCOUNTER — Encounter: Payer: Self-pay | Admitting: Nurse Practitioner

## 2023-05-13 VITALS — BP 123/76 | HR 77 | Temp 98.1°F | Resp 18 | Ht 68.0 in | Wt 149.5 lb

## 2023-05-13 DIAGNOSIS — Z452 Encounter for adjustment and management of vascular access device: Secondary | ICD-10-CM | POA: Diagnosis not present

## 2023-05-13 DIAGNOSIS — C252 Malignant neoplasm of tail of pancreas: Secondary | ICD-10-CM

## 2023-05-13 DIAGNOSIS — Z5111 Encounter for antineoplastic chemotherapy: Secondary | ICD-10-CM | POA: Diagnosis not present

## 2023-05-13 DIAGNOSIS — R112 Nausea with vomiting, unspecified: Secondary | ICD-10-CM | POA: Diagnosis not present

## 2023-05-13 DIAGNOSIS — C258 Malignant neoplasm of overlapping sites of pancreas: Secondary | ICD-10-CM | POA: Diagnosis not present

## 2023-05-13 DIAGNOSIS — D709 Neutropenia, unspecified: Secondary | ICD-10-CM | POA: Diagnosis not present

## 2023-05-13 DIAGNOSIS — K117 Disturbances of salivary secretion: Secondary | ICD-10-CM | POA: Diagnosis not present

## 2023-05-13 DIAGNOSIS — I10 Essential (primary) hypertension: Secondary | ICD-10-CM | POA: Diagnosis not present

## 2023-05-13 LAB — CBC WITH DIFFERENTIAL (CANCER CENTER ONLY)
Abs Immature Granulocytes: 0.02 10*3/uL (ref 0.00–0.07)
Basophils Absolute: 0.1 10*3/uL (ref 0.0–0.1)
Basophils Relative: 1 %
Eosinophils Absolute: 0.1 10*3/uL (ref 0.0–0.5)
Eosinophils Relative: 1 %
HCT: 28.3 % — ABNORMAL LOW (ref 39.0–52.0)
Hemoglobin: 8.8 g/dL — ABNORMAL LOW (ref 13.0–17.0)
Immature Granulocytes: 0 %
Lymphocytes Relative: 19 %
Lymphs Abs: 1.3 10*3/uL (ref 0.7–4.0)
MCH: 30 pg (ref 26.0–34.0)
MCHC: 31.1 g/dL (ref 30.0–36.0)
MCV: 96.6 fL (ref 80.0–100.0)
Monocytes Absolute: 0.5 10*3/uL (ref 0.1–1.0)
Monocytes Relative: 8 %
Neutro Abs: 4.6 10*3/uL (ref 1.7–7.7)
Neutrophils Relative %: 71 %
Platelet Count: 159 10*3/uL (ref 150–400)
RBC: 2.93 MIL/uL — ABNORMAL LOW (ref 4.22–5.81)
RDW: 22.3 % — ABNORMAL HIGH (ref 11.5–15.5)
WBC Count: 6.5 10*3/uL (ref 4.0–10.5)
nRBC: 0 % (ref 0.0–0.2)

## 2023-05-13 LAB — CMP (CANCER CENTER ONLY)
ALT: 6 U/L (ref 0–44)
AST: 9 U/L — ABNORMAL LOW (ref 15–41)
Albumin: 3.4 g/dL — ABNORMAL LOW (ref 3.5–5.0)
Alkaline Phosphatase: 53 U/L (ref 38–126)
Anion gap: 6 (ref 5–15)
BUN: 12 mg/dL (ref 8–23)
CO2: 27 mmol/L (ref 22–32)
Calcium: 8.8 mg/dL — ABNORMAL LOW (ref 8.9–10.3)
Chloride: 107 mmol/L (ref 98–111)
Creatinine: 0.51 mg/dL — ABNORMAL LOW (ref 0.61–1.24)
GFR, Estimated: >60 mL/min (ref >60)
Glucose, Bld: 118 mg/dL — ABNORMAL HIGH (ref 70–99)
Potassium: 3.7 mmol/L (ref 3.5–5.1)
Sodium: 140 mmol/L (ref 135–145)
Total Bilirubin: 0.4 mg/dL (ref 0.0–1.2)
Total Protein: 5.9 g/dL — ABNORMAL LOW (ref 6.5–8.1)

## 2023-05-13 MED ORDER — LEUCOVORIN CALCIUM INJECTION 350 MG
400.0000 mg/m2 | Freq: Once | INTRAVENOUS | Status: AC
  Administered 2023-05-13: 704 mg via INTRAVENOUS
  Filled 2023-05-13: qty 25

## 2023-05-13 MED ORDER — DEXAMETHASONE SODIUM PHOSPHATE 10 MG/ML IJ SOLN
10.0000 mg | Freq: Once | INTRAMUSCULAR | Status: AC
  Administered 2023-05-13: 10 mg via INTRAVENOUS
  Filled 2023-05-13: qty 1

## 2023-05-13 MED ORDER — HEPARIN SOD (PORK) LOCK FLUSH 100 UNIT/ML IV SOLN
500.0000 [IU] | Freq: Once | INTRAVENOUS | Status: DC | PRN
Start: 2023-05-13 — End: 2023-05-13

## 2023-05-13 MED ORDER — SODIUM CHLORIDE 0.9 % IV SOLN
40.0000 mg/m2 | Freq: Once | INTRAVENOUS | Status: AC
  Administered 2023-05-13: 68.8 mg via INTRAVENOUS
  Filled 2023-05-13: qty 16

## 2023-05-13 MED ORDER — PALONOSETRON HCL INJECTION 0.25 MG/5ML
0.2500 mg | Freq: Once | INTRAVENOUS | Status: AC
  Administered 2023-05-13: 0.25 mg via INTRAVENOUS
  Filled 2023-05-13: qty 5

## 2023-05-13 MED ORDER — SODIUM CHLORIDE 0.9 % IV SOLN
2400.0000 mg/m2 | INTRAVENOUS | Status: DC
  Administered 2023-05-13: 4200 mg via INTRAVENOUS
  Filled 2023-05-13: qty 84

## 2023-05-13 MED ORDER — OXALIPLATIN CHEMO INJECTION 100 MG/20ML
45.0000 mg/m2 | Freq: Once | INTRAVENOUS | Status: AC
  Administered 2023-05-13: 80 mg via INTRAVENOUS
  Filled 2023-05-13: qty 16

## 2023-05-13 MED ORDER — SODIUM CHLORIDE 0.9 % IV SOLN
150.0000 mg | Freq: Once | INTRAVENOUS | Status: AC
  Administered 2023-05-13: 150 mg via INTRAVENOUS
  Filled 2023-05-13: qty 150

## 2023-05-13 MED ORDER — SODIUM CHLORIDE 0.9% FLUSH
10.0000 mL | INTRAVENOUS | Status: DC | PRN
Start: 2023-05-13 — End: 2023-05-13

## 2023-05-13 MED ORDER — DEXTROSE 5 % IV SOLN: INTRAVENOUS | Status: DC

## 2023-05-13 MED ORDER — ATROPINE SULFATE 1 MG/ML IV SOLN
0.5000 mg | Freq: Once | INTRAVENOUS | Status: AC | PRN
  Administered 2023-05-13: 0.5 mg via INTRAVENOUS
  Filled 2023-05-13: qty 1

## 2023-05-13 NOTE — Progress Notes (Signed)
 Patient seen by [name] NP today  Vitals are within treatment parameters:Yes   Labs are within treatment parameters: Yes   Treatment plan has been signed: Yes   Per physician team, Patient is ready for treatment. Please note the following modifications: Irinotecan and oxaliplatin dose reduced beginning with today's treatment.

## 2023-05-13 NOTE — Progress Notes (Signed)
  [facility] OFFICE PROGRESS NOTE   Diagnosis: Pancreas cancer  INTERVAL HISTORY:   [name] returns for follow-up.  He completed a cycle of NALIRIFOX on 04/14/2023.  Treatment was held 04/29/2023 due to neutropenia.  He is feeling much better.  Appetite has improved.  He is gaining weight.  No nausea or vomiting.  No diarrhea.  No numbness or tingling in the hands or feet.  "Phlegm" is significantly improved.  Objective:  Vital signs in last 24 hours:  Blood pressure 123/76, pulse 77, temperature 98.1 F (36.7 C), temperature source Temporal, resp. rate 18, height 5\' 8"  (1.727 m), weight 149 lb 8 oz (67.8 kg), SpO2 100%.    HEENT: No thrush or ulcers. Resp: Lungs clear bilaterally. Cardio: Regular rate and rhythm. GI: Abdomen soft and nontender.  No hepatosplenomegaly. Vascular: No leg edema. Skin: Palms without erythema. Port-A-Cath without erythema.  Lab Results:  Lab Results  Component Value Date   WBC 6.5 05/13/2023   HGB 8.8 (L) 05/13/2023   HCT 28.3 (L) 05/13/2023   MCV 96.6 05/13/2023   PLT 159 05/13/2023   NEUTROABS 4.6 05/13/2023    Imaging:  No results found.  Medications: I have reviewed the patient's current medications.  Assessment/Plan: Pancreas cancer  bloating/constipation/anorexia and weight loss/back pain EGD 01/20/2023-diffuse moderate mucosal changes characterized by congestion and erythema in the gastric antrum; a large ulcerated and fungating mass with stigmata of recent bleeding in the duodenal bulb; segmental moderate inflammation characterized by congestion and erythema found at the pylorus.  There was no evidence of dysplasia or malignancy in the duodenum biopsies.   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 02/06/2023 CEA and CA 19-9 elevated 02/07/2023 biopsy left abdominal soft tissue implant-metastatic moderately differentiated adenocarcinoma, MSS, tumor mutation burden 4, HRD signature negative, [id number] 02/10/2023 CTs neck and chest-no metastatic disease.  Large cystic appearing pancreatic mass and adjacent cystic/necrotic peritoneal lesions. Cycle 1 NALIRIFOX 02/18/2023 Cycle 2 NALIRIFOX 03/06/2023 Cycle 3 NALIRIFOX 03/18/2023 Cycle 4 NALIRIFOX 04/01/2023 Cycle 5 NALIRIFOX 04/14/2023, doses adjusted for weight loss CT abdomen/pelvis 04/28/2023: Mild decrease in size pancreas body/tail mass and peritoneal implants.  New small volume splenic infarct, fistulous communication between the pancreas body mass and gastric body Cycle 6 NALIRIFOX 05/13/2023, irinotecan and oxaliplatin dose reduced Hypertension Hypersalivation following cycle 3 NALIRIFOX-likely related to irinotecan, improved with scopolamine patch  Disposition: Mr. Leger appears stable.  He has completed 5 cycles of NALIRIFOX.  Cycle 6 was held 04/29/2023 due to neutropenia.  CBC from today shows recovery of the neutrophil count, adequate to proceed with treatment.  Irinotecan and oxaliplatin dose reduced beginning with today's treatment.  Plan to proceed with cycle 6 NALIRIFOX today as scheduled.  He agrees with this plan.  CBC reviewed.  Counts adequate for treatment as above.  He will return for follow-up and cycle 7 NALIRIFOX in 2 weeks.  We are available to see him sooner if needed.   [name] ANP/GNP-BC   05/13/2023  9:20 AM

## 2023-05-14 LAB — CANCER ANTIGEN 19-9: CA 19-9: 299 U/mL — ABNORMAL HIGH (ref 0–35)

## 2023-05-15 ENCOUNTER — Inpatient Hospital Stay: Payer: Medicare Other

## 2023-05-15 VITALS — BP 128/74 | HR 71 | Temp 98.2°F | Resp 18

## 2023-05-15 DIAGNOSIS — Z5111 Encounter for antineoplastic chemotherapy: Secondary | ICD-10-CM | POA: Diagnosis not present

## 2023-05-15 DIAGNOSIS — I10 Essential (primary) hypertension: Secondary | ICD-10-CM | POA: Diagnosis not present

## 2023-05-15 DIAGNOSIS — D709 Neutropenia, unspecified: Secondary | ICD-10-CM | POA: Diagnosis not present

## 2023-05-15 DIAGNOSIS — R112 Nausea with vomiting, unspecified: Secondary | ICD-10-CM | POA: Diagnosis not present

## 2023-05-15 DIAGNOSIS — Z452 Encounter for adjustment and management of vascular access device: Secondary | ICD-10-CM | POA: Diagnosis not present

## 2023-05-15 DIAGNOSIS — K117 Disturbances of salivary secretion: Secondary | ICD-10-CM | POA: Diagnosis not present

## 2023-05-15 DIAGNOSIS — C252 Malignant neoplasm of tail of pancreas: Secondary | ICD-10-CM

## 2023-05-15 DIAGNOSIS — C258 Malignant neoplasm of overlapping sites of pancreas: Secondary | ICD-10-CM | POA: Diagnosis not present

## 2023-05-15 MED ORDER — SODIUM CHLORIDE 0.9% FLUSH
10.0000 mL | INTRAVENOUS | Status: DC | PRN
  Administered 2023-05-15: 10 mL

## 2023-05-15 MED ORDER — HEPARIN SOD (PORK) LOCK FLUSH 100 UNIT/ML IV SOLN
500.0000 [IU] | Freq: Once | INTRAVENOUS | Status: AC | PRN
  Administered 2023-05-15: 500 [IU]

## 2023-05-21 ENCOUNTER — Inpatient Hospital Stay: Attending: Nurse Practitioner | Admitting: Nutrition

## 2023-05-21 DIAGNOSIS — Z5111 Encounter for antineoplastic chemotherapy: Secondary | ICD-10-CM | POA: Insufficient documentation

## 2023-05-21 DIAGNOSIS — K117 Disturbances of salivary secretion: Secondary | ICD-10-CM | POA: Insufficient documentation

## 2023-05-21 DIAGNOSIS — I10 Essential (primary) hypertension: Secondary | ICD-10-CM | POA: Insufficient documentation

## 2023-05-21 DIAGNOSIS — D696 Thrombocytopenia, unspecified: Secondary | ICD-10-CM | POA: Insufficient documentation

## 2023-05-21 DIAGNOSIS — C258 Malignant neoplasm of overlapping sites of pancreas: Secondary | ICD-10-CM | POA: Insufficient documentation

## 2023-05-21 NOTE — Progress Notes (Signed)
 Attempted to contact patient at home telephone number. Patient not available but left name and phone number for return call.  Nutrition follow up completed with patient's wife over the telephone using mobile number.  Weight increased to 149 pounds 8 oz on March 25 Weighed 142 pounds 14.4 oz on March 11.  Noted labs: Glucose 118, Creatinine 0.51, Albumin 3.4, and Ca 19--9 299 (decreased)  Appetite has improved and patient eating better. Endorses recent weight gain. Denies nausea, vomiting, constipation and diarrhea. Considering adding a protein drink using a protein powder and juice/fruit.  Nutrition Diagnosis: Unintended weight loss has improved.  Intervention: Agree with adding protein shake or smoothies. Continue high calorie, high protein foods in frequent small meals/snacks to promote weight gain. Monitor and address nutrition impact symptoms.  Monitoring, Evaluation, Goals: Increase calories and protein to improve weight.  Next Visit: To be scheduled as needed.

## 2023-05-23 ENCOUNTER — Telehealth: Payer: Self-pay | Admitting: *Deleted

## 2023-05-23 NOTE — Telephone Encounter (Addendum)
 Mrs. Creeden called to inquire why he is not able to have his treatment on the same day he is having his chemo. Says this is difficult for those who are trying to help him get to appointments.  Informed her that due to later appointment with provider, it does not allow enough time to be treated same day for this occurrence. The following tx is on same day as OV>

## 2023-05-24 ENCOUNTER — Other Ambulatory Visit: Payer: Self-pay

## 2023-05-25 ENCOUNTER — Other Ambulatory Visit: Payer: Self-pay | Admitting: Oncology

## 2023-05-25 DIAGNOSIS — C252 Malignant neoplasm of tail of pancreas: Secondary | ICD-10-CM

## 2023-05-26 ENCOUNTER — Other Ambulatory Visit: Payer: Self-pay

## 2023-05-27 ENCOUNTER — Other Ambulatory Visit: Payer: Self-pay

## 2023-05-27 ENCOUNTER — Inpatient Hospital Stay

## 2023-05-27 ENCOUNTER — Inpatient Hospital Stay (HOSPITAL_BASED_OUTPATIENT_CLINIC_OR_DEPARTMENT_OTHER): Admitting: Nurse Practitioner

## 2023-05-27 ENCOUNTER — Encounter: Payer: Self-pay | Admitting: Nurse Practitioner

## 2023-05-27 VITALS — BP 116/70 | HR 70 | Temp 98.2°F | Resp 18 | Ht 68.0 in | Wt 155.8 lb

## 2023-05-27 DIAGNOSIS — I10 Essential (primary) hypertension: Secondary | ICD-10-CM | POA: Diagnosis not present

## 2023-05-27 DIAGNOSIS — C252 Malignant neoplasm of tail of pancreas: Secondary | ICD-10-CM

## 2023-05-27 DIAGNOSIS — C258 Malignant neoplasm of overlapping sites of pancreas: Secondary | ICD-10-CM | POA: Diagnosis not present

## 2023-05-27 DIAGNOSIS — Z5111 Encounter for antineoplastic chemotherapy: Secondary | ICD-10-CM | POA: Diagnosis not present

## 2023-05-27 DIAGNOSIS — Z95828 Presence of other vascular implants and grafts: Secondary | ICD-10-CM

## 2023-05-27 DIAGNOSIS — D696 Thrombocytopenia, unspecified: Secondary | ICD-10-CM | POA: Diagnosis not present

## 2023-05-27 DIAGNOSIS — K117 Disturbances of salivary secretion: Secondary | ICD-10-CM | POA: Diagnosis not present

## 2023-05-27 LAB — CMP (CANCER CENTER ONLY)
ALT: 9 U/L (ref 0–44)
AST: 11 U/L — ABNORMAL LOW (ref 15–41)
Albumin: 3.6 g/dL (ref 3.5–5.0)
Alkaline Phosphatase: 50 U/L (ref 38–126)
Anion gap: 7 (ref 5–15)
BUN: 18 mg/dL (ref 8–23)
CO2: 26 mmol/L (ref 22–32)
Calcium: 8.6 mg/dL — ABNORMAL LOW (ref 8.9–10.3)
Chloride: 107 mmol/L (ref 98–111)
Creatinine: 0.48 mg/dL — ABNORMAL LOW (ref 0.61–1.24)
GFR, Estimated: >60 mL/min (ref >60)
Glucose, Bld: 124 mg/dL — ABNORMAL HIGH (ref 70–99)
Potassium: 3.7 mmol/L (ref 3.5–5.1)
Sodium: 140 mmol/L (ref 135–145)
Total Bilirubin: 0.3 mg/dL (ref 0.0–1.2)
Total Protein: 6 g/dL — ABNORMAL LOW (ref 6.5–8.1)

## 2023-05-27 LAB — CBC WITH DIFFERENTIAL (CANCER CENTER ONLY)
Abs Immature Granulocytes: 0 10*3/uL (ref 0.00–0.07)
Basophils Absolute: 0 10*3/uL (ref 0.0–0.1)
Basophils Relative: 1 %
Eosinophils Absolute: 0.2 10*3/uL (ref 0.0–0.5)
Eosinophils Relative: 4 %
HCT: 29.4 % — ABNORMAL LOW (ref 39.0–52.0)
Hemoglobin: 9.2 g/dL — ABNORMAL LOW (ref 13.0–17.0)
Immature Granulocytes: 0 %
Lymphocytes Relative: 24 %
Lymphs Abs: 1.2 10*3/uL (ref 0.7–4.0)
MCH: 31.1 pg (ref 26.0–34.0)
MCHC: 31.3 g/dL (ref 30.0–36.0)
MCV: 99.3 fL (ref 80.0–100.0)
Monocytes Absolute: 0.5 10*3/uL (ref 0.1–1.0)
Monocytes Relative: 9 %
Neutro Abs: 3 10*3/uL (ref 1.7–7.7)
Neutrophils Relative %: 62 %
Platelet Count: 119 10*3/uL — ABNORMAL LOW (ref 150–400)
RBC: 2.96 MIL/uL — ABNORMAL LOW (ref 4.22–5.81)
RDW: 21 % — ABNORMAL HIGH (ref 11.5–15.5)
WBC Count: 4.8 10*3/uL (ref 4.0–10.5)
nRBC: 0 % (ref 0.0–0.2)

## 2023-05-27 MED ORDER — SODIUM CHLORIDE 0.9% FLUSH
10.0000 mL | Freq: Once | INTRAVENOUS | Status: AC
  Administered 2023-05-27: 10 mL via INTRAVENOUS

## 2023-05-27 MED ORDER — HEPARIN SOD (PORK) LOCK FLUSH 100 UNIT/ML IV SOLN
500.0000 [IU] | Freq: Once | INTRAVENOUS | Status: AC
  Administered 2023-05-27: 500 [IU] via INTRAVENOUS

## 2023-05-27 NOTE — Progress Notes (Addendum)
 Patient seen by [name] NP today  Vitals are within treatment parameters:Yes  Labs are within treatment parameters: Yes  Treatment plan has been signed: Yes   Per physician team, Patient is ready for treatment and there are NO modifications to the treatment plan. Chemo is schedule on 05/28/23

## 2023-05-27 NOTE — Progress Notes (Signed)
  [facility] OFFICE PROGRESS NOTE   Diagnosis: Pancreas cancer  INTERVAL HISTORY:   Ralph Davis returns as scheduled.  He completed cycle 6 NALIRIFOX 05/13/2023.  Irinotecan and Oxaliplatin were dose reduced.  He feels he tolerated cycle 6 well.  No nausea or vomiting.  No mouth sores.  No diarrhea.  No significant cold sensitivity.  No numbness or tingling in the hands or feet.  He has a good appetite.  He is gaining weight.  Objective:  Vital signs in last 24 hours:  Blood pressure 116/70, pulse 70, temperature 98.2 F (36.8 C), temperature source Temporal, resp. rate 18, height 5\' 8"  (1.727 m), weight 155 lb 12.8 oz (70.7 kg), SpO2 100%.    HEENT: No thrush or ulcers. Resp: Lungs clear bilaterally. Cardio: Regular rate and rhythm. GI: Abdomen soft and nontender.  No hepatosplenomegaly. Vascular: No leg edema. Skin: Palms without erythema. Port-A-Cath without erythema.  Lab Results:  Lab Results  Component Value Date   WBC 4.8 05/27/2023   HGB 9.2 (L) 05/27/2023   HCT 29.4 (L) 05/27/2023   MCV 99.3 05/27/2023   PLT 119 (L) 05/27/2023   NEUTROABS 3.0 05/27/2023    Imaging:  No results found.  Medications: I have reviewed the patient's current medications.  Assessment/Plan: Pancreas cancer  bloating/constipation/anorexia and weight loss/back pain EGD 01/20/2023-diffuse moderate mucosal changes characterized by congestion and erythema in the gastric antrum; a large ulcerated and fungating mass with stigmata of recent bleeding in the duodenal bulb; segmental moderate inflammation characterized by congestion and erythema found at the pylorus.  There was no evidence of dysplasia or malignancy in the duodenum biopsies.   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 02/06/2023 CEA and CA 19-9 elevated 02/07/2023 biopsy left abdominal soft tissue implant-metastatic moderately differentiated adenocarcinoma, MSS, tumor mutation burden 4, HRD signature negative, [id number] 02/10/2023 CTs neck and chest-no metastatic disease.  Large cystic appearing pancreatic mass and adjacent cystic/necrotic peritoneal lesions. Cycle 1 NALIRIFOX 02/18/2023 Cycle 2 NALIRIFOX 03/06/2023 Cycle 3 NALIRIFOX 03/18/2023 Cycle 4 NALIRIFOX 04/01/2023 Cycle 5 NALIRIFOX 04/14/2023, doses adjusted for weight loss CT abdomen/pelvis 04/28/2023: Mild decrease in size pancreas body/tail mass and peritoneal implants.  New small volume splenic infarct, fistulous communication between the pancreas body mass and gastric body Cycle 6 NALIRIFOX 05/13/2023, irinotecan and oxaliplatin dose reduced Cycle 7 NALIRIFOX 05/28/2023 Hypertension Hypersalivation following cycle 3 NALIRIFOX-likely related to irinotecan, improved with scopolamine patch  Disposition: Ralph Davis appears stable.  He has completed 6 cycles of NALIRIFOX.  He tolerated cycle 6 well.  There is no clinical evidence of disease progression.  Plan to proceed with cycle 7 today as scheduled.  CBC and chemistry panel reviewed.  Labs adequate to proceed as above.  He will return for follow-up and treatment in 2 weeks.  He will contact the office in the interim with any problems    [name] ANP/GNP-BC   05/27/2023  11:02 AM

## 2023-05-28 ENCOUNTER — Inpatient Hospital Stay

## 2023-05-28 ENCOUNTER — Telehealth: Payer: Self-pay

## 2023-05-28 VITALS — BP 142/77 | HR 64 | Temp 98.2°F | Resp 19

## 2023-05-28 DIAGNOSIS — Z5111 Encounter for antineoplastic chemotherapy: Secondary | ICD-10-CM | POA: Diagnosis not present

## 2023-05-28 DIAGNOSIS — K117 Disturbances of salivary secretion: Secondary | ICD-10-CM | POA: Diagnosis not present

## 2023-05-28 DIAGNOSIS — C258 Malignant neoplasm of overlapping sites of pancreas: Secondary | ICD-10-CM | POA: Diagnosis not present

## 2023-05-28 DIAGNOSIS — D696 Thrombocytopenia, unspecified: Secondary | ICD-10-CM | POA: Diagnosis not present

## 2023-05-28 DIAGNOSIS — C252 Malignant neoplasm of tail of pancreas: Secondary | ICD-10-CM

## 2023-05-28 DIAGNOSIS — I10 Essential (primary) hypertension: Secondary | ICD-10-CM | POA: Diagnosis not present

## 2023-05-28 LAB — CANCER ANTIGEN 19-9: CA 19-9: 201 U/mL — ABNORMAL HIGH (ref 0–35)

## 2023-05-28 MED ORDER — ATROPINE SULFATE 1 MG/ML IV SOLN
0.5000 mg | Freq: Once | INTRAVENOUS | Status: AC | PRN
  Administered 2023-05-28: 0.5 mg via INTRAVENOUS
  Filled 2023-05-28: qty 1

## 2023-05-28 MED ORDER — PALONOSETRON HCL INJECTION 0.25 MG/5ML
0.2500 mg | Freq: Once | INTRAVENOUS | Status: AC
  Administered 2023-05-28: 0.25 mg via INTRAVENOUS
  Filled 2023-05-28: qty 5

## 2023-05-28 MED ORDER — SODIUM CHLORIDE 0.9 % IV SOLN
40.0000 mg/m2 | Freq: Once | INTRAVENOUS | Status: AC
  Administered 2023-05-28: 68.8 mg via INTRAVENOUS
  Filled 2023-05-28: qty 16

## 2023-05-28 MED ORDER — LEUCOVORIN CALCIUM INJECTION 350 MG
400.0000 mg/m2 | Freq: Once | INTRAVENOUS | Status: AC
  Administered 2023-05-28: 704 mg via INTRAVENOUS
  Filled 2023-05-28: qty 25

## 2023-05-28 MED ORDER — SODIUM CHLORIDE 0.9 % IV SOLN
2400.0000 mg/m2 | INTRAVENOUS | Status: DC
  Administered 2023-05-28: 4200 mg via INTRAVENOUS
  Filled 2023-05-28: qty 84

## 2023-05-28 MED ORDER — OXALIPLATIN CHEMO INJECTION 100 MG/20ML
45.0000 mg/m2 | Freq: Once | INTRAVENOUS | Status: AC
  Administered 2023-05-28: 80 mg via INTRAVENOUS
  Filled 2023-05-28: qty 16

## 2023-05-28 MED ORDER — SODIUM CHLORIDE 0.9 % IV SOLN
150.0000 mg | Freq: Once | INTRAVENOUS | Status: AC
  Administered 2023-05-28: 150 mg via INTRAVENOUS
  Filled 2023-05-28: qty 150

## 2023-05-28 MED ORDER — DEXTROSE 5 % IV SOLN: INTRAVENOUS | Status: DC

## 2023-05-28 MED ORDER — DEXAMETHASONE SODIUM PHOSPHATE 10 MG/ML IJ SOLN
10.0000 mg | Freq: Once | INTRAMUSCULAR | Status: AC
  Administered 2023-05-28: 10 mg via INTRAVENOUS
  Filled 2023-05-28: qty 1

## 2023-05-28 NOTE — Telephone Encounter (Signed)
 The patient was in the infusion, and Nurse Sheralyn Boatman informed him that his CA 19-9 levels have decreased.

## 2023-05-28 NOTE — Patient Instructions (Addendum)
 [facility] - A DEPT OF [facility]   Discharge Instructions: The chemotherapy medication bag should finish at 46 hours, 96 hours, or 7 days. For example, if your pump is scheduled for 46 hours and it was put on at 4:00 p.m., it should finish at 2:00 p.m. the day it is scheduled to come off regardless of your appointment time.     Estimated time to finish at 12:15 FRIDAY, May 30, 2023.   If the display on your pump reads "Low Volume" and it is beeping, take the batteries out of the pump and come to the cancer center for it to be taken off.   If the pump alarms go off prior to the pump reading "Low Volume" then call [phone] and someone can assist you.  If the plunger comes out and the chemotherapy medication is leaking out, please use your home chemo spill kit to clean up the spill. Do NOT use paper towels or other household products.  If you have problems or questions regarding your pump, please call either [phone] (24 hours a day) or the cancer center Monday-Friday 8:00 a.m.- 4:30 p.m. at the clinic number and we will assist you. If you are unable to get assistance, then go to the nearest Emergency Department and ask the staff to contact the IV team for assistance.   Thank you for choosing [facility] to provide your oncology and hematology care.   If you have a lab appointment with the Cancer Center, please go directly to the Cancer Center and check in at the registration area.   Wear comfortable clothing and clothing appropriate for easy access to any Portacath or PICC line.   We strive to give you quality time with your provider. You may need to reschedule your appointment if you arrive late (15 or more minutes).  Arriving late affects you and other patients whose appointments are after yours.  Also, if you miss three or more appointments without notifying the office, you may be dismissed from the clinic at the provider's  discretion.      For prescription refill requests, have your pharmacy contact our office and allow 72 hours for refills to be completed.    Today you received the following chemotherapy and/or immunotherapy agents IRINOTECAN LIPOSOME/OXALIPLATIN/LEUCOVORIN/FLUOROURACIL        To help prevent nausea and vomiting after your treatment, we encourage you to take your nausea medication as directed.  BELOW ARE SYMPTOMS THAT SHOULD BE REPORTED IMMEDIATELY: *FEVER GREATER THAN 100.4 F (38 C) OR HIGHER *CHILLS OR SWEATING *NAUSEA AND VOMITING THAT IS NOT CONTROLLED WITH YOUR NAUSEA MEDICATION *UNUSUAL SHORTNESS OF BREATH *UNUSUAL BRUISING OR BLEEDING *URINARY PROBLEMS (pain or burning when urinating, or frequent urination) *BOWEL PROBLEMS (unusual diarrhea, constipation, pain near the anus) TENDERNESS IN MOUTH AND THROAT WITH OR WITHOUT PRESENCE OF ULCERS (sore throat, sores in mouth, or a toothache) UNUSUAL RASH, SWELLING OR PAIN  UNUSUAL VAGINAL DISCHARGE OR ITCHING   Items with * indicate a potential emergency and should be followed up as soon as possible or go to the Emergency Department if any problems should occur.  Please show the CHEMOTHERAPY ALERT CARD or IMMUNOTHERAPY ALERT CARD at check-in to the Emergency Department and triage nurse.  Should you have questions after your visit or need to cancel or reschedule your appointment, please contact [facility]  Dept: [phone]  and follow the prompts.  Office hours are 8:00 a.m. to 4:30 p.m. Monday - Friday. Please note that voicemails left after 4:00 p.m. may not be returned until the following business day.  We are closed weekends and major holidays. You have access to a nurse at all times for urgent questions. Please call the main number to the clinic Dept: [phone] and follow the prompts.   For any non-urgent questions, you may also contact your provider using MyChart. We now  offer e-Visits for anyone 22 and older to request care online for non-urgent symptoms. For details visit [email or web address].   Also download the MyChart app! Go to the app store, search "MyChart", open the app, select [facility], and log in with your MyChart username and password.  Irinotecan Liposome Injection What is this medication? IRINOTECAN LIPOSOME (eye ri noe TEE kan LIP oh som) treats pancreatic cancer. It works by slowing down the growth of cancer cells. This medicine may be used for other purposes; ask your health care provider or pharmacist if you have questions. COMMON BRAND NAME(S): ONIVYDE What should I tell my care team before I take this medication? They need to know if you have any of these conditions: Blockage in your bowels Dehydration Infection Low white blood cell levels Lung disease An unusual or allergic reaction to irinotecan liposome, irinotecan, other medications, foods, dyes, or preservatives Pregnant or trying to get pregnant Breast-feeding How should I use this medication? This medication is injected into a vein. It is given by your care team in a hospital or clinic setting. Talk to your care team about the use of this medication in children. Special care may be needed. Overdosage: If you think you have taken too much of this medicine contact a poison control center or emergency room at once. NOTE: This medicine is only for you. Do not share this medicine with others. What if I miss a dose? Keep appointments for follow-up doses. It is important not to miss your dose. Call your care team if you are unable to keep an appointment. What may interact with this medication? Do not take this medication with any of the following: Itraconazole This medication may also interact with the following: Certain antivirals for HIV or AIDS Certain medications for seizures, such as carbamazepine, fosphenytoin, phenytoin,  phenobarbital Clarithromycin Gemfibrozil Nefazodone Rifabutin Rifampin Rifapentine St. John's Wort Voriconazole This list may not describe all possible interactions. Give your health care provider a list of all the medicines, herbs, non-prescription drugs, or dietary supplements you use. Also tell them if you smoke, drink alcohol, or use illegal drugs. Some items may interact with your medicine. What should I watch for while using this medication? This medication may make you feel generally unwell. This is not uncommon as chemotherapy can affect healthy cells as well as cancer cells. Report any side effects. Continue your course of treatment even though you feel ill unless your care team tells you to stop. You may need blood work while you are taking this medication. This medication can cause serious side effects and allergic reactions. To reduce your risk, your care team may give you other medications to take before receiving this one. Be sure to follow the directions from your care team. Check with your care team if you get an attack of diarrhea, nausea and vomiting, or if you sweat a lot. The loss of too much body fluid can make it dangerous for you to take this medication. This medication may cause infertility. Talk to your care team if you  are concerned about your fertility. Talk to your care team if you wish to become pregnant or if you think you might be pregnant. This medication can cause serious birth defects if taken during pregnancy or if you get pregnant within 7 months after stopping therapy. A negative pregnancy test is required before starting this medication. A reliable form of contraception is recommended while taking this medication and for 7 months after stopping it. Talk to your care team about reliable forms of contraception. Use a condom during sex and for 4 months after stopping therapy. Tell your care team right away if you think your partner might be pregnant. This  medication can cause serious birth defects. Do not breast-feed while taking this medication and for 1 month after stopping therapy. This medication may increase your risk of getting an infection. Call your care team for advice if you get a fever, chills, sore throat, or other symptoms of a cold or flu. Do not treat yourself. Try to avoid being around people who are sick. Avoid taking medications that contain aspirin, acetaminophen, ibuprofen, naproxen, or ketoprofen unless instructed by your care team. These medications may hide a fever. Be careful brushing or flossing your teeth or using a toothpick because you may get an infection or bleed more easily. If you have any dental work done, tell your dentist you are receiving this medication. What side effects may I notice from receiving this medication? Side effects that you should report to your care team as soon as possible: Allergic reactions or angioedema--skin rash, itching or hives, swelling of the face, eyes, lips, tongue, arms, or legs, trouble swallowing or breathing Dry cough, shortness of breath or trouble breathing Diarrhea Infection--fever, chills, cough, or sore throat Side effects that usually do not require medical attention (report to your care team if they continue or are bothersome): Fatigue Loss of appetite Nausea Pain, redness, or swelling with sores inside the mouth or throat Vomiting This list may not describe all possible side effects. Call your doctor for medical advice about side effects. You may report side effects to FDA at 1-800-FDA-1088. Where should I keep my medication? This medication is given in a hospital or clinic. It will not be stored at home. NOTE: This sheet is a summary. It may not cover all possible information. If you have questions about this medicine, talk to your doctor, pharmacist, or health care provider.  2024 Elsevier/Gold Standard (2021-04-05 00:00:00)  Oxaliplatin Injection What is this  medication? OXALIPLATIN (ox AL i PLA tin) treats colorectal cancer. It works by slowing down the growth of cancer cells. This medicine may be used for other purposes; ask your health care provider or pharmacist if you have questions. COMMON BRAND NAME(S): Eloxatin What should I tell my care team before I take this medication? They need to know if you have any of these conditions: Heart disease History of irregular heartbeat or rhythm Liver disease Low blood cell levels (white cells, red cells, and platelets) Lung or breathing disease, such as asthma Take medications that treat or prevent blood clots Tingling of the fingers, toes, or other nerve disorder An unusual or allergic reaction to oxaliplatin, other medications, foods, dyes, or preservatives If you or your partner are pregnant or trying to get pregnant Breast-feeding How should I use this medication? This medication is injected into a vein. It is given by your care team in a hospital or clinic setting. Talk to your care team about the use of this medication in children.  Special care may be needed. Overdosage: If you think you have taken too much of this medicine contact a poison control center or emergency room at once. NOTE: This medicine is only for you. Do not share this medicine with others. What if I miss a dose? Keep appointments for follow-up doses. It is important not to miss a dose. Call your care team if you are unable to keep an appointment. What may interact with this medication? Do not take this medication with any of the following: Cisapride Dronedarone Pimozide Thioridazine This medication may also interact with the following: Aspirin and aspirin-like medications Certain medications that treat or prevent blood clots, such as warfarin, apixaban, dabigatran, and rivaroxaban Cisplatin Cyclosporine Diuretics Medications for infection, such as acyclovir, adefovir, amphotericin B, bacitracin, cidofovir, foscarnet,  ganciclovir, gentamicin, pentamidine, vancomycin NSAIDs, medications for pain and inflammation, such as ibuprofen or naproxen Other medications that cause heart rhythm changes Pamidronate Zoledronic acid This list may not describe all possible interactions. Give your health care provider a list of all the medicines, herbs, non-prescription drugs, or dietary supplements you use. Also tell them if you smoke, drink alcohol, or use illegal drugs. Some items may interact with your medicine. What should I watch for while using this medication? Your condition will be monitored carefully while you are receiving this medication. You may need blood work while taking this medication. This medication may make you feel generally unwell. This is not uncommon as chemotherapy can affect healthy cells as well as cancer cells. Report any side effects. Continue your course of treatment even though you feel ill unless your care team tells you to stop. This medication may increase your risk of getting an infection. Call your care team for advice if you get a fever, chills, sore throat, or other symptoms of a cold or flu. Do not treat yourself. Try to avoid being around people who are sick. Avoid taking medications that contain aspirin, acetaminophen, ibuprofen, naproxen, or ketoprofen unless instructed by your care team. These medications may hide a fever. Be careful brushing or flossing your teeth or using a toothpick because you may get an infection or bleed more easily. If you have any dental work done, tell your dentist you are receiving this medication. This medication can make you more sensitive to cold. Do not drink cold drinks or use ice. Cover exposed skin before coming in contact with cold temperatures or cold objects. When out in cold weather wear warm clothing and cover your mouth and nose to warm the air that goes into your lungs. Tell your care team if you get sensitive to the cold. Talk to your care team if  you or your partner are pregnant or think either of you might be pregnant. This medication can cause serious birth defects if taken during pregnancy and for 9 months after the last dose. A negative pregnancy test is required before starting this medication. A reliable form of contraception is recommended while taking this medication and for 9 months after the last dose. Talk to your care team about effective forms of contraception. Do not father a child while taking this medication and for 6 months after the last dose. Use a condom while having sex during this time period. Do not breastfeed while taking this medication and for 3 months after the last dose. This medication may cause infertility. Talk to your care team if you are concerned about your fertility. What side effects may I notice from receiving this medication? Side effects that  you should report to your care team as soon as possible: Allergic reactions--skin rash, itching, hives, swelling of the face, lips, tongue, or throat Bleeding--bloody or black, tar-like stools, vomiting blood or brown material that looks like coffee grounds, red or dark brown urine, small red or purple spots on skin, unusual bruising or bleeding Dry cough, shortness of breath or trouble breathing Heart rhythm changes--fast or irregular heartbeat, dizziness, feeling faint or lightheaded, chest pain, trouble breathing Infection--fever, chills, cough, sore throat, wounds that don't heal, pain or trouble when passing urine, general feeling of discomfort or being unwell Liver injury--right upper belly pain, loss of appetite, nausea, light-colored stool, dark yellow or brown urine, yellowing skin or eyes, unusual weakness or fatigue Low red blood cell level--unusual weakness or fatigue, dizziness, headache, trouble breathing Muscle injury--unusual weakness or fatigue, muscle pain, dark yellow or brown urine, decrease in amount of urine Pain, tingling, or numbness in the  hands or feet Sudden and severe headache, confusion, change in vision, seizures, which may be signs of posterior reversible encephalopathy syndrome (PRES) Unusual bruising or bleeding Side effects that usually do not require medical attention (report to your care team if they continue or are bothersome): Diarrhea Nausea Pain, redness, or swelling with sores inside the mouth or throat Unusual weakness or fatigue Vomiting This list may not describe all possible side effects. Call your doctor for medical advice about side effects. You may report side effects to FDA at 1-800-FDA-1088. Where should I keep my medication? This medication is given in a hospital or clinic. It will not be stored at home. NOTE: This sheet is a summary. It may not cover all possible information. If you have questions about this medicine, talk to your doctor, pharmacist, or health care provider.  2024 Elsevier/Gold Standard (2023-01-17 00:00:00)  Leucovorin Injection What is this medication? LEUCOVORIN (loo koe VOR in) prevents side effects from certain medications, such as methotrexate. It works by increasing folate levels. This helps protect healthy cells in your body. It may also be used to treat anemia caused by low levels of folate. It can also be used with fluorouracil, a type of chemotherapy, to treat colorectal cancer. It works by increasing the effects of fluorouracil in the body. This medicine may be used for other purposes; ask your health care provider or pharmacist if you have questions. What should I tell my care team before I take this medication? They need to know if you have any of these conditions: Anemia from low levels of vitamin B12 in the blood An unusual or allergic reaction to leucovorin, folic acid, other medications, foods, dyes, or preservatives Pregnant or trying to get pregnant Breastfeeding How should I use this medication? This medication is injected into a vein or a muscle. It is given  by your care team in a hospital or clinic setting. Talk to your care team about the use of this medication in children. Special care may be needed. Overdosage: If you think you have taken too much of this medicine contact a poison control center or emergency room at once. NOTE: This medicine is only for you. Do not share this medicine with others. What if I miss a dose? Keep appointments for follow-up doses. It is important not to miss your dose. Call your care team if you are unable to keep an appointment. What may interact with this medication? Capecitabine Fluorouracil Phenobarbital Phenytoin Primidone Trimethoprim;sulfamethoxazole This list may not describe all possible interactions. Give your health care provider a list of  all the medicines, herbs, non-prescription drugs, or dietary supplements you use. Also tell them if you smoke, drink alcohol, or use illegal drugs. Some items may interact with your medicine. What should I watch for while using this medication? Your condition will be monitored carefully while you are receiving this medication. This medication may increase the side effects of 5-fluorouracil. Tell your care team if you have diarrhea or mouth sores that do not get better or that get worse. What side effects may I notice from receiving this medication? Side effects that you should report to your care team as soon as possible: Allergic reactions--skin rash, itching, hives, swelling of the face, lips, tongue, or throat This list may not describe all possible side effects. Call your doctor for medical advice about side effects. You may report side effects to FDA at 1-800-FDA-1088. Where should I keep my medication? This medication is given in a hospital or clinic. It will not be stored at home. NOTE: This sheet is a summary. It may not cover all possible information. If you have questions about this medicine, talk to your doctor, pharmacist, or health care provider.  2024  Elsevier/Gold Standard (2021-07-10 00:00:00)  Fluorouracil Injection What is this medication? FLUOROURACIL (flure oh YOOR a sil) treats some types of cancer. It works by slowing down the growth of cancer cells. This medicine may be used for other purposes; ask your health care provider or pharmacist if you have questions. COMMON BRAND NAME(S): Adrucil What should I tell my care team before I take this medication? They need to know if you have any of these conditions: Blood disorders Dihydropyrimidine dehydrogenase (DPD) deficiency Infection, such as chickenpox, cold sores, herpes Kidney disease Liver disease Poor nutrition Recent or ongoing radiation therapy An unusual or allergic reaction to fluorouracil, other medications, foods, dyes, or preservatives If you or your partner are pregnant or trying to get pregnant Breast-feeding How should I use this medication? This medication is injected into a vein. It is administered by your care team in a hospital or clinic setting. Talk to your care team about the use of this medication in children. Special care may be needed. Overdosage: If you think you have taken too much of this medicine contact a poison control center or emergency room at once. NOTE: This medicine is only for you. Do not share this medicine with others. What if I miss a dose? Keep appointments for follow-up doses. It is important not to miss your dose. Call your care team if you are unable to keep an appointment. What may interact with this medication? Do not take this medication with any of the following: Live virus vaccines This medication may also interact with the following: Medications that treat or prevent blood clots, such as warfarin, enoxaparin, dalteparin This list may not describe all possible interactions. Give your health care provider a list of all the medicines, herbs, non-prescription drugs, or dietary supplements you use. Also tell them if you smoke, drink  alcohol, or use illegal drugs. Some items may interact with your medicine. What should I watch for while using this medication? Your condition will be monitored carefully while you are receiving this medication. This medication may make you feel generally unwell. This is not uncommon as chemotherapy can affect healthy cells as well as cancer cells. Report any side effects. Continue your course of treatment even though you feel ill unless your care team tells you to stop. In some cases, you may be given additional medications to help  with side effects. Follow all directions for their use. This medication may increase your risk of getting an infection. Call your care team for advice if you get a fever, chills, sore throat, or other symptoms of a cold or flu. Do not treat yourself. Try to avoid being around people who are sick. This medication may increase your risk to bruise or bleed. Call your care team if you notice any unusual bleeding. Be careful brushing or flossing your teeth or using a toothpick because you may get an infection or bleed more easily. If you have any dental work done, tell your dentist you are receiving this medication. Avoid taking medications that contain aspirin, acetaminophen, ibuprofen, naproxen, or ketoprofen unless instructed by your care team. These medications may hide a fever. Do not treat diarrhea with over the counter products. Contact your care team if you have diarrhea that lasts more than 2 days or if it is severe and watery. This medication can make you more sensitive to the sun. Keep out of the sun. If you cannot avoid being in the sun, wear protective clothing and sunscreen. Do not use sun lamps, tanning beds, or tanning booths. Talk to your care team if you or your partner wish to become pregnant or think you might be pregnant. This medication can cause serious birth defects if taken during pregnancy and for 3 months after the last dose. A reliable form of  contraception is recommended while taking this medication and for 3 months after the last dose. Talk to your care team about effective forms of contraception. Do not father a child while taking this medication and for 3 months after the last dose. Use a condom while having sex during this time period. Do not breastfeed while taking this medication. This medication may cause infertility. Talk to your care team if you are concerned about your fertility. What side effects may I notice from receiving this medication? Side effects that you should report to your care team as soon as possible: Allergic reactions--skin rash, itching, hives, swelling of the face, lips, tongue, or throat Heart attack--pain or tightness in the chest, shoulders, arms, or jaw, nausea, shortness of breath, cold or clammy skin, feeling faint or lightheaded Heart failure--shortness of breath, swelling of the ankles, feet, or hands, sudden weight gain, unusual weakness or fatigue Heart rhythm changes--fast or irregular heartbeat, dizziness, feeling faint or lightheaded, chest pain, trouble breathing High ammonia level--unusual weakness or fatigue, confusion, loss of appetite, nausea, vomiting, seizures Infection--fever, chills, cough, sore throat, wounds that don't heal, pain or trouble when passing urine, general feeling of discomfort or being unwell Low red blood cell level--unusual weakness or fatigue, dizziness, headache, trouble breathing Pain, tingling, or numbness in the hands or feet, muscle weakness, change in vision, confusion or trouble speaking, loss of balance or coordination, trouble walking, seizures Redness, swelling, and blistering of the skin over hands and feet Severe or prolonged diarrhea Unusual bruising or bleeding Side effects that usually do not require medical attention (report to your care team if they continue or are bothersome): Dry skin Headache Increased tears Nausea Pain, redness, or swelling with  sores inside the mouth or throat Sensitivity to light Vomiting This list may not describe all possible side effects. Call your doctor for medical advice about side effects. You may report side effects to FDA at 1-800-FDA-1088. Where should I keep my medication? This medication is given in a hospital or clinic. It will not be stored at home. NOTE: This sheet  is a summary. It may not cover all possible information. If you have questions about this medicine, talk to your doctor, pharmacist, or health care provider.  2024 Elsevier/Gold Standard (2021-06-12 00:00:00)

## 2023-05-28 NOTE — Telephone Encounter (Signed)
-----   Message from [name] sent at 05/28/2023  8:38 AM EDT ----- Please let him know the CA 19-9 is lower.  Follow-up as scheduled.

## 2023-05-29 ENCOUNTER — Other Ambulatory Visit: Payer: Self-pay

## 2023-05-30 ENCOUNTER — Inpatient Hospital Stay

## 2023-05-30 VITALS — BP 131/76 | HR 70 | Temp 98.4°F | Resp 18

## 2023-05-30 DIAGNOSIS — C252 Malignant neoplasm of tail of pancreas: Secondary | ICD-10-CM

## 2023-06-05 ENCOUNTER — Other Ambulatory Visit: Payer: Self-pay | Admitting: Oncology

## 2023-06-05 DIAGNOSIS — C252 Malignant neoplasm of tail of pancreas: Secondary | ICD-10-CM

## 2023-06-10 ENCOUNTER — Other Ambulatory Visit: Payer: Self-pay

## 2023-06-11 ENCOUNTER — Other Ambulatory Visit

## 2023-06-11 ENCOUNTER — Ambulatory Visit

## 2023-06-11 ENCOUNTER — Ambulatory Visit: Admitting: Nurse Practitioner

## 2023-06-13 ENCOUNTER — Encounter

## 2023-06-16 ENCOUNTER — Inpatient Hospital Stay

## 2023-06-16 ENCOUNTER — Inpatient Hospital Stay (HOSPITAL_BASED_OUTPATIENT_CLINIC_OR_DEPARTMENT_OTHER): Admitting: Nurse Practitioner

## 2023-06-16 ENCOUNTER — Encounter: Payer: Self-pay | Admitting: Nurse Practitioner

## 2023-06-16 VITALS — BP 127/75 | HR 63 | Temp 98.2°F | Resp 18 | Ht 68.0 in | Wt 159.9 lb

## 2023-06-16 VITALS — BP 148/66 | HR 73 | Temp 98.4°F | Resp 16

## 2023-06-16 DIAGNOSIS — D696 Thrombocytopenia, unspecified: Secondary | ICD-10-CM | POA: Diagnosis not present

## 2023-06-16 DIAGNOSIS — Z95828 Presence of other vascular implants and grafts: Secondary | ICD-10-CM

## 2023-06-16 DIAGNOSIS — C252 Malignant neoplasm of tail of pancreas: Secondary | ICD-10-CM

## 2023-06-16 DIAGNOSIS — I10 Essential (primary) hypertension: Secondary | ICD-10-CM | POA: Diagnosis not present

## 2023-06-16 DIAGNOSIS — C258 Malignant neoplasm of overlapping sites of pancreas: Secondary | ICD-10-CM | POA: Diagnosis not present

## 2023-06-16 DIAGNOSIS — K117 Disturbances of salivary secretion: Secondary | ICD-10-CM | POA: Diagnosis not present

## 2023-06-16 DIAGNOSIS — Z5111 Encounter for antineoplastic chemotherapy: Secondary | ICD-10-CM | POA: Diagnosis not present

## 2023-06-16 LAB — CMP (CANCER CENTER ONLY)
ALT: 25 U/L (ref 0–44)
AST: 22 U/L (ref 15–41)
Albumin: 4.1 g/dL (ref 3.5–5.0)
Alkaline Phosphatase: 76 U/L (ref 38–126)
Anion gap: 9 (ref 5–15)
BUN: 14 mg/dL (ref 8–23)
CO2: 26 mmol/L (ref 22–32)
Calcium: 9.3 mg/dL (ref 8.9–10.3)
Chloride: 104 mmol/L (ref 98–111)
Creatinine: 0.57 mg/dL — ABNORMAL LOW (ref 0.61–1.24)
GFR, Estimated: >60 mL/min (ref >60)
Glucose, Bld: 106 mg/dL — ABNORMAL HIGH (ref 70–99)
Potassium: 4.2 mmol/L (ref 3.5–5.1)
Sodium: 138 mmol/L (ref 135–145)
Total Bilirubin: 0.3 mg/dL (ref 0.0–1.2)
Total Protein: 6.4 g/dL — ABNORMAL LOW (ref 6.5–8.1)

## 2023-06-16 LAB — CBC WITH DIFFERENTIAL (CANCER CENTER ONLY)
Abs Immature Granulocytes: 0.01 10*3/uL (ref 0.00–0.07)
Basophils Absolute: 0.1 10*3/uL (ref 0.0–0.1)
Basophils Relative: 1 %
Eosinophils Absolute: 0.2 10*3/uL (ref 0.0–0.5)
Eosinophils Relative: 4 %
HCT: 34.8 % — ABNORMAL LOW (ref 39.0–52.0)
Hemoglobin: 11 g/dL — ABNORMAL LOW (ref 13.0–17.0)
Immature Granulocytes: 0 %
Lymphocytes Relative: 30 %
Lymphs Abs: 1.2 10*3/uL (ref 0.7–4.0)
MCH: 31.7 pg (ref 26.0–34.0)
MCHC: 31.6 g/dL (ref 30.0–36.0)
MCV: 100.3 fL — ABNORMAL HIGH (ref 80.0–100.0)
Monocytes Absolute: 0.6 10*3/uL (ref 0.1–1.0)
Monocytes Relative: 13 %
Neutro Abs: 2.2 10*3/uL (ref 1.7–7.7)
Neutrophils Relative %: 52 %
Platelet Count: 110 10*3/uL — ABNORMAL LOW (ref 150–400)
RBC: 3.47 MIL/uL — ABNORMAL LOW (ref 4.22–5.81)
RDW: 17.4 % — ABNORMAL HIGH (ref 11.5–15.5)
WBC Count: 4.2 10*3/uL (ref 4.0–10.5)
nRBC: 0 % (ref 0.0–0.2)

## 2023-06-16 MED ORDER — OXALIPLATIN CHEMO INJECTION 100 MG/20ML
45.0000 mg/m2 | Freq: Once | INTRAVENOUS | Status: AC
  Administered 2023-06-16: 80 mg via INTRAVENOUS
  Filled 2023-06-16: qty 16

## 2023-06-16 MED ORDER — SODIUM CHLORIDE 0.9% FLUSH
10.0000 mL | INTRAVENOUS | Status: DC | PRN
Start: 2023-06-16 — End: 2023-06-16

## 2023-06-16 MED ORDER — SODIUM CHLORIDE 0.9% FLUSH
3.0000 mL | INTRAVENOUS | Status: DC | PRN
Start: 2023-06-16 — End: 2023-06-16
  Administered 2023-06-16: 10 mL via INTRAVENOUS

## 2023-06-16 MED ORDER — SODIUM CHLORIDE 0.9% FLUSH
3.0000 mL | Freq: Two times a day (BID) | INTRAVENOUS | Status: DC
Start: 2023-06-16 — End: 2023-06-16

## 2023-06-16 MED ORDER — ATROPINE SULFATE 1 MG/ML IV SOLN
0.5000 mg | Freq: Once | INTRAVENOUS | Status: AC | PRN
  Administered 2023-06-16: 0.5 mg via INTRAVENOUS
  Filled 2023-06-16: qty 1

## 2023-06-16 MED ORDER — DEXAMETHASONE SODIUM PHOSPHATE 10 MG/ML IJ SOLN
10.0000 mg | Freq: Once | INTRAMUSCULAR | Status: AC
  Administered 2023-06-16: 10 mg via INTRAVENOUS
  Filled 2023-06-16: qty 1

## 2023-06-16 MED ORDER — PALONOSETRON HCL INJECTION 0.25 MG/5ML
0.2500 mg | Freq: Once | INTRAVENOUS | Status: AC
  Administered 2023-06-16: 0.25 mg via INTRAVENOUS
  Filled 2023-06-16: qty 5

## 2023-06-16 MED ORDER — LEUCOVORIN CALCIUM INJECTION 350 MG
400.0000 mg/m2 | Freq: Once | INTRAVENOUS | Status: AC
  Administered 2023-06-16: 704 mg via INTRAVENOUS
  Filled 2023-06-16: qty 35.2

## 2023-06-16 MED ORDER — SODIUM CHLORIDE 0.9 % IV SOLN
40.0000 mg/m2 | Freq: Once | INTRAVENOUS | Status: AC
  Administered 2023-06-16: 68.8 mg via INTRAVENOUS
  Filled 2023-06-16: qty 16

## 2023-06-16 MED ORDER — HEPARIN SOD (PORK) LOCK FLUSH 100 UNIT/ML IV SOLN: 500.0000 [IU] | Freq: Once | INTRAVENOUS | Status: DC | PRN

## 2023-06-16 MED ORDER — SODIUM CHLORIDE 0.9 % IV SOLN
2400.0000 mg/m2 | INTRAVENOUS | Status: DC
  Administered 2023-06-16: 4200 mg via INTRAVENOUS
  Filled 2023-06-16: qty 50

## 2023-06-16 MED ORDER — SODIUM CHLORIDE 0.9 % IV SOLN
150.0000 mg | Freq: Once | INTRAVENOUS | Status: AC
Start: 2023-06-16 — End: 2023-06-16
  Administered 2023-06-16: 150 mg via INTRAVENOUS
  Filled 2023-06-16: qty 5

## 2023-06-16 MED ORDER — DEXTROSE 5 % IV SOLN: INTRAVENOUS | Status: DC

## 2023-06-16 NOTE — Patient Instructions (Signed)
 [facility] - A DEPT OF [facility]  Discharge Instructions: Thank you for choosing [facility] to provide your oncology and hematology care.   If you have a lab appointment with the Cancer Center, please go directly to the Cancer Center and check in at the registration area.   Wear comfortable clothing and clothing appropriate for easy access to any Portacath or PICC line.   We strive to give you quality time with your provider. You may need to reschedule your appointment if you arrive late (15 or more minutes).  Arriving late affects you and other patients whose appointments are after yours.  Also, if you miss three or more appointments without notifying the office, you may be dismissed from the clinic at the provider's discretion.      For prescription refill requests, have your pharmacy contact our office and allow 72 hours for refills to be completed.    Today you received the following chemotherapy and/or immunotherapy agents: Irinetecan Liposomal, Oxaliplatin , Leucovorin , and Fluorouracil .      To help prevent nausea and vomiting after your treatment, we encourage you to take your nausea medication as directed.  BELOW ARE SYMPTOMS THAT SHOULD BE REPORTED IMMEDIATELY: *FEVER GREATER THAN 100.4 F (38 C) OR HIGHER *CHILLS OR SWEATING *NAUSEA AND VOMITING THAT IS NOT CONTROLLED WITH YOUR NAUSEA MEDICATION *UNUSUAL SHORTNESS OF BREATH *UNUSUAL BRUISING OR BLEEDING *URINARY PROBLEMS (pain or burning when urinating, or frequent urination) *BOWEL PROBLEMS (unusual diarrhea, constipation, pain near the anus) TENDERNESS IN MOUTH AND THROAT WITH OR WITHOUT PRESENCE OF ULCERS (sore throat, sores in mouth, or a toothache) UNUSUAL RASH, SWELLING OR PAIN  UNUSUAL VAGINAL DISCHARGE OR ITCHING   Items with * indicate a potential emergency and should be followed up as soon as possible or go to the Emergency Department if any problems should  occur.  Please show the CHEMOTHERAPY ALERT CARD or IMMUNOTHERAPY ALERT CARD at check-in to the Emergency Department and triage nurse.  Should you have questions after your visit or need to cancel or reschedule your appointment, please contact [facility] - A DEPT OF [facility]  Dept: [phone]  and follow the prompts.  Office hours are 8:00 a.m. to 4:30 p.m. Monday - Friday. Please note that voicemails left after 4:00 p.m. may not be returned until the following business day.  We are closed weekends and major holidays. You have access to a nurse at all times for urgent questions. Please call the main number to the clinic Dept: [phone] and follow the prompts.   For any non-urgent questions, you may also contact your provider using MyChart. We now offer e-Visits for anyone 12 and older to request care online for non-urgent symptoms. For details visit [email or web address].   Also download the MyChart app! Go to the app store, search "MyChart", open the app, select [place], and log in with your MyChart username and password.  The chemotherapy medication bag should finish at 46 hours, 96 hours, or 7 days. For example, if your pump is scheduled for 46 hours and it was put on at 4:00 p.m., it should finish at 2:00 p.m. the day it is scheduled to come off regardless of your appointment time.     Estimated time to finish at:    If the display on your pump reads "Low Volume" and it is beeping, take the batteries out of the pump and come to the cancer center for it  to be taken off.   If the pump alarms go off prior to the pump reading "Low Volume" then call [phone] and someone can assist you.  If the plunger comes out and the chemotherapy medication is leaking out, please use your home chemo spill kit to clean up the spill. Do NOT use paper towels or other household products.  If you have problems or questions regarding your pump, please call either  [phone] (24 hours a day) or the cancer center Monday-Friday 8:00 a.m.- 4:30 p.m. at the clinic number and we will assist you. If you are unable to get assistance, then go to the nearest Emergency Department and ask the staff to contact the IV team for assistance.

## 2023-06-16 NOTE — Progress Notes (Signed)
 Patient seen by [name] NP today  Vitals are within treatment parameters:Yes   Labs are within treatment parameters: Yes   Treatment plan has been signed: Yes   Per physician team, Patient is ready for treatment and there are NO modifications to the treatment plan.

## 2023-06-16 NOTE — Progress Notes (Signed)
  Ralph Davis   Diagnosis: Pancreas cancer  INTERVAL HISTORY:   Ralph Davis returns as scheduled.  He completed cycle 7 NALIRIFOX 05/28/2023.  He denies nausea/vomiting.  No mouth sores.  No diarrhea.  He did not experience cold sensitivity.  No abdominal pain.  Good appetite.  Objective:  Vital signs in last 24 hours:  Blood pressure 127/75, pulse 63, temperature 98.2 F (36.8 C), resp. rate 18, height 5\' 8"  (1.727 m), weight 159 lb 14.4 oz (72.5 kg), SpO2 100%.    HEENT: No thrush or ulcers. Resp: Lungs clear bilaterally. Cardio: Regular rate and rhythm. GI: No hepatosplenomegaly. Vascular: No leg edema. Skin: Palms without erythema. Port-A-Cath without erythema.  Lab Results:  Lab Results  Component Value Date   WBC 4.2 06/16/2023   HGB 11.0 (L) 06/16/2023   HCT 34.8 (L) 06/16/2023   MCV 100.3 (H) 06/16/2023   PLT 110 (L) 06/16/2023   NEUTROABS 2.2 06/16/2023    Imaging:  No results found.  Medications: I have reviewed the patient's current medications.  Assessment/Plan: Pancreas cancer  bloating/constipation/anorexia and weight loss/back pain EGD 01/20/2023-diffuse moderate mucosal changes characterized by congestion and erythema in the gastric antrum; a large ulcerated and fungating mass with stigmata of recent bleeding in the duodenal bulb; segmental moderate inflammation characterized by congestion and erythema found at the pylorus.  There was no evidence of dysplasia or malignancy in the duodenum biopsies.   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 02/06/2023 CEA and CA 19-9 elevated 02/07/2023 biopsy left abdominal soft tissue implant-metastatic moderately differentiated adenocarcinoma, MSS, tumor mutation burden 4, HRD signature negative, [id number] 02/10/2023 CTs neck and chest-no metastatic disease.  Large cystic appearing pancreatic mass and adjacent cystic/necrotic peritoneal lesions. Cycle 1 NALIRIFOX 02/18/2023 Cycle 2 NALIRIFOX 03/06/2023 Cycle 3 NALIRIFOX 03/18/2023 Cycle 4 NALIRIFOX 04/01/2023 Cycle 5 NALIRIFOX 04/14/2023, doses adjusted for weight loss CT abdomen/pelvis 04/28/2023: Mild decrease in size pancreas body/tail mass and peritoneal implants.  New small volume splenic infarct, fistulous communication between the pancreas body mass and gastric body Cycle 6 NALIRIFOX 05/13/2023, irinotecan  and oxaliplatin  dose reduced Cycle 7 NALIRIFOX 05/28/2023 Cycle 8 NALIRIFOX 06/16/2023 Hypertension Hypersalivation following cycle 3 NALIRIFOX-likely related to irinotecan , improved with scopolamine  patch  Disposition: Mr. Fallone appears stable.  He has completed 7 cycles of NALIRIFOX.  He is tolerating treatment well.  There is no clinical evidence of disease progression.  Plan to proceed with cycle 8 today as scheduled.  CBC and chemistry panel reviewed.  Labs adequate to proceed as above.  He has mild thrombocytopenia.  He will contact the office with bleeding.  Per his request he will return for follow-up and treatment in approximately 4 weeks.  We are available to see him sooner if needed.    Ralph Davis ANP/GNP-BC   06/16/2023  10:30 AM

## 2023-06-18 ENCOUNTER — Inpatient Hospital Stay

## 2023-06-18 DIAGNOSIS — C252 Malignant neoplasm of tail of pancreas: Secondary | ICD-10-CM

## 2023-06-18 DIAGNOSIS — D696 Thrombocytopenia, unspecified: Secondary | ICD-10-CM | POA: Diagnosis not present

## 2023-06-18 DIAGNOSIS — C258 Malignant neoplasm of overlapping sites of pancreas: Secondary | ICD-10-CM | POA: Diagnosis not present

## 2023-06-18 DIAGNOSIS — Z5111 Encounter for antineoplastic chemotherapy: Secondary | ICD-10-CM | POA: Diagnosis not present

## 2023-06-18 DIAGNOSIS — I10 Essential (primary) hypertension: Secondary | ICD-10-CM | POA: Diagnosis not present

## 2023-06-18 DIAGNOSIS — K117 Disturbances of salivary secretion: Secondary | ICD-10-CM | POA: Diagnosis not present

## 2023-06-18 MED ORDER — SODIUM CHLORIDE 0.9% FLUSH: 10.0000 mL | INTRAVENOUS | Status: DC | PRN

## 2023-06-18 MED ORDER — HEPARIN SOD (PORK) LOCK FLUSH 100 UNIT/ML IV SOLN: 500.0000 [IU] | Freq: Once | INTRAVENOUS | Status: DC | PRN

## 2023-06-19 LAB — CANCER ANTIGEN 19-9: CA 19-9: 125 U/mL — ABNORMAL HIGH (ref 0–35)

## 2023-06-25 ENCOUNTER — Other Ambulatory Visit

## 2023-06-25 ENCOUNTER — Ambulatory Visit

## 2023-06-25 ENCOUNTER — Ambulatory Visit: Admitting: Oncology

## 2023-06-25 DIAGNOSIS — H532 Diplopia: Secondary | ICD-10-CM | POA: Diagnosis not present

## 2023-06-26 ENCOUNTER — Other Ambulatory Visit: Payer: Self-pay | Admitting: Optometry

## 2023-06-26 DIAGNOSIS — M316 Other giant cell arteritis: Secondary | ICD-10-CM | POA: Diagnosis not present

## 2023-06-26 DIAGNOSIS — H532 Diplopia: Secondary | ICD-10-CM | POA: Diagnosis not present

## 2023-06-27 ENCOUNTER — Encounter

## 2023-07-07 ENCOUNTER — Other Ambulatory Visit: Payer: Self-pay | Admitting: *Deleted

## 2023-07-07 DIAGNOSIS — C252 Malignant neoplasm of tail of pancreas: Secondary | ICD-10-CM

## 2023-07-07 DIAGNOSIS — E876 Hypokalemia: Secondary | ICD-10-CM

## 2023-07-07 MED ORDER — POTASSIUM CHLORIDE CRYS ER 10 MEQ PO TBCR: 10.0000 meq | EXTENDED_RELEASE_TABLET | Freq: Three times a day (TID) | ORAL | 2 refills | Status: DC

## 2023-07-07 NOTE — Telephone Encounter (Signed)
 Ralph Davis called to request refill on his KCl+. Refill approved.

## 2023-07-08 ENCOUNTER — Ambulatory Visit
Admission: RE | Admit: 2023-07-08 | Discharge: 2023-07-08 | Disposition: A | Discharge status: Home / Self Care | Source: Ambulatory Visit | Attending: Optometry | Admitting: Optometry

## 2023-07-08 ENCOUNTER — Other Ambulatory Visit: Payer: Self-pay

## 2023-07-08 DIAGNOSIS — I6782 Cerebral ischemia: Secondary | ICD-10-CM | POA: Diagnosis not present

## 2023-07-08 DIAGNOSIS — H532 Diplopia: Secondary | ICD-10-CM | POA: Diagnosis not present

## 2023-07-08 DIAGNOSIS — C259 Malignant neoplasm of pancreas, unspecified: Secondary | ICD-10-CM | POA: Diagnosis not present

## 2023-07-08 DIAGNOSIS — G319 Degenerative disease of nervous system, unspecified: Secondary | ICD-10-CM | POA: Diagnosis not present

## 2023-07-08 MED ORDER — GADOPICLENOL 0.5 MMOL/ML IV SOLN
7.0000 mL | Freq: Once | INTRAVENOUS | Status: AC | PRN
  Administered 2023-07-08: 7 mL via INTRAVENOUS

## 2023-07-09 DIAGNOSIS — H532 Diplopia: Secondary | ICD-10-CM | POA: Diagnosis not present

## 2023-07-11 MED FILL — Fluorouracil IV Soln 5 GM/100ML (50 MG/ML): INTRAVENOUS | Qty: 84 | Status: AC

## 2023-07-15 ENCOUNTER — Inpatient Hospital Stay: Attending: Nurse Practitioner

## 2023-07-15 ENCOUNTER — Telehealth: Payer: Self-pay | Admitting: Oncology

## 2023-07-15 ENCOUNTER — Inpatient Hospital Stay

## 2023-07-15 ENCOUNTER — Inpatient Hospital Stay: Admitting: Oncology

## 2023-07-15 VITALS — BP 130/75 | HR 71 | Temp 98.1°F | Resp 18 | Ht 68.0 in | Wt 170.1 lb

## 2023-07-15 DIAGNOSIS — I82890 Acute embolism and thrombosis of other specified veins: Secondary | ICD-10-CM | POA: Diagnosis not present

## 2023-07-15 DIAGNOSIS — I10 Essential (primary) hypertension: Secondary | ICD-10-CM | POA: Insufficient documentation

## 2023-07-15 DIAGNOSIS — C258 Malignant neoplasm of overlapping sites of pancreas: Secondary | ICD-10-CM | POA: Insufficient documentation

## 2023-07-15 DIAGNOSIS — C252 Malignant neoplasm of tail of pancreas: Secondary | ICD-10-CM | POA: Diagnosis not present

## 2023-07-15 DIAGNOSIS — Z5111 Encounter for antineoplastic chemotherapy: Secondary | ICD-10-CM | POA: Insufficient documentation

## 2023-07-15 DIAGNOSIS — K117 Disturbances of salivary secretion: Secondary | ICD-10-CM | POA: Diagnosis not present

## 2023-07-15 DIAGNOSIS — C7989 Secondary malignant neoplasm of other specified sites: Secondary | ICD-10-CM | POA: Diagnosis not present

## 2023-07-15 DIAGNOSIS — Z452 Encounter for adjustment and management of vascular access device: Secondary | ICD-10-CM | POA: Diagnosis not present

## 2023-07-15 DIAGNOSIS — D735 Infarction of spleen: Secondary | ICD-10-CM | POA: Insufficient documentation

## 2023-07-15 LAB — CBC WITH DIFFERENTIAL (CANCER CENTER ONLY)
Abs Immature Granulocytes: 0.03 10*3/uL (ref 0.00–0.07)
Basophils Absolute: 0.1 10*3/uL (ref 0.0–0.1)
Basophils Relative: 1 %
Eosinophils Absolute: 0.1 10*3/uL (ref 0.0–0.5)
Eosinophils Relative: 2 %
HCT: 37.7 % — ABNORMAL LOW (ref 39.0–52.0)
Hemoglobin: 12 g/dL — ABNORMAL LOW (ref 13.0–17.0)
Immature Granulocytes: 1 %
Lymphocytes Relative: 19 %
Lymphs Abs: 1.2 10*3/uL (ref 0.7–4.0)
MCH: 30.8 pg (ref 26.0–34.0)
MCHC: 31.8 g/dL (ref 30.0–36.0)
MCV: 96.9 fL (ref 80.0–100.0)
Monocytes Absolute: 0.5 10*3/uL (ref 0.1–1.0)
Monocytes Relative: 8 %
Neutro Abs: 4.5 10*3/uL (ref 1.7–7.7)
Neutrophils Relative %: 69 %
Platelet Count: 114 10*3/uL — ABNORMAL LOW (ref 150–400)
RBC: 3.89 MIL/uL — ABNORMAL LOW (ref 4.22–5.81)
RDW: 14.8 % (ref 11.5–15.5)
WBC Count: 6.4 10*3/uL (ref 4.0–10.5)
nRBC: 0 % (ref 0.0–0.2)

## 2023-07-15 LAB — CMP (CANCER CENTER ONLY)
ALT: 12 U/L (ref 0–44)
AST: 17 U/L (ref 15–41)
Albumin: 3.8 g/dL (ref 3.5–5.0)
Alkaline Phosphatase: 95 U/L (ref 38–126)
Anion gap: 11 (ref 5–15)
BUN: 11 mg/dL (ref 8–23)
CO2: 25 mmol/L (ref 22–32)
Calcium: 9.4 mg/dL (ref 8.9–10.3)
Chloride: 104 mmol/L (ref 98–111)
Creatinine: 0.55 mg/dL — ABNORMAL LOW (ref 0.61–1.24)
GFR, Estimated: >60 mL/min (ref >60)
Glucose, Bld: 97 mg/dL (ref 70–99)
Potassium: 4.1 mmol/L (ref 3.5–5.1)
Sodium: 140 mmol/L (ref 135–145)
Total Bilirubin: 0.2 mg/dL (ref 0.0–1.2)
Total Protein: 7 g/dL (ref 6.5–8.1)

## 2023-07-15 MED ORDER — DEXAMETHASONE SODIUM PHOSPHATE 10 MG/ML IJ SOLN
10.0000 mg | Freq: Once | INTRAMUSCULAR | Status: AC
  Administered 2023-07-15: 10 mg via INTRAVENOUS
  Filled 2023-07-15: qty 1

## 2023-07-15 MED ORDER — SODIUM CHLORIDE 0.9 % IV SOLN
2400.0000 mg/m2 | INTRAVENOUS | Status: DC
  Administered 2023-07-15: 4200 mg via INTRAVENOUS
  Filled 2023-07-15: qty 84

## 2023-07-15 MED ORDER — PALONOSETRON HCL INJECTION 0.25 MG/5ML
0.2500 mg | Freq: Once | INTRAVENOUS | Status: AC
  Administered 2023-07-15: 0.25 mg via INTRAVENOUS
  Filled 2023-07-15: qty 5

## 2023-07-15 MED ORDER — DEXTROSE 5 % IV SOLN: INTRAVENOUS | Status: DC

## 2023-07-15 MED ORDER — SODIUM CHLORIDE 0.9 % IV SOLN
40.0000 mg/m2 | Freq: Once | INTRAVENOUS | Status: AC
  Administered 2023-07-15: 68.8 mg via INTRAVENOUS
  Filled 2023-07-15: qty 16

## 2023-07-15 MED ORDER — OXALIPLATIN CHEMO INJECTION 100 MG/20ML
45.0000 mg/m2 | Freq: Once | INTRAVENOUS | Status: AC
  Administered 2023-07-15: 80 mg via INTRAVENOUS
  Filled 2023-07-15: qty 16

## 2023-07-15 MED ORDER — SODIUM CHLORIDE 0.9 % IV SOLN
150.0000 mg | Freq: Once | INTRAVENOUS | Status: AC
  Administered 2023-07-15: 150 mg via INTRAVENOUS
  Filled 2023-07-15: qty 5

## 2023-07-15 MED ORDER — ATROPINE SULFATE 1 MG/ML IV SOLN
0.5000 mg | Freq: Once | INTRAVENOUS | Status: AC | PRN
  Administered 2023-07-15: 0.5 mg via INTRAVENOUS
  Filled 2023-07-15: qty 1

## 2023-07-15 MED ORDER — LEUCOVORIN CALCIUM INJECTION 350 MG
400.0000 mg/m2 | Freq: Once | INTRAVENOUS | Status: AC
  Administered 2023-07-15: 704 mg via INTRAVENOUS
  Filled 2023-07-15: qty 35.2

## 2023-07-15 NOTE — Progress Notes (Signed)
 Patient seen by Dr. Thornton Papas today  Vitals are within treatment parameters:Yes   Labs are within treatment parameters: Yes   Treatment plan has been signed: Yes   Per physician team, Patient is ready for treatment and there are NO modifications to the treatment plan.

## 2023-07-15 NOTE — Progress Notes (Signed)
 Md aware of weight increase and intends to keep current doses off 1.76 mg/m2 BSA

## 2023-07-15 NOTE — Telephone Encounter (Signed)
 Patient has been scheduled for follow-up visit per 07/07/23 LOS.  Pt noted appt details on personal electronic device.

## 2023-07-15 NOTE — Progress Notes (Signed)
 [facility] OFFICE PROGRESS NOTE   Diagnosis: Pancreas cancer  INTERVAL HISTORY:   [name] completed another cycle of NALIRIFOX on 06/16/2023.  No nausea/vomiting, mouth sores, diarrhea, cold sensitivity, or peripheral numbness.  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  This has resolved.  No abdominal pain.  Good appetite.  He is exercising. He reports diplopia was corrected when his eyeglasses were adjusted by his ophthalmologist.  He is scheduled for a brain MRI. Objective:  Vital signs in last 24 hours:  Blood pressure 130/75, pulse 71, temperature 98.1 F (36.7 C), temperature source Temporal, resp. rate 18, height 5\' 8"  (1.727 m), weight 170 lb 1.6 oz (77.2 kg), SpO2 100%.    HEENT: No thrush or ulcers Resp: Lungs clear bilaterally Cardio: Regular rate and rhythm GI: No mass, no hepatosplenomegaly, nontender Vascular: No leg edema Neuro: Mild loss of vibratory sense at the left fingertips, moderate loss of vibratory sense at the right fingertips    Portacath/PICC-without erythema  Lab Results:  Lab Results  Component Value Date   WBC 6.4 07/15/2023   HGB 12.0 (L) 07/15/2023   HCT 37.7 (L) 07/15/2023   MCV 96.9 07/15/2023   PLT 114 (L) 07/15/2023   NEUTROABS 4.5 07/15/2023    CMP  Lab Results  Component Value Date   NA 138 06/16/2023   K 4.2 06/16/2023   CL 104 06/16/2023   CO2 26 06/16/2023   GLUCOSE 106 (H) 06/16/2023   BUN 14 06/16/2023   CREATININE 0.57 (L) 06/16/2023   CALCIUM  9.3 06/16/2023   PROT 6.4 (L) 06/16/2023   ALBUMIN  4.1 06/16/2023   AST 22 06/16/2023   ALT 25 06/16/2023   ALKPHOS 76 06/16/2023   BILITOT 0.3 06/16/2023   GFRNONAA >60 06/16/2023   GFRAA >60 05/08/2019    Lab Results  Component Value Date   CEA 75.60 (H) 02/06/2023   CAN199 125 (H) 06/18/2023    Lab Results  Component Value Date   INR 1.1 02/07/2023   LABPROT 14.7 02/07/2023    Imaging:  No  results found.  Medications: I have reviewed the patient's current medications.   Assessment/Plan:  Pancreas cancer  bloating/constipation/anorexia and weight loss/back pain EGD 01/20/2023-diffuse moderate mucosal changes characterized by congestion and erythema in the gastric antrum; a large ulcerated and fungating mass with stigmata of recent bleeding in the duodenal bulb; segmental moderate inflammation characterized by congestion and erythema found at the pylorus.  There was no evidence of dysplasia or malignancy in the duodenum biopsies.   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 02/06/2023 CEA and CA 19-9 elevated 02/07/2023 biopsy left abdominal soft tissue implant-metastatic moderately differentiated adenocarcinoma, MSS, tumor mutation burden 4, HRD signature negative, [id number] 02/10/2023 CTs neck and chest-no metastatic disease.  Large cystic appearing pancreatic mass and adjacent cystic/necrotic peritoneal lesions. Cycle 1 NALIRIFOX 02/18/2023 Cycle 2 NALIRIFOX 03/06/2023 Cycle 3 NALIRIFOX 03/18/2023 Cycle 4 NALIRIFOX 04/01/2023 Cycle 5 NALIRIFOX 04/14/2023, doses adjusted for weight loss CT abdomen/pelvis 04/28/2023: Mild decrease in size pancreas body/tail  mass and peritoneal implants.  New small volume splenic infarct, fistulous communication between the pancreas body mass and gastric body Cycle 6 NALIRIFOX 05/13/2023, irinotecan  and oxaliplatin  dose reduced Cycle 7 NALIRIFOX  05/28/2023 Cycle 8 NALIRIFOX 06/16/2023 Cycle 9 NALIRIFOX 2725 Hypertension Hypersalivation following cycle 3 NALIRIFOX-likely related to irinotecan , improved with scopolamine  patch   Disposition: [name] has metastatic pancreas cancer.  He has completed 8 cycles of NALIRIFOX.  He is tolerating the treatment well and he has experienced marked clinical and CA 19-9 improvement.  He will complete another cycle today.  He would like to continue treatment on an 4-week schedule.  He will return for an office visit and chemotherapy in 4 weeks.  Coni Deep, MD  07/15/2023  9:45 AM

## 2023-07-15 NOTE — Patient Instructions (Signed)
 [facility] - A DEPT OF [facility]  Discharge Instructions: Thank you for choosing [facility] to provide your oncology and hematology care.   If you have a lab appointment with the Cancer Center, please go directly to the Cancer Center and check in at the registration area.   Wear comfortable clothing and clothing appropriate for easy access to any Portacath or PICC line.   We strive to give you quality time with your provider. You may need to reschedule your appointment if you arrive late (15 or more minutes).  Arriving late affects you and other patients whose appointments are after yours.  Also, if you miss three or more appointments without notifying the office, you may be dismissed from the clinic at the provider's discretion.      For prescription refill requests, have your pharmacy contact our office and allow 72 hours for refills to be completed.    Today you received the following chemotherapy and/or immunotherapy agents: Irinetecan Liposomal, Oxaliplatin , Leucovorin , and Fluorouracil .      To help prevent nausea and vomiting after your treatment, we encourage you to take your nausea medication as directed.  BELOW ARE SYMPTOMS THAT SHOULD BE REPORTED IMMEDIATELY: *FEVER GREATER THAN 100.4 F (38 C) OR HIGHER *CHILLS OR SWEATING *NAUSEA AND VOMITING THAT IS NOT CONTROLLED WITH YOUR NAUSEA MEDICATION *UNUSUAL SHORTNESS OF BREATH *UNUSUAL BRUISING OR BLEEDING *URINARY PROBLEMS (pain or burning when urinating, or frequent urination) *BOWEL PROBLEMS (unusual diarrhea, constipation, pain near the anus) TENDERNESS IN MOUTH AND THROAT WITH OR WITHOUT PRESENCE OF ULCERS (sore throat, sores in mouth, or a toothache) UNUSUAL RASH, SWELLING OR PAIN  UNUSUAL VAGINAL DISCHARGE OR ITCHING   Items with * indicate a potential emergency and should be followed up as soon as possible or go to the Emergency Department if any problems should  occur.  Please show the CHEMOTHERAPY ALERT CARD or IMMUNOTHERAPY ALERT CARD at check-in to the Emergency Department and triage nurse.  Should you have questions after your visit or need to cancel or reschedule your appointment, please contact [facility] - A DEPT OF [facility]  Dept: [phone]  and follow the prompts.  Office hours are 8:00 a.m. to 4:30 p.m. Monday - Friday. Please note that voicemails left after 4:00 p.m. may not be returned until the following business day.  We are closed weekends and major holidays. You have access to a nurse at all times for urgent questions. Please call the main number to the clinic Dept: [phone] and follow the prompts.   For any non-urgent questions, you may also contact your provider using MyChart. We now offer e-Visits for anyone 12 and older to request care online for non-urgent symptoms. For details visit [email or web address].   Also download the MyChart app! Go to the app store, search "MyChart", open the app, select [place], and log in with your MyChart username and password.  The chemotherapy medication bag should finish at 46 hours, 96 hours, or 7 days. For example, if your pump is scheduled for 46 hours and it was put on at 4:00 p.m., it should finish at 2:00 p.m. the day it is scheduled to come off regardless of your appointment time.     Estimated time to finish at:    If the display on your pump reads "Low Volume" and it is beeping, take the batteries out of the pump and come to the cancer center for it  to be taken off.   If the pump alarms go off prior to the pump reading "Low Volume" then call [phone] and someone can assist you.  If the plunger comes out and the chemotherapy medication is leaking out, please use your home chemo spill kit to clean up the spill. Do NOT use paper towels or other household products.  If you have problems or questions regarding your pump, please call either  [phone] (24 hours a day) or the cancer center Monday-Friday 8:00 a.m.- 4:30 p.m. at the clinic number and we will assist you. If you are unable to get assistance, then go to the nearest Emergency Department and ask the staff to contact the IV team for assistance.

## 2023-07-16 ENCOUNTER — Other Ambulatory Visit: Payer: Self-pay

## 2023-07-16 LAB — CANCER ANTIGEN 19-9: CA 19-9: 76 U/mL — ABNORMAL HIGH (ref 0–35)

## 2023-07-17 ENCOUNTER — Inpatient Hospital Stay

## 2023-07-17 VITALS — BP 132/76 | HR 70 | Temp 98.3°F | Resp 18

## 2023-07-17 DIAGNOSIS — Z5111 Encounter for antineoplastic chemotherapy: Secondary | ICD-10-CM | POA: Diagnosis not present

## 2023-07-17 DIAGNOSIS — C258 Malignant neoplasm of overlapping sites of pancreas: Secondary | ICD-10-CM | POA: Diagnosis not present

## 2023-07-17 DIAGNOSIS — K117 Disturbances of salivary secretion: Secondary | ICD-10-CM | POA: Diagnosis not present

## 2023-07-17 DIAGNOSIS — I82890 Acute embolism and thrombosis of other specified veins: Secondary | ICD-10-CM | POA: Diagnosis not present

## 2023-07-17 DIAGNOSIS — C7989 Secondary malignant neoplasm of other specified sites: Secondary | ICD-10-CM | POA: Diagnosis not present

## 2023-07-17 DIAGNOSIS — Z452 Encounter for adjustment and management of vascular access device: Secondary | ICD-10-CM | POA: Diagnosis not present

## 2023-07-17 DIAGNOSIS — C252 Malignant neoplasm of tail of pancreas: Secondary | ICD-10-CM

## 2023-07-17 DIAGNOSIS — I10 Essential (primary) hypertension: Secondary | ICD-10-CM | POA: Diagnosis not present

## 2023-07-17 DIAGNOSIS — D735 Infarction of spleen: Secondary | ICD-10-CM | POA: Diagnosis not present

## 2023-07-17 MED ORDER — HEPARIN SOD (PORK) LOCK FLUSH 100 UNIT/ML IV SOLN
500.0000 [IU] | Freq: Once | INTRAVENOUS | Status: AC | PRN
  Administered 2023-07-17: 500 [IU]

## 2023-07-17 MED ORDER — SODIUM CHLORIDE 0.9% FLUSH
10.0000 mL | INTRAVENOUS | Status: DC | PRN
  Administered 2023-07-17: 10 mL

## 2023-07-22 ENCOUNTER — Other Ambulatory Visit: Payer: Self-pay

## 2023-07-23 DIAGNOSIS — H532 Diplopia: Secondary | ICD-10-CM | POA: Diagnosis not present

## 2023-07-25 ENCOUNTER — Other Ambulatory Visit: Payer: Self-pay

## 2023-07-29 ENCOUNTER — Other Ambulatory Visit

## 2023-07-29 ENCOUNTER — Ambulatory Visit

## 2023-07-29 ENCOUNTER — Ambulatory Visit: Admitting: Nurse Practitioner

## 2023-07-31 ENCOUNTER — Encounter

## 2023-08-10 ENCOUNTER — Other Ambulatory Visit: Payer: Self-pay | Admitting: Oncology

## 2023-08-10 DIAGNOSIS — C252 Malignant neoplasm of tail of pancreas: Secondary | ICD-10-CM

## 2023-08-12 ENCOUNTER — Other Ambulatory Visit

## 2023-08-12 ENCOUNTER — Inpatient Hospital Stay: Attending: Nurse Practitioner

## 2023-08-12 ENCOUNTER — Inpatient Hospital Stay

## 2023-08-12 ENCOUNTER — Inpatient Hospital Stay (HOSPITAL_BASED_OUTPATIENT_CLINIC_OR_DEPARTMENT_OTHER): Admitting: Nurse Practitioner

## 2023-08-12 ENCOUNTER — Encounter: Payer: Self-pay | Admitting: Nurse Practitioner

## 2023-08-12 VITALS — BP 132/79 | HR 76 | Temp 98.2°F | Resp 18 | Ht 68.0 in | Wt 170.2 lb

## 2023-08-12 DIAGNOSIS — Z452 Encounter for adjustment and management of vascular access device: Secondary | ICD-10-CM | POA: Insufficient documentation

## 2023-08-12 DIAGNOSIS — K117 Disturbances of salivary secretion: Secondary | ICD-10-CM | POA: Diagnosis not present

## 2023-08-12 DIAGNOSIS — C252 Malignant neoplasm of tail of pancreas: Secondary | ICD-10-CM | POA: Diagnosis not present

## 2023-08-12 DIAGNOSIS — I10 Essential (primary) hypertension: Secondary | ICD-10-CM | POA: Insufficient documentation

## 2023-08-12 DIAGNOSIS — Z95828 Presence of other vascular implants and grafts: Secondary | ICD-10-CM

## 2023-08-12 DIAGNOSIS — C258 Malignant neoplasm of overlapping sites of pancreas: Secondary | ICD-10-CM | POA: Insufficient documentation

## 2023-08-12 DIAGNOSIS — Z5111 Encounter for antineoplastic chemotherapy: Secondary | ICD-10-CM | POA: Diagnosis not present

## 2023-08-12 LAB — CMP (CANCER CENTER ONLY)
ALT: 11 U/L (ref 0–44)
AST: 16 U/L (ref 15–41)
Albumin: 3.4 g/dL — ABNORMAL LOW (ref 3.5–5.0)
Alkaline Phosphatase: 93 U/L (ref 38–126)
Anion gap: 10 (ref 5–15)
BUN: 13 mg/dL (ref 8–23)
CO2: 23 mmol/L (ref 22–32)
Calcium: 9.1 mg/dL (ref 8.9–10.3)
Chloride: 106 mmol/L (ref 98–111)
Creatinine: 0.62 mg/dL (ref 0.61–1.24)
GFR, Estimated: >60 mL/min (ref >60)
Glucose, Bld: 137 mg/dL — ABNORMAL HIGH (ref 70–99)
Potassium: 4.2 mmol/L (ref 3.5–5.1)
Sodium: 139 mmol/L (ref 135–145)
Total Bilirubin: 0.2 mg/dL (ref 0.0–1.2)
Total Protein: 6.9 g/dL (ref 6.5–8.1)

## 2023-08-12 LAB — CBC WITH DIFFERENTIAL (CANCER CENTER ONLY)
Abs Immature Granulocytes: 0.02 10*3/uL (ref 0.00–0.07)
Basophils Absolute: 0.1 10*3/uL (ref 0.0–0.1)
Basophils Relative: 1 %
Eosinophils Absolute: 0.1 10*3/uL (ref 0.0–0.5)
Eosinophils Relative: 1 %
HCT: 36.7 % — ABNORMAL LOW (ref 39.0–52.0)
Hemoglobin: 11.7 g/dL — ABNORMAL LOW (ref 13.0–17.0)
Immature Granulocytes: 0 %
Lymphocytes Relative: 17 %
Lymphs Abs: 1.3 10*3/uL (ref 0.7–4.0)
MCH: 29.5 pg (ref 26.0–34.0)
MCHC: 31.9 g/dL (ref 30.0–36.0)
MCV: 92.4 fL (ref 80.0–100.0)
Monocytes Absolute: 0.6 10*3/uL (ref 0.1–1.0)
Monocytes Relative: 8 %
Neutro Abs: 5.7 10*3/uL (ref 1.7–7.7)
Neutrophils Relative %: 73 %
Platelet Count: 143 10*3/uL — ABNORMAL LOW (ref 150–400)
RBC: 3.97 MIL/uL — ABNORMAL LOW (ref 4.22–5.81)
RDW: 14.8 % (ref 11.5–15.5)
WBC Count: 7.8 10*3/uL (ref 4.0–10.5)
nRBC: 0 % (ref 0.0–0.2)

## 2023-08-12 MED ORDER — SODIUM CHLORIDE 0.9% FLUSH
10.0000 mL | Freq: Once | INTRAVENOUS | Status: AC
  Administered 2023-08-12: 10 mL

## 2023-08-12 MED ORDER — SODIUM CHLORIDE 0.9 % IV SOLN
150.0000 mg | Freq: Once | INTRAVENOUS | Status: AC
  Administered 2023-08-12: 150 mg via INTRAVENOUS
  Filled 2023-08-12: qty 150

## 2023-08-12 MED ORDER — SODIUM CHLORIDE 0.9 % IV SOLN
40.0000 mg/m2 | Freq: Once | INTRAVENOUS | Status: AC
  Administered 2023-08-12: 68.8 mg via INTRAVENOUS
  Filled 2023-08-12: qty 16

## 2023-08-12 MED ORDER — PALONOSETRON HCL INJECTION 0.25 MG/5ML
0.2500 mg | Freq: Once | INTRAVENOUS | Status: AC
  Administered 2023-08-12: 0.25 mg via INTRAVENOUS
  Filled 2023-08-12: qty 5

## 2023-08-12 MED ORDER — LEUCOVORIN CALCIUM INJECTION 350 MG
400.0000 mg/m2 | Freq: Once | INTRAVENOUS | Status: AC
  Administered 2023-08-12: 704 mg via INTRAVENOUS
  Filled 2023-08-12: qty 35.2

## 2023-08-12 MED ORDER — SODIUM CHLORIDE 0.9 % IV SOLN
2400.0000 mg/m2 | INTRAVENOUS | Status: DC
  Administered 2023-08-12: 4200 mg via INTRAVENOUS
  Filled 2023-08-12: qty 84

## 2023-08-12 MED ORDER — ATROPINE SULFATE 1 MG/ML IV SOLN
0.5000 mg | Freq: Once | INTRAVENOUS | Status: AC | PRN
  Administered 2023-08-12: 0.5 mg via INTRAVENOUS
  Filled 2023-08-12: qty 1

## 2023-08-12 MED ORDER — DEXTROSE 5 % IV SOLN: INTRAVENOUS | Status: DC

## 2023-08-12 MED ORDER — OXALIPLATIN CHEMO INJECTION 100 MG/20ML
45.0000 mg/m2 | Freq: Once | INTRAVENOUS | Status: AC
  Administered 2023-08-12: 80 mg via INTRAVENOUS
  Filled 2023-08-12: qty 16

## 2023-08-12 MED ORDER — DEXAMETHASONE SODIUM PHOSPHATE 10 MG/ML IJ SOLN
10.0000 mg | Freq: Once | INTRAMUSCULAR | Status: AC
  Administered 2023-08-12: 10 mg via INTRAVENOUS
  Filled 2023-08-12: qty 1

## 2023-08-12 NOTE — Progress Notes (Signed)
 Patient seen by [name] NP today  Vitals are within treatment parameters:Yes   Labs are within treatment parameters: Yes   Treatment plan has been signed: Yes   Per physician team, Patient is ready for treatment and there are NO modifications to the treatment plan.

## 2023-08-12 NOTE — Patient Instructions (Signed)

## 2023-08-12 NOTE — Progress Notes (Signed)
 [facility] OFFICE PROGRESS NOTE   Diagnosis: Pancreas cancer  INTERVAL HISTORY:   Ralph Davis returns as scheduled.  He completed another cycle of NALIRIFOX 07/15/2023.  He denies nausea/vomiting.  No mouth sores.  No diarrhea.  No increase in oral secretions.  No significant cold sensitivity.  No numbness or tingling in the absence of cold exposure.  He notes fatigue for a few days following each treatment.  He had intermittent right sided abdominal pain 1 to 2 weeks ago.  This lasted for 5 to 7 days, now resolved.  Objective:  Vital signs in last 24 hours:  Blood pressure 132/79, pulse 76, temperature 98.2 F (36.8 C), temperature source Temporal, resp. rate 18, height 5' 8 (1.727 m), weight 170 lb 3.2 oz (77.2 kg), SpO2 100%.    HEENT: No thrush or ulcers. Resp: Lungs clear bilaterally. Cardio: Regular rate and rhythm. GI: No hepatosplenomegaly.  No mass.  Nontender. Vascular: No leg edema. Skin: Palms without erythema. Port-A-Cath without erythema.  Lab Results:  Lab Results  Component Value Date   WBC 7.8 08/12/2023   HGB 11.7 (L) 08/12/2023   HCT 36.7 (L) 08/12/2023   MCV 92.4 08/12/2023   PLT 143 (L) 08/12/2023   NEUTROABS 5.7 08/12/2023    Imaging:  No results found.  Medications: I have reviewed the patient's current medications.  Assessment/Plan: Pancreas cancer  bloating/constipation/anorexia and weight loss/back pain EGD 01/20/2023-diffuse moderate mucosal changes characterized by congestion and erythema in the gastric antrum; a large ulcerated and fungating mass with stigmata of recent bleeding in the duodenal bulb; segmental moderate inflammation characterized by congestion and erythema found at the pylorus.  There was no evidence of dysplasia or malignancy in the duodenum biopsies.   CT abdomen/pelvis 01/28/2023-necrotic mass with peripheral enhancement involving the pancreatic body and tail measuring 10.1 x 6.3 cm; mass encasing the  splenic artery and causes splenic vein thrombosis; no evidence of vascular involvement of the celiac axis, superior mesenteric artery or veins or portal vein; necrotic peripancreatic lymph node seen along the anterior aspect of the pancreatic body measuring 2.4 x 1.9 cm; multiple left upper quadrant necrotic masses seen in the gastrosplenic ligament and adjacent to the splenic flexure of the colon. 02/06/2023 CEA and CA 19-9 elevated 02/07/2023 biopsy left abdominal soft tissue implant-metastatic moderately differentiated adenocarcinoma, MSS, tumor mutation burden 4, HRD signature negative, [id number] 02/10/2023 CTs neck and chest-no metastatic disease.  Large cystic appearing pancreatic mass and adjacent cystic/necrotic peritoneal lesions. Cycle 1 NALIRIFOX 02/18/2023 Cycle 2 NALIRIFOX 03/06/2023 Cycle 3 NALIRIFOX 03/18/2023 Cycle 4 NALIRIFOX 04/01/2023 Cycle 5 NALIRIFOX 04/14/2023, doses adjusted for weight loss CT abdomen/pelvis 04/28/2023: Mild decrease in size pancreas body/tail mass and peritoneal implants.  New small volume splenic infarct, fistulous communication between the pancreas body mass and gastric body Cycle 6 NALIRIFOX 05/13/2023, irinotecan  and oxaliplatin  dose reduced Cycle 7 NALIRIFOX 05/28/2023 Cycle 8 NALIRIFOX 06/16/2023 Cycle 9 NALIRIFOX 07/15/2023 Cycle 10 NALIRIFOX 08/12/2023 Hypertension Hypersalivation following cycle 3 NALIRIFOX-likely related to irinotecan , improved with scopolamine  patch  Disposition: Ralph Davis appears stable.  He has completed 9 cycles of NALIRIFOX.  He is tolerating treatment well.  There is no clinical evidence of disease progression.  Most recent CA 19-9 further improved.  Plan to proceed with cycle 10 today as scheduled.  Restaging CTs prior to next office visit.  CBC and chemistry panel reviewed.  Labs adequate for treatment.  He will return for follow-up and treatment in 4 weeks.    Ralph Davis ANP/GNP-BC  08/12/2023  10:23 AM

## 2023-08-13 LAB — CANCER ANTIGEN 19-9: CA 19-9: 101 U/mL — ABNORMAL HIGH (ref 0–35)

## 2023-08-14 ENCOUNTER — Inpatient Hospital Stay

## 2023-08-14 VITALS — BP 147/79 | HR 67 | Temp 98.0°F | Resp 18

## 2023-08-14 DIAGNOSIS — I10 Essential (primary) hypertension: Secondary | ICD-10-CM | POA: Diagnosis not present

## 2023-08-14 DIAGNOSIS — C258 Malignant neoplasm of overlapping sites of pancreas: Secondary | ICD-10-CM | POA: Diagnosis not present

## 2023-08-14 DIAGNOSIS — C252 Malignant neoplasm of tail of pancreas: Secondary | ICD-10-CM

## 2023-08-14 DIAGNOSIS — Z452 Encounter for adjustment and management of vascular access device: Secondary | ICD-10-CM | POA: Diagnosis not present

## 2023-08-14 DIAGNOSIS — K117 Disturbances of salivary secretion: Secondary | ICD-10-CM | POA: Diagnosis not present

## 2023-08-14 DIAGNOSIS — Z5111 Encounter for antineoplastic chemotherapy: Secondary | ICD-10-CM | POA: Diagnosis not present

## 2023-08-14 MED ORDER — HEPARIN SOD (PORK) LOCK FLUSH 100 UNIT/ML IV SOLN
500.0000 [IU] | Freq: Once | INTRAVENOUS | Status: AC | PRN
  Administered 2023-08-14: 500 [IU]

## 2023-08-14 MED ORDER — SODIUM CHLORIDE 0.9% FLUSH
10.0000 mL | INTRAVENOUS | Status: DC | PRN
Start: 2023-08-14 — End: 2023-08-14
  Administered 2023-08-14: 10 mL

## 2023-08-14 NOTE — Patient Instructions (Signed)

## 2023-08-21 ENCOUNTER — Other Ambulatory Visit: Payer: Self-pay | Admitting: Oncology

## 2023-09-02 ENCOUNTER — Ambulatory Visit (HOSPITAL_BASED_OUTPATIENT_CLINIC_OR_DEPARTMENT_OTHER)
Admission: RE | Admit: 2023-09-02 | Discharge: 2023-09-02 | Disposition: A | Discharge status: Home / Self Care | Source: Ambulatory Visit | Attending: Nurse Practitioner | Admitting: Nurse Practitioner

## 2023-09-02 ENCOUNTER — Inpatient Hospital Stay: Attending: Nurse Practitioner

## 2023-09-02 VITALS — BP 152/82 | HR 80 | Temp 97.9°F | Resp 18

## 2023-09-02 DIAGNOSIS — D696 Thrombocytopenia, unspecified: Secondary | ICD-10-CM | POA: Insufficient documentation

## 2023-09-02 DIAGNOSIS — C258 Malignant neoplasm of overlapping sites of pancreas: Secondary | ICD-10-CM | POA: Insufficient documentation

## 2023-09-02 DIAGNOSIS — C259 Malignant neoplasm of pancreas, unspecified: Secondary | ICD-10-CM | POA: Diagnosis not present

## 2023-09-02 DIAGNOSIS — Z5111 Encounter for antineoplastic chemotherapy: Secondary | ICD-10-CM | POA: Insufficient documentation

## 2023-09-02 DIAGNOSIS — I10 Essential (primary) hypertension: Secondary | ICD-10-CM | POA: Insufficient documentation

## 2023-09-02 DIAGNOSIS — Z452 Encounter for adjustment and management of vascular access device: Secondary | ICD-10-CM | POA: Insufficient documentation

## 2023-09-02 DIAGNOSIS — C252 Malignant neoplasm of tail of pancreas: Secondary | ICD-10-CM | POA: Insufficient documentation

## 2023-09-02 DIAGNOSIS — G620 Drug-induced polyneuropathy: Secondary | ICD-10-CM | POA: Insufficient documentation

## 2023-09-02 DIAGNOSIS — Z95828 Presence of other vascular implants and grafts: Secondary | ICD-10-CM

## 2023-09-02 DIAGNOSIS — K8689 Other specified diseases of pancreas: Secondary | ICD-10-CM | POA: Diagnosis not present

## 2023-09-02 DIAGNOSIS — K7689 Other specified diseases of liver: Secondary | ICD-10-CM | POA: Diagnosis not present

## 2023-09-02 MED ORDER — HEPARIN SOD (PORK) LOCK FLUSH 100 UNIT/ML IV SOLN
500.0000 [IU] | Freq: Once | INTRAVENOUS | Status: AC
  Administered 2023-09-02: 500 [IU] via INTRAVENOUS

## 2023-09-02 MED ORDER — SODIUM CHLORIDE 0.9% FLUSH
10.0000 mL | INTRAVENOUS | Status: AC | PRN
  Administered 2023-09-02: 10 mL

## 2023-09-02 MED ORDER — IOHEXOL 300 MG/ML  SOLN
100.0000 mL | Freq: Once | INTRAMUSCULAR | Status: AC | PRN
  Administered 2023-09-02: 100 mL via INTRAVENOUS

## 2023-09-02 NOTE — Patient Instructions (Signed)

## 2023-09-02 NOTE — Progress Notes (Signed)
 Patient presents today for Power port access for CT. Patients port flushed without difficulty.  Good blood return noted with no bruising or swelling noted at site. Patient to be de-accessed in CT, verbalized understanding.

## 2023-09-03 ENCOUNTER — Telehealth: Payer: Self-pay | Admitting: Oncology

## 2023-09-03 NOTE — Telephone Encounter (Signed)
 Scheduling 10/07/2023 series appt

## 2023-09-04 ENCOUNTER — Other Ambulatory Visit: Payer: Self-pay

## 2023-09-05 ENCOUNTER — Other Ambulatory Visit: Payer: Self-pay | Admitting: Oncology

## 2023-09-06 ENCOUNTER — Other Ambulatory Visit: Payer: Self-pay

## 2023-09-09 ENCOUNTER — Inpatient Hospital Stay

## 2023-09-09 ENCOUNTER — Inpatient Hospital Stay: Admitting: Oncology

## 2023-09-09 VITALS — BP 135/67 | HR 91 | Temp 98.4°F | Resp 18 | Ht 68.0 in | Wt 166.6 lb

## 2023-09-09 VITALS — BP 121/70 | HR 78 | Temp 98.3°F | Resp 16

## 2023-09-09 DIAGNOSIS — Z452 Encounter for adjustment and management of vascular access device: Secondary | ICD-10-CM | POA: Diagnosis not present

## 2023-09-09 DIAGNOSIS — D696 Thrombocytopenia, unspecified: Secondary | ICD-10-CM | POA: Diagnosis not present

## 2023-09-09 DIAGNOSIS — Z5111 Encounter for antineoplastic chemotherapy: Secondary | ICD-10-CM | POA: Diagnosis not present

## 2023-09-09 DIAGNOSIS — C252 Malignant neoplasm of tail of pancreas: Secondary | ICD-10-CM

## 2023-09-09 DIAGNOSIS — C258 Malignant neoplasm of overlapping sites of pancreas: Secondary | ICD-10-CM | POA: Diagnosis not present

## 2023-09-09 DIAGNOSIS — G620 Drug-induced polyneuropathy: Secondary | ICD-10-CM | POA: Diagnosis not present

## 2023-09-09 DIAGNOSIS — I10 Essential (primary) hypertension: Secondary | ICD-10-CM | POA: Diagnosis not present

## 2023-09-09 LAB — CBC WITH DIFFERENTIAL (CANCER CENTER ONLY)
Abs Immature Granulocytes: 0.03 K/uL (ref 0.00–0.07)
Basophils Absolute: 0 K/uL (ref 0.0–0.1)
Basophils Relative: 1 %
Eosinophils Absolute: 0 K/uL (ref 0.0–0.5)
Eosinophils Relative: 0 %
HCT: 38.1 % — ABNORMAL LOW (ref 39.0–52.0)
Hemoglobin: 12.4 g/dL — ABNORMAL LOW (ref 13.0–17.0)
Immature Granulocytes: 0 %
Lymphocytes Relative: 8 %
Lymphs Abs: 0.6 K/uL — ABNORMAL LOW (ref 0.7–4.0)
MCH: 29.4 pg (ref 26.0–34.0)
MCHC: 32.5 g/dL (ref 30.0–36.0)
MCV: 90.3 fL (ref 80.0–100.0)
Monocytes Absolute: 0.8 K/uL (ref 0.1–1.0)
Monocytes Relative: 11 %
Neutro Abs: 6 K/uL (ref 1.7–7.7)
Neutrophils Relative %: 80 %
Platelet Count: 96 K/uL — ABNORMAL LOW (ref 150–400)
RBC: 4.22 MIL/uL (ref 4.22–5.81)
RDW: 16.3 % — ABNORMAL HIGH (ref 11.5–15.5)
WBC Count: 7.4 K/uL (ref 4.0–10.5)
nRBC: 0 % (ref 0.0–0.2)

## 2023-09-09 LAB — CMP (CANCER CENTER ONLY)
ALT: 10 U/L (ref 0–44)
AST: 16 U/L (ref 15–41)
Albumin: 3.8 g/dL (ref 3.5–5.0)
Alkaline Phosphatase: 101 U/L (ref 38–126)
Anion gap: 12 (ref 5–15)
BUN: 11 mg/dL (ref 8–23)
CO2: 23 mmol/L (ref 22–32)
Calcium: 9.3 mg/dL (ref 8.9–10.3)
Chloride: 101 mmol/L (ref 98–111)
Creatinine: 0.78 mg/dL (ref 0.61–1.24)
GFR, Estimated: >60 mL/min (ref >60)
Glucose, Bld: 141 mg/dL — ABNORMAL HIGH (ref 70–99)
Potassium: 4.1 mmol/L (ref 3.5–5.1)
Sodium: 136 mmol/L (ref 135–145)
Total Bilirubin: 0.5 mg/dL (ref 0.0–1.2)
Total Protein: 7.3 g/dL (ref 6.5–8.1)

## 2023-09-09 MED ORDER — SODIUM CHLORIDE 0.9% FLUSH: 10.0000 mL | INTRAVENOUS | Status: DC | PRN

## 2023-09-09 MED ORDER — ATROPINE SULFATE 1 MG/ML IV SOLN
0.5000 mg | Freq: Once | INTRAVENOUS | Status: AC | PRN
Start: 2023-09-09 — End: 2023-09-09
  Administered 2023-09-09: 0.5 mg via INTRAVENOUS
  Filled 2023-09-09: qty 1

## 2023-09-09 MED ORDER — DEXAMETHASONE SODIUM PHOSPHATE 10 MG/ML IJ SOLN
10.0000 mg | Freq: Once | INTRAMUSCULAR | Status: AC
  Administered 2023-09-09: 10 mg via INTRAVENOUS
  Filled 2023-09-09: qty 1

## 2023-09-09 MED ORDER — OXALIPLATIN CHEMO INJECTION 100 MG/20ML
45.0000 mg/m2 | Freq: Once | INTRAVENOUS | Status: AC
  Administered 2023-09-09: 80 mg via INTRAVENOUS
  Filled 2023-09-09: qty 16

## 2023-09-09 MED ORDER — HEPARIN SOD (PORK) LOCK FLUSH 100 UNIT/ML IV SOLN: 500.0000 [IU] | Freq: Once | INTRAVENOUS | Status: DC | PRN

## 2023-09-09 MED ORDER — SODIUM CHLORIDE 0.9 % IV SOLN
2400.0000 mg/m2 | INTRAVENOUS | Status: DC
  Administered 2023-09-09: 4200 mg via INTRAVENOUS
  Filled 2023-09-09: qty 84

## 2023-09-09 MED ORDER — DEXTROSE 5 % IV SOLN
INTRAVENOUS | Status: DC
Start: 2023-09-09 — End: 2023-09-09

## 2023-09-09 MED ORDER — SODIUM CHLORIDE 0.9 % IV SOLN
150.0000 mg | Freq: Once | INTRAVENOUS | Status: AC
  Administered 2023-09-09: 150 mg via INTRAVENOUS
  Filled 2023-09-09: qty 150

## 2023-09-09 MED ORDER — LEUCOVORIN CALCIUM INJECTION 350 MG
400.0000 mg/m2 | Freq: Once | INTRAVENOUS | Status: AC
  Administered 2023-09-09: 704 mg via INTRAVENOUS
  Filled 2023-09-09: qty 35.2

## 2023-09-09 MED ORDER — PALONOSETRON HCL INJECTION 0.25 MG/5ML
0.2500 mg | Freq: Once | INTRAVENOUS | Status: AC
  Administered 2023-09-09: 0.25 mg via INTRAVENOUS
  Filled 2023-09-09: qty 5

## 2023-09-09 MED ORDER — SODIUM CHLORIDE 0.9 % IV SOLN
40.0000 mg/m2 | Freq: Once | INTRAVENOUS | Status: AC
  Administered 2023-09-09: 68.8 mg via INTRAVENOUS
  Filled 2023-09-09: qty 16

## 2023-09-09 NOTE — Progress Notes (Signed)
 [facility] OFFICE PROGRESS NOTE   Diagnosis: Pancreas cancer  INTERVAL HISTORY:   Ralph Davis returns as scheduled.  He completed another cycle of NALIRIFOX on 08/12/2023.  No nausea/vomiting, mouth sores, or diarrhea.  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  Numbness in the fingers has improved.  No foot numbness.  No pain.  He is exercising.  Objective:  Vital signs in last 24 hours:  Blood pressure 135/67, pulse 91, temperature 98.4 F (36.9 C), temperature source Temporal, resp. rate 18, height 5' 8 (1.727 m), weight 166 lb 9.6 oz (75.6 kg), SpO2 100%.    HEENT: No thrush or ulcers Lymphatics: No cervical or supraclavicular nodes Resp: Lungs clear bilaterally Cardio: Regular rate and rhythm GI: No mass, nontender, no hepatosplenomegaly Vascular: Leg edema Neuro: Moderate loss of vibratory sense at the fingertips bilaterally  Portacath/PICC-without erythema  Lab Results:  Lab Results  Component Value Date   WBC 7.4 09/09/2023   HGB 12.4 (L) 09/09/2023   HCT 38.1 (L) 09/09/2023   MCV 90.3 09/09/2023   PLT 96 (L) 09/09/2023   NEUTROABS 6.0 09/09/2023    CMP  Lab Results  Component Value Date   NA 136 09/09/2023   K 4.1 09/09/2023   CL 101 09/09/2023   CO2 23 09/09/2023   GLUCOSE 141 (H) 09/09/2023   BUN 11 09/09/2023   CREATININE 0.78 09/09/2023   CALCIUM  9.3 09/09/2023   PROT 7.3 09/09/2023   ALBUMIN  3.8 09/09/2023   AST 16 09/09/2023   ALT 10 09/09/2023   ALKPHOS 101 09/09/2023   BILITOT 0.5 09/09/2023   GFRNONAA >60 09/09/2023   GFRAA >60 05/08/2019    Lab Results  Component Value Date   CEA 75.60 (H) 02/06/2023   CAN199 101 (H) 08/12/2023    Lab Results  Component Value Date   INR 1.1 02/07/2023   LABPROT 14.7 02/07/2023    Imaging:  No results found.  Medications: I have reviewed the patient's current medications.   Assessment/Plan: Pancreas cancer   bloating/constipation/anorexia and weight loss/back pain EGD 01/20/2023-diffuse moderate mucosal changes characterized by congestion and erythema in the gastric antrum; a large ulcerated and fungating mass with stigmata of recent bleeding in the duodenal bulb; segmental moderate inflammation characterized by congestion and erythema found at the pylorus.  There was no evidence of dysplasia or malignancy in the duodenum biopsies.   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 02/06/2023 CEA and CA 19-9 elevated 02/07/2023 biopsy left abdominal soft tissue implant-metastatic moderately differentiated adenocarcinoma, MSS, tumor mutation burden 4, HRD signature negative, [id number] 02/10/2023 CTs neck and chest-no metastatic disease.  Large cystic appearing pancreatic mass and adjacent cystic/necrotic peritoneal lesions. Cycle 1 NALIRIFOX 02/18/2023 Cycle 2 NALIRIFOX 03/06/2023 Cycle 3 NALIRIFOX 03/18/2023 Cycle 4 NALIRIFOX 04/01/2023 Cycle 5 NALIRIFOX 04/14/2023, doses adjusted for weight loss CT abdomen/pelvis 04/28/2023: Mild decrease in size pancreas body/tail mass and peritoneal implants.  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  He continues to tolerate the chemotherapy well.  His clinical status has improved since beginning chemotherapy.  I reviewed the restaging CT findings and images with him.  The CTs are consistent with a continued response to therapy.  I recommend continuing NALIRIFOX.  He will continue treatment every 4 weeks. He has mild thrombocytopenia.  He will call for bleeding or bruising.  Ralph Davis return for an office visit and chemotherapy in 4 weeks.  Arley Hof, MD  09/09/2023  9:34 AM

## 2023-09-09 NOTE — Progress Notes (Signed)
 Patient seen by Dr. Arley Hof today  Vitals are within treatment parameters:Yes   Labs are within treatment parameters: No (Please specify and give further instructions.)  OK to proceed w/platelet count 96,000  Treatment plan has been signed: Yes   Per physician team, Patient is ready for treatment and there are NO modifications to the treatment plan.

## 2023-09-09 NOTE — Patient Instructions (Addendum)
 [facility] - A DEPT OF [place]  Discharge Instructions: Thank you for choosing [facility] to provide your oncology and hematology care.   If you have a lab appointment with the Cancer Center, please go directly to the Cancer Center and check in at the registration area.   Wear comfortable clothing and clothing appropriate for easy access to any Portacath or PICC line.   We strive to give you quality time with your provider. You may need to reschedule your appointment if you arrive late (15 or more minutes).  Arriving late affects you and other patients whose appointments are after yours.  Also, if you miss three or more appointments without notifying the office, you may be dismissed from the clinic at the provider's discretion.      For prescription refill requests, have your pharmacy contact our office and allow 72 hours for refills to be completed.    Today you received the following chemotherapy and/or immunotherapy agents: Irinetecan Liposomal, Oxaliplatin , Leucovorin , and Fluorouracil .      To help prevent nausea and vomiting after your treatment, we encourage you to take your nausea medication as directed.  BELOW ARE SYMPTOMS THAT SHOULD BE REPORTED IMMEDIATELY: *FEVER GREATER THAN 100.4 F (38 C) OR HIGHER *CHILLS OR SWEATING *NAUSEA AND VOMITING THAT IS NOT CONTROLLED WITH YOUR NAUSEA MEDICATION *UNUSUAL SHORTNESS OF BREATH *UNUSUAL BRUISING OR BLEEDING *URINARY PROBLEMS (pain or burning when urinating, or frequent urination) *BOWEL PROBLEMS (unusual diarrhea, constipation, pain near the anus) TENDERNESS IN MOUTH AND THROAT WITH OR WITHOUT PRESENCE OF ULCERS (sore throat, sores in mouth, or a toothache) UNUSUAL RASH, SWELLING OR PAIN  UNUSUAL VAGINAL DISCHARGE OR ITCHING   Items with * indicate a potential emergency and should be followed up as soon as possible or go to the Emergency Department if any problems should  occur.  Please show the CHEMOTHERAPY ALERT CARD or IMMUNOTHERAPY ALERT CARD at check-in to the Emergency Department and triage nurse.  Should you have questions after your visit or need to cancel or reschedule your appointment, please contact [facility]  Dept: [phone]  and follow the prompts.  Office hours are 8:00 a.m. to 4:30 p.m. Monday - Friday. Please note that voicemails left after 4:00 p.m. may not be returned until the following business day.  We are closed weekends and major holidays. You have access to a nurse at all times for urgent questions. Please call the main number to the clinic Dept: [phone] and follow the prompts.   For any non-urgent questions, you may also contact your provider using MyChart. We now offer e-Visits for anyone 61 and older to request care online for non-urgent symptoms. For details visit [email or web address].   Also download the MyChart app! Go to the app store, search MyChart, open the app, select , and log in with your MyChart username and password.  The chemotherapy medication bag should finish at 46 hours, 96 hours, or 7 days. For example, if your pump is scheduled for 46 hours and it was put on at 4:00 p.m., it should finish at 2:00 p.m. the day it is scheduled to come off regardless of your appointment time.     Estimated time to finish at: 2:30pm on Thursday 7/24   If the display on your pump reads Low Volume and it is beeping, take the batteries out of the pump and come to the cancer  center for it to be taken off.   If the pump alarms go off prior to the pump reading Low Volume then call [phone] and someone can assist you.  If the plunger comes out and the chemotherapy medication is leaking out, please use your home chemo spill kit to clean up the spill. Do NOT use paper towels or other household products.  If you have problems or questions regarding your  pump, please call either [phone] (24 hours a day) or the cancer center Monday-Friday 8:00 a.m.- 4:30 p.m. at the clinic number and we will assist you. If you are unable to get assistance, then go to the nearest Emergency Department and ask the staff to contact the IV team for assistance.

## 2023-09-10 ENCOUNTER — Other Ambulatory Visit: Payer: Self-pay

## 2023-09-11 ENCOUNTER — Inpatient Hospital Stay

## 2023-09-11 VITALS — BP 144/78 | HR 68 | Temp 98.1°F | Resp 18

## 2023-09-11 DIAGNOSIS — C252 Malignant neoplasm of tail of pancreas: Secondary | ICD-10-CM

## 2023-09-11 DIAGNOSIS — G620 Drug-induced polyneuropathy: Secondary | ICD-10-CM | POA: Diagnosis not present

## 2023-09-11 DIAGNOSIS — I10 Essential (primary) hypertension: Secondary | ICD-10-CM | POA: Diagnosis not present

## 2023-09-11 DIAGNOSIS — Z5111 Encounter for antineoplastic chemotherapy: Secondary | ICD-10-CM | POA: Diagnosis not present

## 2023-09-11 DIAGNOSIS — C258 Malignant neoplasm of overlapping sites of pancreas: Secondary | ICD-10-CM | POA: Diagnosis not present

## 2023-09-11 DIAGNOSIS — D696 Thrombocytopenia, unspecified: Secondary | ICD-10-CM | POA: Diagnosis not present

## 2023-09-11 DIAGNOSIS — Z452 Encounter for adjustment and management of vascular access device: Secondary | ICD-10-CM | POA: Diagnosis not present

## 2023-09-11 MED ORDER — SODIUM CHLORIDE 0.9% FLUSH
10.0000 mL | INTRAVENOUS | Status: DC | PRN
  Administered 2023-09-11: 10 mL

## 2023-09-11 MED ORDER — HEPARIN SOD (PORK) LOCK FLUSH 100 UNIT/ML IV SOLN
500.0000 [IU] | Freq: Once | INTRAVENOUS | Status: AC | PRN
Start: 2023-09-11 — End: 2023-09-11
  Administered 2023-09-11: 500 [IU]

## 2023-10-01 DIAGNOSIS — G4733 Obstructive sleep apnea (adult) (pediatric): Secondary | ICD-10-CM | POA: Diagnosis not present

## 2023-10-05 ENCOUNTER — Other Ambulatory Visit: Payer: Self-pay | Admitting: Oncology

## 2023-10-07 ENCOUNTER — Inpatient Hospital Stay: Attending: Nurse Practitioner

## 2023-10-07 ENCOUNTER — Encounter: Payer: Self-pay | Admitting: Nurse Practitioner

## 2023-10-07 ENCOUNTER — Inpatient Hospital Stay

## 2023-10-07 ENCOUNTER — Inpatient Hospital Stay (HOSPITAL_BASED_OUTPATIENT_CLINIC_OR_DEPARTMENT_OTHER): Admitting: Nurse Practitioner

## 2023-10-07 VITALS — BP 136/83 | HR 80 | Temp 97.8°F | Resp 18 | Ht 68.0 in | Wt 167.0 lb

## 2023-10-07 DIAGNOSIS — I10 Essential (primary) hypertension: Secondary | ICD-10-CM | POA: Insufficient documentation

## 2023-10-07 DIAGNOSIS — C258 Malignant neoplasm of overlapping sites of pancreas: Secondary | ICD-10-CM | POA: Diagnosis not present

## 2023-10-07 DIAGNOSIS — Z5111 Encounter for antineoplastic chemotherapy: Secondary | ICD-10-CM | POA: Diagnosis not present

## 2023-10-07 DIAGNOSIS — Z452 Encounter for adjustment and management of vascular access device: Secondary | ICD-10-CM | POA: Diagnosis not present

## 2023-10-07 DIAGNOSIS — G620 Drug-induced polyneuropathy: Secondary | ICD-10-CM | POA: Diagnosis not present

## 2023-10-07 DIAGNOSIS — C252 Malignant neoplasm of tail of pancreas: Secondary | ICD-10-CM

## 2023-10-07 LAB — CBC WITH DIFFERENTIAL (CANCER CENTER ONLY)
Abs Immature Granulocytes: 0.02 K/uL (ref 0.00–0.07)
Basophils Absolute: 0.1 K/uL (ref 0.0–0.1)
Basophils Relative: 1 %
Eosinophils Absolute: 0.1 K/uL (ref 0.0–0.5)
Eosinophils Relative: 1 %
HCT: 37 % — ABNORMAL LOW (ref 39.0–52.0)
Hemoglobin: 11.7 g/dL — ABNORMAL LOW (ref 13.0–17.0)
Immature Granulocytes: 0 %
Lymphocytes Relative: 17 %
Lymphs Abs: 1.3 K/uL (ref 0.7–4.0)
MCH: 28.3 pg (ref 26.0–34.0)
MCHC: 31.6 g/dL (ref 30.0–36.0)
MCV: 89.4 fL (ref 80.0–100.0)
Monocytes Absolute: 0.7 K/uL (ref 0.1–1.0)
Monocytes Relative: 10 %
Neutro Abs: 5.1 K/uL (ref 1.7–7.7)
Neutrophils Relative %: 71 %
Platelet Count: 129 K/uL — ABNORMAL LOW (ref 150–400)
RBC: 4.14 MIL/uL — ABNORMAL LOW (ref 4.22–5.81)
RDW: 16.6 % — ABNORMAL HIGH (ref 11.5–15.5)
WBC Count: 7.2 K/uL (ref 4.0–10.5)
nRBC: 0 % (ref 0.0–0.2)

## 2023-10-07 LAB — CMP (CANCER CENTER ONLY)
ALT: 11 U/L (ref 0–44)
AST: 15 U/L (ref 15–41)
Albumin: 3.9 g/dL (ref 3.5–5.0)
Alkaline Phosphatase: 104 U/L (ref 38–126)
Anion gap: 12 (ref 5–15)
BUN: 12 mg/dL (ref 8–23)
CO2: 23 mmol/L (ref 22–32)
Calcium: 9.6 mg/dL (ref 8.9–10.3)
Chloride: 105 mmol/L (ref 98–111)
Creatinine: 0.72 mg/dL (ref 0.61–1.24)
GFR, Estimated: >60 mL/min (ref >60)
Glucose, Bld: 134 mg/dL — ABNORMAL HIGH (ref 70–99)
Potassium: 4.1 mmol/L (ref 3.5–5.1)
Sodium: 141 mmol/L (ref 135–145)
Total Bilirubin: 0.4 mg/dL (ref 0.0–1.2)
Total Protein: 7.1 g/dL (ref 6.5–8.1)

## 2023-10-07 MED ORDER — DEXTROSE 5 % IV SOLN: INTRAVENOUS | Status: DC

## 2023-10-07 MED ORDER — DEXAMETHASONE SODIUM PHOSPHATE 10 MG/ML IJ SOLN
10.0000 mg | Freq: Once | INTRAMUSCULAR | Status: AC
  Administered 2023-10-07: 10 mg via INTRAVENOUS
  Filled 2023-10-07: qty 1

## 2023-10-07 MED ORDER — ATROPINE SULFATE 1 MG/ML IV SOLN
0.5000 mg | Freq: Once | INTRAVENOUS | Status: AC | PRN
  Administered 2023-10-07: 0.5 mg via INTRAVENOUS
  Filled 2023-10-07: qty 1

## 2023-10-07 MED ORDER — SODIUM CHLORIDE 0.9 % IV SOLN
150.0000 mg | Freq: Once | INTRAVENOUS | Status: AC
  Administered 2023-10-07: 150 mg via INTRAVENOUS
  Filled 2023-10-07: qty 150

## 2023-10-07 MED ORDER — LEUCOVORIN CALCIUM INJECTION 350 MG
400.0000 mg/m2 | Freq: Once | INTRAVENOUS | Status: AC
  Administered 2023-10-07: 704 mg via INTRAVENOUS
  Filled 2023-10-07: qty 35.2

## 2023-10-07 MED ORDER — PALONOSETRON HCL INJECTION 0.25 MG/5ML
0.2500 mg | Freq: Once | INTRAVENOUS | Status: AC
  Administered 2023-10-07: 0.25 mg via INTRAVENOUS
  Filled 2023-10-07: qty 5

## 2023-10-07 MED ORDER — SODIUM CHLORIDE 0.9 % IV SOLN: INTRAVENOUS | Status: DC

## 2023-10-07 MED ORDER — SODIUM CHLORIDE 0.9 % IV SOLN
40.0000 mg/m2 | Freq: Once | INTRAVENOUS | Status: AC
  Administered 2023-10-07: 68.8 mg via INTRAVENOUS
  Filled 2023-10-07: qty 16

## 2023-10-07 MED ORDER — SODIUM CHLORIDE 0.9 % IV SOLN
2400.0000 mg/m2 | INTRAVENOUS | Status: DC
  Administered 2023-10-07: 4200 mg via INTRAVENOUS
  Filled 2023-10-07: qty 84

## 2023-10-07 MED ORDER — OXALIPLATIN CHEMO INJECTION 100 MG/20ML
45.0000 mg/m2 | Freq: Once | INTRAVENOUS | Status: AC
  Administered 2023-10-07: 80 mg via INTRAVENOUS
  Filled 2023-10-07: qty 16

## 2023-10-07 NOTE — Progress Notes (Signed)
 [facility] OFFICE PROGRESS NOTE   Diagnosis: Pancreas cancer  INTERVAL HISTORY:   Mr. Merrow returns as scheduled.  He completed another cycle of NALIRIFOX 09/09/2023.  He denies nausea/vomiting.  No mouth sores.  No diarrhea.  He did not experience cold sensitivity.  No change in baseline neuropathy symptoms which predated the start of chemotherapy.  No abdominal pain.  He has a good appetite.  Energy level varies.  No bleeding.  Objective:  Vital signs in last 24 hours:  Blood pressure 136/83, pulse 80, temperature 97.8 F (36.6 C), temperature source Temporal, resp. rate 18, height 5' 8 (1.727 m), weight 167 lb (75.8 kg), SpO2 100%.    HEENT: No thrush or ulcers. Resp: Lungs clear bilaterally. Cardio: Regular rate and rhythm. GI: No hepatosplenomegaly.  No mass.  Nontender. Vascular: No leg edema. Skin: Palms without erythema. Port-A-Cath without erythema.  Lab Results:  Lab Results  Component Value Date   WBC 7.2 10/07/2023   HGB 11.7 (L) 10/07/2023   HCT 37.0 (L) 10/07/2023   MCV 89.4 10/07/2023   PLT 129 (L) 10/07/2023   NEUTROABS 5.1 10/07/2023    Imaging:  No results found.  Medications: I have reviewed the patient's current medications.  Assessment/Plan: Pancreas cancer  bloating/constipation/anorexia and weight loss/back pain EGD 01/20/2023-diffuse moderate mucosal changes characterized by congestion and erythema in the gastric antrum; a large ulcerated and fungating mass with stigmata of recent bleeding in the duodenal bulb; segmental moderate inflammation characterized by congestion and erythema found at the pylorus.  There was no evidence of dysplasia or malignancy in the duodenum biopsies.   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 02/06/2023 CEA and CA 19-9 elevated 02/07/2023 biopsy left abdominal soft tissue implant-metastatic moderately differentiated adenocarcinoma, MSS, tumor mutation burden 4, HRD signature negative, [id number] 02/10/2023 CTs neck and chest-no metastatic disease.  Large cystic appearing pancreatic mass and adjacent cystic/necrotic peritoneal lesions. Cycle 1 NALIRIFOX 02/18/2023 Cycle 2 NALIRIFOX 03/06/2023 Cycle 3 NALIRIFOX 03/18/2023 Cycle 4 NALIRIFOX 04/01/2023 Cycle 5 NALIRIFOX 04/14/2023, doses adjusted for weight loss CT abdomen/pelvis 04/28/2023: Mild decrease in size pancreas body/tail mass and peritoneal implants.  New small volume splenic infarct, fistulous communication between the pancreas body mass and gastric body Cycle 6 NALIRIFOX 05/13/2023, irinotecan  and oxaliplatin  dose reduced Cycle 7 NALIRIFOX 05/28/2023 Cycle 8 NALIRIFOX 06/16/2023 Cycle 9 NALIRIFOX 07/15/2023 Cycle 10 NALIRIFOX 08/12/2023 CT abdomen/pelvis 09/02/2023: Mild decrease in the pancreas mass and left peritoneal implant, no evidence of disease progression Cycle 11 NALIRIFOX 09/09/2023 Cycle 12 NALIRIFOX 10/07/2023 Hypertension Hypersalivation following cycle 3 NALIRIFOX-likely related to irinotecan , improved with scopolamine  patch 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  He has completed 11 cycles of NALIRIFOX.  He continues to tolerate treatment well.  He has a good performance status.  No clinical evidence of disease progression.  Plan to proceed with cycle 12 today as scheduled.  CBC and chemistry panel reviewed.  Labs adequate for treatment.  He will  return for follow-up and treatment in 4  weeks.  We are available to see him sooner if needed.    Olam Ned ANP/GNP-BC   10/07/2023  9:29 AM

## 2023-10-07 NOTE — Patient Instructions (Signed)

## 2023-10-07 NOTE — Progress Notes (Signed)
 Patient seen by Olam Ned NP today  Vitals are within treatment parameters:Yes   Labs are within treatment parameters: Yes   Treatment plan has been signed: Yes   Per physician team, Patient is ready for treatment and there are NO modifications to the treatment plan.

## 2023-10-07 NOTE — Patient Instructions (Addendum)
 [facility] - A DEPT OF [place]  Discharge Instructions: Thank you for choosing [facility] to provide your oncology and hematology care.   If you have a lab appointment with the Cancer Center, please go directly to the Cancer Center and check in at the registration area.   Wear comfortable clothing and clothing appropriate for easy access to any Portacath or PICC line.   We strive to give you quality time with your provider. You may need to reschedule your appointment if you arrive late (15 or more minutes).  Arriving late affects you and other patients whose appointments are after yours.  Also, if you miss three or more appointments without notifying the office, you may be dismissed from the clinic at the provider's discretion.      For prescription refill requests, have your pharmacy contact our office and allow 72 hours for refills to be completed.    Today you received the following chemotherapy and/or immunotherapy agents: Irinetecan Liposomal, Oxaliplatin , Leucovorin , and Fluorouracil .      To help prevent nausea and vomiting after your treatment, we encourage you to take your nausea medication as directed.  BELOW ARE SYMPTOMS THAT SHOULD BE REPORTED IMMEDIATELY: *FEVER GREATER THAN 100.4 F (38 C) OR HIGHER *CHILLS OR SWEATING *NAUSEA AND VOMITING THAT IS NOT CONTROLLED WITH YOUR NAUSEA MEDICATION *UNUSUAL SHORTNESS OF BREATH *UNUSUAL BRUISING OR BLEEDING *URINARY PROBLEMS (pain or burning when urinating, or frequent urination) *BOWEL PROBLEMS (unusual diarrhea, constipation, pain near the anus) TENDERNESS IN MOUTH AND THROAT WITH OR WITHOUT PRESENCE OF ULCERS (sore throat, sores in mouth, or a toothache) UNUSUAL RASH, SWELLING OR PAIN  UNUSUAL VAGINAL DISCHARGE OR ITCHING   Items with * indicate a potential emergency and should be followed up as soon as possible or go to the Emergency Department if any problems should  occur.  Please show the CHEMOTHERAPY ALERT CARD or IMMUNOTHERAPY ALERT CARD at check-in to the Emergency Department and triage nurse.  Should you have questions after your visit or need to cancel or reschedule your appointment, please contact [facility] - [facility]  Dept: [phone]  and follow the prompts.  Office hours are 8:00 a.m. to 4:30 p.m. Monday - Friday. Please note that voicemails left after 4:00 p.m. may not be returned until the following business day.  We are closed weekends and major holidays. You have access to a nurse at all times for urgent questions. Please call the main number to the clinic Dept: [phone] and follow the prompts.   For any non-urgent questions, you may also contact your provider using MyChart. We now offer e-Visits for anyone 25 and older to request care online for non-urgent symptoms. For details visit [email or web address].   Also download the MyChart app! Go to the app store, search MyChart, open the app, select [place], and log in with your MyChart username and password.  The chemotherapy medication bag should finish at 46 hours, 96 hours, or 7 days. For example, if your pump is scheduled for 46 hours and it was put on at 4:00 p.m., it should finish at 2:00 p.m. the day it is scheduled to come off regardless of your appointment time.     Estimated time to finish: 2:30pm 10/09/23   If the display on your pump reads Low Volume and it is beeping, take the batteries out of the pump and come to the cancer center for it  to be taken off.   If the pump alarms go off prior to the pump reading Low Volume then call [phone] and someone can assist you.  If the plunger comes out and the chemotherapy medication is leaking out, please use your home chemo spill kit to clean up the spill. Do NOT use paper towels or other household products.  If you have problems or questions regarding your pump, please  call either [phone] (24 hours a day) or the cancer center Monday-Friday 8:00 a.m.- 4:30 p.m. at the clinic number and we will assist you. If you are unable to get assistance, then go to the nearest Emergency Department and ask the staff to contact the IV team for assistance.

## 2023-10-08 LAB — CANCER ANTIGEN 19-9: CA 19-9: 253 U/mL — ABNORMAL HIGH (ref 0–35)

## 2023-10-09 ENCOUNTER — Inpatient Hospital Stay

## 2023-10-09 VITALS — BP 141/81 | HR 81 | Temp 98.6°F | Resp 18

## 2023-10-09 DIAGNOSIS — C252 Malignant neoplasm of tail of pancreas: Secondary | ICD-10-CM

## 2023-10-09 MED ORDER — SODIUM CHLORIDE 0.9% FLUSH
10.0000 mL | INTRAVENOUS | Status: DC | PRN
  Administered 2023-10-09: 10 mL

## 2023-10-25 ENCOUNTER — Other Ambulatory Visit: Payer: Self-pay

## 2023-10-29 ENCOUNTER — Other Ambulatory Visit: Payer: Self-pay

## 2023-11-02 ENCOUNTER — Other Ambulatory Visit: Payer: Self-pay | Admitting: Oncology

## 2023-11-03 MED ORDER — SODIUM CHLORIDE 0.9 % IV SOLN
2400.0000 mg/m2 | INTRAVENOUS | Status: DC
  Administered 2023-11-04: 4200 mg via INTRAVENOUS
  Filled 2023-11-03: qty 84

## 2023-11-04 ENCOUNTER — Inpatient Hospital Stay

## 2023-11-04 ENCOUNTER — Inpatient Hospital Stay: Attending: Nurse Practitioner

## 2023-11-04 ENCOUNTER — Inpatient Hospital Stay (HOSPITAL_BASED_OUTPATIENT_CLINIC_OR_DEPARTMENT_OTHER): Admitting: Oncology

## 2023-11-04 VITALS — BP 129/81 | HR 78 | Temp 98.2°F | Resp 18

## 2023-11-04 VITALS — BP 135/83 | HR 86 | Temp 97.9°F | Resp 18 | Wt 162.7 lb

## 2023-11-04 DIAGNOSIS — C252 Malignant neoplasm of tail of pancreas: Secondary | ICD-10-CM | POA: Diagnosis not present

## 2023-11-04 DIAGNOSIS — Z5111 Encounter for antineoplastic chemotherapy: Secondary | ICD-10-CM | POA: Insufficient documentation

## 2023-11-04 DIAGNOSIS — Z23 Encounter for immunization: Secondary | ICD-10-CM | POA: Diagnosis not present

## 2023-11-04 DIAGNOSIS — I10 Essential (primary) hypertension: Secondary | ICD-10-CM | POA: Insufficient documentation

## 2023-11-04 DIAGNOSIS — C258 Malignant neoplasm of overlapping sites of pancreas: Secondary | ICD-10-CM | POA: Diagnosis not present

## 2023-11-04 DIAGNOSIS — G620 Drug-induced polyneuropathy: Secondary | ICD-10-CM | POA: Diagnosis not present

## 2023-11-04 LAB — CMP (CANCER CENTER ONLY)
ALT: 11 U/L (ref 0–44)
AST: 16 U/L (ref 15–41)
Albumin: 3.9 g/dL (ref 3.5–5.0)
Alkaline Phosphatase: 101 U/L (ref 38–126)
Anion gap: 13 (ref 5–15)
BUN: 11 mg/dL (ref 8–23)
CO2: 23 mmol/L (ref 22–32)
Calcium: 9.6 mg/dL (ref 8.9–10.3)
Chloride: 103 mmol/L (ref 98–111)
Creatinine: 0.59 mg/dL — ABNORMAL LOW (ref 0.61–1.24)
GFR, Estimated: >60 mL/min (ref >60)
Glucose, Bld: 134 mg/dL — ABNORMAL HIGH (ref 70–99)
Potassium: 4 mmol/L (ref 3.5–5.1)
Sodium: 139 mmol/L (ref 135–145)
Total Bilirubin: 0.4 mg/dL (ref 0.0–1.2)
Total Protein: 7.3 g/dL (ref 6.5–8.1)

## 2023-11-04 LAB — CBC WITH DIFFERENTIAL (CANCER CENTER ONLY)
Abs Immature Granulocytes: 0.03 K/uL (ref 0.00–0.07)
Basophils Absolute: 0.1 K/uL (ref 0.0–0.1)
Basophils Relative: 1 %
Eosinophils Absolute: 0.1 K/uL (ref 0.0–0.5)
Eosinophils Relative: 1 %
HCT: 38 % — ABNORMAL LOW (ref 39.0–52.0)
Hemoglobin: 12 g/dL — ABNORMAL LOW (ref 13.0–17.0)
Immature Granulocytes: 0 %
Lymphocytes Relative: 15 %
Lymphs Abs: 1.3 K/uL (ref 0.7–4.0)
MCH: 27.8 pg (ref 26.0–34.0)
MCHC: 31.6 g/dL (ref 30.0–36.0)
MCV: 88.2 fL (ref 80.0–100.0)
Monocytes Absolute: 0.7 K/uL (ref 0.1–1.0)
Monocytes Relative: 8 %
Neutro Abs: 6.5 K/uL (ref 1.7–7.7)
Neutrophils Relative %: 75 %
Platelet Count: 141 K/uL — ABNORMAL LOW (ref 150–400)
RBC: 4.31 MIL/uL (ref 4.22–5.81)
RDW: 16.5 % — ABNORMAL HIGH (ref 11.5–15.5)
WBC Count: 8.7 K/uL (ref 4.0–10.5)
nRBC: 0 % (ref 0.0–0.2)

## 2023-11-04 MED ORDER — PALONOSETRON HCL INJECTION 0.25 MG/5ML
0.2500 mg | Freq: Once | INTRAVENOUS | Status: AC
  Administered 2023-11-04: 0.25 mg via INTRAVENOUS
  Filled 2023-11-04: qty 5

## 2023-11-04 MED ORDER — OXALIPLATIN CHEMO INJECTION 100 MG/20ML
45.0000 mg/m2 | Freq: Once | INTRAVENOUS | Status: AC
  Administered 2023-11-04: 80 mg via INTRAVENOUS
  Filled 2023-11-04: qty 16

## 2023-11-04 MED ORDER — SODIUM CHLORIDE 0.9 % IV SOLN: INTRAVENOUS | Status: DC

## 2023-11-04 MED ORDER — DEXTROSE 5 % IV SOLN: INTRAVENOUS | Status: DC

## 2023-11-04 MED ORDER — ATROPINE SULFATE 1 MG/ML IV SOLN
0.5000 mg | Freq: Once | INTRAVENOUS | Status: AC | PRN
  Administered 2023-11-04: 0.5 mg via INTRAVENOUS
  Filled 2023-11-04: qty 1

## 2023-11-04 MED ORDER — LEUCOVORIN CALCIUM INJECTION 350 MG
400.0000 mg/m2 | Freq: Once | INTRAVENOUS | Status: AC
  Administered 2023-11-04: 704 mg via INTRAVENOUS
  Filled 2023-11-04: qty 35.2

## 2023-11-04 MED ORDER — INFLUENZA VAC SPLIT HIGH-DOSE 0.5 ML IM SUSY
0.5000 mL | PREFILLED_SYRINGE | Freq: Once | INTRAMUSCULAR | Status: AC
  Administered 2023-11-04: 0.5 mL via INTRAMUSCULAR
  Filled 2023-11-04: qty 0.5

## 2023-11-04 MED ORDER — SODIUM CHLORIDE 0.9 % IV SOLN
40.0000 mg/m2 | Freq: Once | INTRAVENOUS | Status: AC
  Administered 2023-11-04: 68.8 mg via INTRAVENOUS
  Filled 2023-11-04: qty 16

## 2023-11-04 MED ORDER — SODIUM CHLORIDE 0.9 % IV SOLN
150.0000 mg | Freq: Once | INTRAVENOUS | Status: AC
  Administered 2023-11-04: 150 mg via INTRAVENOUS
  Filled 2023-11-04: qty 150

## 2023-11-04 MED ORDER — DEXAMETHASONE SODIUM PHOSPHATE 10 MG/ML IJ SOLN
10.0000 mg | Freq: Once | INTRAMUSCULAR | Status: AC
  Administered 2023-11-04: 10 mg via INTRAVENOUS
  Filled 2023-11-04: qty 1

## 2023-11-04 NOTE — Progress Notes (Signed)
 Patient seen by Dr. Arley Hof today  Vitals are within treatment parameters:Yes   Labs are within treatment parameters: Yes   Treatment plan has been signed: Yes   Per physician team, Patient is ready for treatment and there are NO modifications to the treatment plan. Requesting flu vaccine-order in [facility]

## 2023-11-04 NOTE — Patient Instructions (Addendum)
 [facility] - A DEPT OF [place]  Discharge Instructions: Thank you for choosing [facility] to provide your oncology and hematology care.   If you have a lab appointment with the Cancer Center, please go directly to the Cancer Center and check in at the registration area.   Wear comfortable clothing and clothing appropriate for easy access to any Portacath or PICC line.   We strive to give you quality time with your provider. You may need to reschedule your appointment if you arrive late (15 or more minutes).  Arriving late affects you and other patients whose appointments are after yours.  Also, if you miss three or more appointments without notifying the office, you may be dismissed from the clinic at the provider's discretion.      For prescription refill requests, have your pharmacy contact our office and allow 72 hours for refills to be completed.    Today you received the following chemotherapy and/or immunotherapy agents: Irinetecan Liposomal, Oxaliplatin , Leucovorin , and Fluorouracil .      To help prevent nausea and vomiting after your treatment, we encourage you to take your nausea medication as directed.  BELOW ARE SYMPTOMS THAT SHOULD BE REPORTED IMMEDIATELY: *FEVER GREATER THAN 100.4 F (38 C) OR HIGHER *CHILLS OR SWEATING *NAUSEA AND VOMITING THAT IS NOT CONTROLLED WITH YOUR NAUSEA MEDICATION *UNUSUAL SHORTNESS OF BREATH *UNUSUAL BRUISING OR BLEEDING *URINARY PROBLEMS (pain or burning when urinating, or frequent urination) *BOWEL PROBLEMS (unusual diarrhea, constipation, pain near the anus) TENDERNESS IN MOUTH AND THROAT WITH OR WITHOUT PRESENCE OF ULCERS (sore throat, sores in mouth, or a toothache) UNUSUAL RASH, SWELLING OR PAIN  UNUSUAL VAGINAL DISCHARGE OR ITCHING   Items with * indicate a potential emergency and should be followed up as soon as possible or go to the Emergency Department if any problems should  occur.  Please show the CHEMOTHERAPY ALERT CARD or IMMUNOTHERAPY ALERT CARD at check-in to the Emergency Department and triage nurse.  Should you have questions after your visit or need to cancel or reschedule your appointment, please contact [facility]  Dept: [phone]  and follow the prompts.  Office hours are 8:00 a.m. to 4:30 p.m. Monday - Friday. Please note that voicemails left after 4:00 p.m. may not be returned until the following business day.  We are closed weekends and major holidays. You have access to a nurse at all times for urgent questions. Please call the main number to the clinic Dept: [phone] and follow the prompts.   For any non-urgent questions, you may also contact your provider using MyChart. We now offer e-Visits for anyone 81 and older to request care online for non-urgent symptoms. For details visit [email or web address].   Also download the MyChart app! Go to the app store, search MyChart, open the app, select [place], and log in with your MyChart username and password.  The chemotherapy medication bag should finish at 46 hours, 96 hours, or 7 days. For example, if your pump is scheduled for 46 hours and it was put on at 4:00 p.m., it should finish at 2:00 p.m. the day it is scheduled to come off regardless of your appointment time.     Estimated time to finish: 2:00pm 11/06/23   If the display on your pump reads Low Volume and it is beeping, take the batteries out of the pump and come to the cancer center for it  to be taken off.   If the pump alarms go off prior to the pump reading Low Volume then call [phone] and someone can assist you.  If the plunger comes out and the chemotherapy medication is leaking out, please use your home chemo spill kit to clean up the spill. Do NOT use paper towels or other household products.  If you have problems or questions regarding your pump, please  call either [phone] (24 hours a day) or the cancer center Monday-Friday 8:00 a.m.- 4:30 p.m. at the clinic number and we will assist you. If you are unable to get assistance, then go to the nearest Emergency Department and ask the staff to contact the IV team for assistance.

## 2023-11-04 NOTE — Progress Notes (Signed)
 [facility] OFFICE PROGRESS NOTE   Diagnosis: Pancreas cancer  INTERVAL HISTORY:   Ralph Davis completed another cycle of chemotherapy on 10/07/2023.  No nausea/vomiting, mouth sores, or diarrhea.  He reports stable numbness in the fingers.  He relates this to a remote injury.  The numbness does not interfere with activity.  He reports a good appetite.  He relates weight loss to attending a recent church retreat.  Objective:  Vital signs in last 24 hours:  Blood pressure 135/83, pulse 86, temperature 97.9 F (36.6 C), temperature source Temporal, resp. rate 18, weight 162 lb 11.2 oz (73.8 kg), SpO2 99%.    HEENT: No thrush or ulcers Resp: Lungs clear bilaterally Cardio: Regular rate and rhythm GI: No hepatosplenomegaly, nontender, no apparent ascites Vascular: No leg edema Neuro: Mild to moderate loss of vibratory sense at the fingertips bilaterally   Portacath/PICC-without erythema  Lab Results:  Lab Results  Component Value Date   WBC 8.7 11/04/2023   HGB 12.0 (L) 11/04/2023   HCT 38.0 (L) 11/04/2023   MCV 88.2 11/04/2023   PLT 141 (L) 11/04/2023   NEUTROABS 6.5 11/04/2023    CMP  Lab Results  Component Value Date   NA 141 10/07/2023   K 4.1 10/07/2023   CL 105 10/07/2023   CO2 23 10/07/2023   GLUCOSE 134 (H) 10/07/2023   BUN 12 10/07/2023   CREATININE 0.72 10/07/2023   CALCIUM  9.6 10/07/2023   PROT 7.1 10/07/2023   ALBUMIN  3.9 10/07/2023   AST 15 10/07/2023   ALT 11 10/07/2023   ALKPHOS 104 10/07/2023   BILITOT 0.4 10/07/2023   GFRNONAA >60 10/07/2023   GFRAA >60 05/08/2019    Lab Results  Component Value Date   CEA 75.60 (H) 02/06/2023   CAN199 253 (H) 10/07/2023     Medications: I have reviewed the patient's current medications.   Assessment/Plan: Pancreas cancer  bloating/constipation/anorexia and weight loss/back pain EGD 01/20/2023-diffuse moderate mucosal changes characterized by congestion and erythema in the gastric  antrum; a large ulcerated and fungating mass with stigmata of recent bleeding in the duodenal bulb; segmental moderate inflammation characterized by congestion and erythema found at the pylorus.  There was no evidence of dysplasia or malignancy in the duodenum biopsies.   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 02/06/2023 CEA and CA 19-9 elevated 02/07/2023 biopsy left abdominal soft tissue implant-metastatic moderately differentiated adenocarcinoma, MSS, tumor mutation burden 4, HRD signature negative, [id number] 02/10/2023 CTs neck and chest-no metastatic disease.  Large cystic appearing pancreatic mass and adjacent cystic/necrotic peritoneal lesions. Cycle 1 NALIRIFOX 02/18/2023 Cycle 2 NALIRIFOX 03/06/2023 Cycle 3 NALIRIFOX 03/18/2023 Cycle 4 NALIRIFOX 04/01/2023 Cycle 5 NALIRIFOX 04/14/2023, doses adjusted for weight loss CT abdomen/pelvis 04/28/2023: Mild decrease in size pancreas body/tail mass and peritoneal implants.  New small volume splenic infarct, fistulous communication between the pancreas body mass and gastric body Cycle 6 NALIRIFOX 05/13/2023, irinotecan  and oxaliplatin  dose reduced Cycle 7 NALIRIFOX 05/28/2023 Cycle 8 NALIRIFOX 06/16/2023 Cycle 9 NALIRIFOX 07/15/2023 Cycle 10 NALIRIFOX 08/12/2023 CT abdomen/pelvis 09/02/2023: Mild decrease in  the pancreas mass and left peritoneal implant, no evidence of disease progression Cycle 11 NALIRIFOX 09/09/2023 Cycle 12 NALIRIFOX 10/07/2023 Cycle 13  NALIRIFOX 11/04/2023 Hypertension Hypersalivation following cycle 3 NALIRIFOX-likely related to irinotecan , improved with scopolamine  patch 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  He is tolerating the chemotherapy well.  He will complete another cycle of NALIRIFOX today.  The CA 19-9 was higher when he was here last month.  Will follow-up on the CA 19-9 from today.  He will be referred for restaging CTs if the CA 19-9 is higher.  Ralph Davis will return for an office visit and chemotherapy in 4 weeks.  He received an influenza vaccine today.  Arley Hof, MD  11/04/2023  9:03 AM

## 2023-11-04 NOTE — Patient Instructions (Signed)

## 2023-11-05 LAB — CANCER ANTIGEN 19-9: CA 19-9: 306 U/mL — ABNORMAL HIGH (ref 0–35)

## 2023-11-06 ENCOUNTER — Inpatient Hospital Stay

## 2023-11-06 VITALS — BP 123/69 | HR 90 | Temp 99.2°F | Resp 18

## 2023-11-06 DIAGNOSIS — C252 Malignant neoplasm of tail of pancreas: Secondary | ICD-10-CM

## 2023-11-06 NOTE — Patient Instructions (Signed)

## 2023-11-10 ENCOUNTER — Ambulatory Visit: Payer: Self-pay | Admitting: Oncology

## 2023-11-11 ENCOUNTER — Encounter: Payer: Self-pay | Admitting: Oncology

## 2023-11-11 ENCOUNTER — Other Ambulatory Visit: Payer: Self-pay

## 2023-11-11 DIAGNOSIS — C252 Malignant neoplasm of tail of pancreas: Secondary | ICD-10-CM

## 2023-11-11 NOTE — Telephone Encounter (Signed)
 Mr. Kovacevic made aware of CA 19.9 results and need for CT scan. Scheduled port access on 10/03 at 3:30 pm and CT at [facility] same day at 4:30 report time. He agrees to appointments.

## 2023-11-21 ENCOUNTER — Ambulatory Visit (HOSPITAL_BASED_OUTPATIENT_CLINIC_OR_DEPARTMENT_OTHER)
Admission: RE | Admit: 2023-11-21 | Discharge: 2023-11-21 | Disposition: A | Discharge status: Home / Self Care | Source: Ambulatory Visit | Attending: Oncology | Admitting: Oncology

## 2023-11-21 ENCOUNTER — Inpatient Hospital Stay: Attending: Nurse Practitioner

## 2023-11-21 DIAGNOSIS — K8689 Other specified diseases of pancreas: Secondary | ICD-10-CM | POA: Diagnosis not present

## 2023-11-21 DIAGNOSIS — C252 Malignant neoplasm of tail of pancreas: Secondary | ICD-10-CM | POA: Insufficient documentation

## 2023-11-21 DIAGNOSIS — C258 Malignant neoplasm of overlapping sites of pancreas: Secondary | ICD-10-CM | POA: Insufficient documentation

## 2023-11-21 DIAGNOSIS — Z5111 Encounter for antineoplastic chemotherapy: Secondary | ICD-10-CM | POA: Insufficient documentation

## 2023-11-21 DIAGNOSIS — C259 Malignant neoplasm of pancreas, unspecified: Secondary | ICD-10-CM | POA: Diagnosis not present

## 2023-11-21 DIAGNOSIS — I10 Essential (primary) hypertension: Secondary | ICD-10-CM | POA: Insufficient documentation

## 2023-11-21 DIAGNOSIS — Z452 Encounter for adjustment and management of vascular access device: Secondary | ICD-10-CM | POA: Insufficient documentation

## 2023-11-21 MED ORDER — IOHEXOL 300 MG/ML  SOLN
100.0000 mL | Freq: Once | INTRAMUSCULAR | Status: AC | PRN
  Administered 2023-11-21: 100 mL via INTRAVENOUS

## 2023-11-21 NOTE — Patient Instructions (Signed)

## 2023-11-28 ENCOUNTER — Other Ambulatory Visit: Payer: Self-pay | Admitting: Pharmacist

## 2023-11-28 ENCOUNTER — Encounter: Payer: Self-pay | Admitting: Oncology

## 2023-12-02 ENCOUNTER — Inpatient Hospital Stay: Admitting: Nurse Practitioner

## 2023-12-02 ENCOUNTER — Encounter: Payer: Self-pay | Admitting: Nurse Practitioner

## 2023-12-02 ENCOUNTER — Inpatient Hospital Stay

## 2023-12-02 VITALS — BP 141/92 | HR 87 | Temp 98.3°F | Resp 18 | Ht 68.0 in | Wt 160.3 lb

## 2023-12-02 VITALS — BP 124/72 | HR 75 | Temp 97.9°F | Resp 18

## 2023-12-02 DIAGNOSIS — C252 Malignant neoplasm of tail of pancreas: Secondary | ICD-10-CM

## 2023-12-02 DIAGNOSIS — C258 Malignant neoplasm of overlapping sites of pancreas: Secondary | ICD-10-CM | POA: Diagnosis present

## 2023-12-02 DIAGNOSIS — E876 Hypokalemia: Secondary | ICD-10-CM | POA: Diagnosis not present

## 2023-12-02 DIAGNOSIS — I10 Essential (primary) hypertension: Secondary | ICD-10-CM | POA: Diagnosis not present

## 2023-12-02 DIAGNOSIS — Z5111 Encounter for antineoplastic chemotherapy: Secondary | ICD-10-CM | POA: Diagnosis present

## 2023-12-02 DIAGNOSIS — Z452 Encounter for adjustment and management of vascular access device: Secondary | ICD-10-CM | POA: Diagnosis not present

## 2023-12-02 LAB — CBC WITH DIFFERENTIAL (CANCER CENTER ONLY)
Abs Immature Granulocytes: 0.04 K/uL (ref 0.00–0.07)
Basophils Absolute: 0.1 K/uL (ref 0.0–0.1)
Basophils Relative: 1 %
Eosinophils Absolute: 0.1 K/uL (ref 0.0–0.5)
Eosinophils Relative: 1 %
HCT: 36.1 % — ABNORMAL LOW (ref 39.0–52.0)
Hemoglobin: 11.5 g/dL — ABNORMAL LOW (ref 13.0–17.0)
Immature Granulocytes: 0 %
Lymphocytes Relative: 13 %
Lymphs Abs: 1.2 K/uL (ref 0.7–4.0)
MCH: 27.8 pg (ref 26.0–34.0)
MCHC: 31.9 g/dL (ref 30.0–36.0)
MCV: 87.4 fL (ref 80.0–100.0)
Monocytes Absolute: 0.7 K/uL (ref 0.1–1.0)
Monocytes Relative: 7 %
Neutro Abs: 7 K/uL (ref 1.7–7.7)
Neutrophils Relative %: 78 %
Platelet Count: 122 K/uL — ABNORMAL LOW (ref 150–400)
RBC: 4.13 MIL/uL — ABNORMAL LOW (ref 4.22–5.81)
RDW: 15.7 % — ABNORMAL HIGH (ref 11.5–15.5)
WBC Count: 9 K/uL (ref 4.0–10.5)
nRBC: 0 % (ref 0.0–0.2)

## 2023-12-02 LAB — CMP (CANCER CENTER ONLY)
ALT: 14 U/L (ref 0–44)
AST: 20 U/L (ref 15–41)
Albumin: 3.7 g/dL (ref 3.5–5.0)
Alkaline Phosphatase: 97 U/L (ref 38–126)
Anion gap: 11 (ref 5–15)
BUN: 10 mg/dL (ref 8–23)
CO2: 24 mmol/L (ref 22–32)
Calcium: 9.4 mg/dL (ref 8.9–10.3)
Chloride: 103 mmol/L (ref 98–111)
Creatinine: 0.55 mg/dL — ABNORMAL LOW (ref 0.61–1.24)
GFR, Estimated: >60 mL/min (ref >60)
Glucose, Bld: 167 mg/dL — ABNORMAL HIGH (ref 70–99)
Potassium: 3.8 mmol/L (ref 3.5–5.1)
Sodium: 138 mmol/L (ref 135–145)
Total Bilirubin: 0.3 mg/dL (ref 0.0–1.2)
Total Protein: 7.1 g/dL (ref 6.5–8.1)

## 2023-12-02 MED ORDER — LEUCOVORIN CALCIUM INJECTION 350 MG
400.0000 mg/m2 | Freq: Once | INTRAVENOUS | Status: AC
  Administered 2023-12-02: 704 mg via INTRAVENOUS
  Filled 2023-12-02: qty 35.2

## 2023-12-02 MED ORDER — OXALIPLATIN CHEMO INJECTION 100 MG/20ML
45.0000 mg/m2 | Freq: Once | INTRAVENOUS | Status: AC
  Administered 2023-12-02: 80 mg via INTRAVENOUS
  Filled 2023-12-02: qty 16

## 2023-12-02 MED ORDER — SODIUM CHLORIDE 0.9 % IV SOLN: INTRAVENOUS | Status: DC

## 2023-12-02 MED ORDER — PALONOSETRON HCL INJECTION 0.25 MG/5ML
0.2500 mg | Freq: Once | INTRAVENOUS | Status: AC
  Administered 2023-12-02: 0.25 mg via INTRAVENOUS
  Filled 2023-12-02: qty 5

## 2023-12-02 MED ORDER — ATROPINE SULFATE 1 MG/ML IV SOLN
0.5000 mg | Freq: Once | INTRAVENOUS | Status: AC | PRN
  Administered 2023-12-02: 0.5 mg via INTRAVENOUS
  Filled 2023-12-02: qty 1

## 2023-12-02 MED ORDER — SODIUM CHLORIDE 0.9 % IV SOLN
150.0000 mg | Freq: Once | INTRAVENOUS | Status: AC
  Administered 2023-12-02: 150 mg via INTRAVENOUS
  Filled 2023-12-02: qty 150

## 2023-12-02 MED ORDER — DEXAMETHASONE SODIUM PHOSPHATE 10 MG/ML IJ SOLN
10.0000 mg | Freq: Once | INTRAMUSCULAR | Status: AC
  Administered 2023-12-02: 10 mg via INTRAVENOUS
  Filled 2023-12-02: qty 1

## 2023-12-02 MED ORDER — POTASSIUM CHLORIDE CRYS ER 10 MEQ PO TBCR: 10.0000 meq | EXTENDED_RELEASE_TABLET | Freq: Two times a day (BID) | ORAL | 2 refills | Status: AC

## 2023-12-02 MED ORDER — DEXTROSE 5 % IV SOLN: INTRAVENOUS | Status: DC

## 2023-12-02 MED ORDER — SODIUM CHLORIDE 0.9 % IV SOLN
2400.0000 mg/m2 | INTRAVENOUS | Status: DC
  Administered 2023-12-02: 4200 mg via INTRAVENOUS
  Filled 2023-12-02: qty 84

## 2023-12-02 MED ORDER — SODIUM CHLORIDE 0.9 % IV SOLN
40.0000 mg/m2 | Freq: Once | INTRAVENOUS | Status: AC
  Administered 2023-12-02: 68.8 mg via INTRAVENOUS
  Filled 2023-12-02: qty 16

## 2023-12-02 NOTE — Patient Instructions (Signed)

## 2023-12-02 NOTE — Progress Notes (Signed)
 [facility] OFFICE PROGRESS NOTE   Diagnosis: Pancreas cancer  INTERVAL HISTORY:   Mr. Poblete returns as scheduled.  He completed another cycle of NALIRIFOX 11/04/2023.  He denies nausea/vomiting.  No mouth sores.  No diarrhea.  He has noted increased mild constipation over the past week or so.  He denies neuropathy symptoms.  Recent mild back pain.  His wife notes he is more fatigued and continues to lose weight.  He attributes the fatigue and weight loss to activities.  Objective:  Vital signs in last 24 hours:  Blood pressure (!) 141/92, pulse 87, temperature 98.3 F (36.8 C), temperature source Temporal, resp. rate 18, height 5' 8 (1.727 m), weight 160 lb 4.8 oz (72.7 kg), SpO2 100%.    HEENT: No thrush or ulcers. Resp: Lungs clear bilaterally. Cardio: Regular rate and rhythm. GI: No hepatosplenomegaly.  Nontender. Vascular: No leg edema. Neuro: Mild to moderate decrease in vibratory sense over the fingertips bilaterally. Skin: Palms without erythema. Port-A-Cath without erythema.  Lab Results:  Lab Results  Component Value Date   WBC 9.0 12/02/2023   HGB 11.5 (L) 12/02/2023   HCT 36.1 (L) 12/02/2023   MCV 87.4 12/02/2023   PLT 122 (L) 12/02/2023   NEUTROABS 7.0 12/02/2023    Imaging:  No results found.  Medications: I have reviewed the patient's current medications.  Assessment/Plan: Pancreas cancer  bloating/constipation/anorexia and weight loss/back pain EGD 01/20/2023-diffuse moderate mucosal changes characterized by congestion and erythema in the gastric antrum; a large ulcerated and fungating mass with stigmata of recent bleeding in the duodenal bulb; segmental moderate inflammation characterized by congestion and erythema found at the pylorus.  There was no evidence of dysplasia or malignancy in the duodenum biopsies.   CT abdomen/pelvis 01/28/2023-necrotic mass with peripheral enhancement involving the pancreatic body and tail measuring  10.1 x 6.3 cm; mass encasing the splenic artery and causes splenic vein thrombosis; no evidence of vascular involvement of the celiac axis, superior mesenteric artery or veins or portal vein; necrotic peripancreatic lymph node seen along the anterior aspect of the pancreatic body measuring 2.4 x 1.9 cm; multiple left upper quadrant necrotic masses seen in the gastrosplenic ligament and adjacent to the splenic flexure of the colon. 02/06/2023 CEA and CA 19-9 elevated 02/07/2023 biopsy left abdominal soft tissue implant-metastatic moderately differentiated adenocarcinoma, MSS, tumor mutation burden 4, HRD signature negative, [id number] 02/10/2023 CTs neck and chest-no metastatic disease.  Large cystic appearing pancreatic mass and adjacent cystic/necrotic peritoneal lesions. Cycle 1 NALIRIFOX 02/18/2023 Cycle 2 NALIRIFOX 03/06/2023 Cycle 3 NALIRIFOX 03/18/2023 Cycle 4 NALIRIFOX 04/01/2023 Cycle 5 NALIRIFOX 04/14/2023, doses adjusted for weight loss CT abdomen/pelvis 04/28/2023: Mild decrease in size pancreas body/tail mass and peritoneal implants.  New small volume splenic infarct, fistulous communication between the pancreas body mass and gastric body Cycle 6 NALIRIFOX 05/13/2023, irinotecan  and oxaliplatin  dose reduced Cycle 7 NALIRIFOX 05/28/2023 Cycle 8 NALIRIFOX 06/16/2023 Cycle 9 NALIRIFOX 07/15/2023 Cycle 10 NALIRIFOX 08/12/2023 CT abdomen/pelvis 09/02/2023: Mild decrease in the pancreas mass and left peritoneal implant, no evidence of disease progression Cycle 11 NALIRIFOX 09/09/2023 Cycle 12 NALIRIFOX 10/07/2023 Cycle 13 NALIRIFOX 11/04/2023 CTs 11/21/2023-enlargement of pancreatic tail mass.  Enlargement of a satellite lesion or fluid collection in the left upper quadrant. 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 Alms  appears stable.  He has completed 13 cycles of NALIRIFOX, most recently on a 4-week schedule.  He continues to tolerate treatment well.  CA 19-9 slowly rising over the past few months.  Recent restaging CTs show evidence of mild progression of the pancreas tail mass and a left upper quadrant satellite lesion.  Dr. Cloretta reviewed the results/images with Mr. Odland and his wife.  We discussed options to include changing treatment back to every 2 weeks versus beginning a different systemic therapy.  He would like to continue NALIRIFOX and adjust to a 2-week schedule.  We reviewed the overall plan is to complete 4 cycles prior to restaging CTs.  He agrees with the above.  Plan to proceed with NALIRIFOX today as scheduled.  CBC and chemistry panel reviewed.  Labs adequate for treatment.  He will return for follow-up and the next cycle of chemotherapy in 2 weeks.  We are available to see him sooner if needed.  Patient seen with Dr. Cloretta.    Olam Ned ANP/GNP-BC   12/02/2023  8:35 AM This was a shared with Olam Ned.  We reviewed the restaging CT findings and images with Mr. Kirkpatrick and his wife.  The pancreas mass and left upper quadrant lesion appear larger compared to the CT in July.  However the lesions appear improved compared to when he was diagnosed with metastatic pancreas cancer in December 2024.  There is no new site of metastatic disease.  The CEA has been higher over the past few months, coincident with increasing the interval between chemotherapy cycles.  We discussed treatment options including a treatment break, changing to a different systemic therapy regimen, and continuing NALIRIFOX.  He does not wish to change treatment.  He agrees to continue NALIRIFOX on a 2-week schedule beginning today.  We will plan for close follow-up of the CA 19-9 and a restaging CT after 4 cycles of  NALIRIFOX.  Arvella Cloretta, MD

## 2023-12-02 NOTE — Progress Notes (Signed)
 Patient seen by Olam Ned NP today  Vitals are within treatment parameters:Yes B/P 141/92 its okay  Labs are within treatment parameters: Yes   Treatment plan has been signed: No  Per physician team, Patient is ready for treatment and there are NO modifications to the treatment plan.

## 2023-12-02 NOTE — Patient Instructions (Signed)
 [facility] - A DEPT OF [facility]  Discharge Instructions: Thank you for choosing [facility] to provide your oncology and hematology care.   If you have a lab appointment with the Cancer Center, please go directly to the Cancer Center and check in at the registration area.   Wear comfortable clothing and clothing appropriate for easy access to any Portacath or PICC line.   We strive to give you quality time with your provider. You may need to reschedule your appointment if you arrive late (15 or more minutes).  Arriving late affects you and other patients whose appointments are after yours.  Also, if you miss three or more appointments without notifying the office, you may be dismissed from the clinic at the provider's discretion.      For prescription refill requests, have your pharmacy contact our office and allow 72 hours for refills to be completed.    Today you received the following chemotherapy and/or immunotherapy agents: irinotecan , oxaliplatin , leucovorin , fluorouracil        To help prevent nausea and vomiting after your treatment, we encourage you to take your nausea medication as directed.  BELOW ARE SYMPTOMS THAT SHOULD BE REPORTED IMMEDIATELY: *FEVER GREATER THAN 100.4 F (38 C) OR HIGHER *CHILLS OR SWEATING *NAUSEA AND VOMITING THAT IS NOT CONTROLLED WITH YOUR NAUSEA MEDICATION *UNUSUAL SHORTNESS OF BREATH *UNUSUAL BRUISING OR BLEEDING *URINARY PROBLEMS (pain or burning when urinating, or frequent urination) *BOWEL PROBLEMS (unusual diarrhea, constipation, pain near the anus) TENDERNESS IN MOUTH AND THROAT WITH OR WITHOUT PRESENCE OF ULCERS (sore throat, sores in mouth, or a toothache) UNUSUAL RASH, SWELLING OR PAIN  UNUSUAL VAGINAL DISCHARGE OR ITCHING   Items with * indicate a potential emergency and should be followed up as soon as possible or go to the Emergency Department if any problems should occur.  Please show the  CHEMOTHERAPY ALERT CARD or IMMUNOTHERAPY ALERT CARD at check-in to the Emergency Department and triage nurse.  Should you have questions after your visit or need to cancel or reschedule your appointment, please contact [facility] - A DEPT OF [facility]  Dept: [phone]  and follow the prompts.  Office hours are 8:00 a.m. to 4:30 p.m. Monday - Friday. Please note that voicemails left after 4:00 p.m. may not be returned until the following business day.  We are closed weekends and major holidays. You have access to a nurse at all times for urgent questions. Please call the main number to the clinic Dept: [phone] and follow the prompts.   For any non-urgent questions, you may also contact your provider using MyChart. We now offer e-Visits for anyone 80 and older to request care online for non-urgent symptoms. For details visit [email or web address].   Also download the MyChart app! Go to the app store, search MyChart, open the app, select [place], and log in with your MyChart username and password.

## 2023-12-03 ENCOUNTER — Other Ambulatory Visit: Payer: Self-pay

## 2023-12-03 LAB — CANCER ANTIGEN 19-9: CA 19-9: 398 U/mL — ABNORMAL HIGH (ref 0–35)

## 2023-12-04 ENCOUNTER — Inpatient Hospital Stay

## 2023-12-04 VITALS — BP 142/73 | HR 89 | Temp 98.1°F | Resp 18

## 2023-12-04 DIAGNOSIS — C252 Malignant neoplasm of tail of pancreas: Secondary | ICD-10-CM

## 2023-12-04 NOTE — Patient Instructions (Signed)

## 2023-12-09 ENCOUNTER — Other Ambulatory Visit: Payer: Self-pay | Admitting: Oncology

## 2023-12-15 MED ORDER — SODIUM CHLORIDE 0.9 % IV SOLN
2400.0000 mg/m2 | INTRAVENOUS | Status: DC
  Administered 2023-12-16: 4200 mg via INTRAVENOUS
  Filled 2023-12-15: qty 84

## 2023-12-16 ENCOUNTER — Inpatient Hospital Stay (HOSPITAL_BASED_OUTPATIENT_CLINIC_OR_DEPARTMENT_OTHER): Admitting: Nurse Practitioner

## 2023-12-16 ENCOUNTER — Inpatient Hospital Stay

## 2023-12-16 ENCOUNTER — Encounter: Payer: Self-pay | Admitting: Nurse Practitioner

## 2023-12-16 VITALS — BP 132/90 | HR 83 | Temp 98.0°F | Resp 18

## 2023-12-16 VITALS — BP 119/78 | HR 84 | Temp 98.0°F | Resp 18 | Ht 68.0 in | Wt 160.0 lb

## 2023-12-16 DIAGNOSIS — C252 Malignant neoplasm of tail of pancreas: Secondary | ICD-10-CM

## 2023-12-16 DIAGNOSIS — Z5111 Encounter for antineoplastic chemotherapy: Secondary | ICD-10-CM | POA: Diagnosis not present

## 2023-12-16 LAB — CBC WITH DIFFERENTIAL (CANCER CENTER ONLY)
Abs Immature Granulocytes: 0.02 K/uL (ref 0.00–0.07)
Basophils Absolute: 0 K/uL (ref 0.0–0.1)
Basophils Relative: 1 %
Eosinophils Absolute: 0.1 K/uL (ref 0.0–0.5)
Eosinophils Relative: 1 %
HCT: 32.8 % — ABNORMAL LOW (ref 39.0–52.0)
Hemoglobin: 10.3 g/dL — ABNORMAL LOW (ref 13.0–17.0)
Immature Granulocytes: 0 %
Lymphocytes Relative: 15 %
Lymphs Abs: 0.9 K/uL (ref 0.7–4.0)
MCH: 27.6 pg (ref 26.0–34.0)
MCHC: 31.4 g/dL (ref 30.0–36.0)
MCV: 87.9 fL (ref 80.0–100.0)
Monocytes Absolute: 0.5 K/uL (ref 0.1–1.0)
Monocytes Relative: 8 %
Neutro Abs: 4.2 K/uL (ref 1.7–7.7)
Neutrophils Relative %: 75 %
Platelet Count: 122 K/uL — ABNORMAL LOW (ref 150–400)
RBC: 3.73 MIL/uL — ABNORMAL LOW (ref 4.22–5.81)
RDW: 15.5 % (ref 11.5–15.5)
WBC Count: 5.7 K/uL (ref 4.0–10.5)
nRBC: 0 % (ref 0.0–0.2)

## 2023-12-16 LAB — CMP (CANCER CENTER ONLY)
ALT: 14 U/L (ref 0–44)
AST: 17 U/L (ref 15–41)
Albumin: 3.6 g/dL (ref 3.5–5.0)
Alkaline Phosphatase: 99 U/L (ref 38–126)
Anion gap: 9 (ref 5–15)
BUN: 10 mg/dL (ref 8–23)
CO2: 25 mmol/L (ref 22–32)
Calcium: 9.2 mg/dL (ref 8.9–10.3)
Chloride: 102 mmol/L (ref 98–111)
Creatinine: 0.56 mg/dL — ABNORMAL LOW (ref 0.61–1.24)
GFR, Estimated: >60 mL/min (ref >60)
Glucose, Bld: 132 mg/dL — ABNORMAL HIGH (ref 70–99)
Potassium: 3.9 mmol/L (ref 3.5–5.1)
Sodium: 136 mmol/L (ref 135–145)
Total Bilirubin: 0.3 mg/dL (ref 0.0–1.2)
Total Protein: 6.6 g/dL (ref 6.5–8.1)

## 2023-12-16 MED ORDER — OXALIPLATIN CHEMO INJECTION 100 MG/20ML
45.0000 mg/m2 | Freq: Once | INTRAVENOUS | Status: AC
  Administered 2023-12-16: 80 mg via INTRAVENOUS
  Filled 2023-12-16: qty 16

## 2023-12-16 MED ORDER — SODIUM CHLORIDE 0.9 % IV SOLN
150.0000 mg | Freq: Once | INTRAVENOUS | Status: AC
  Administered 2023-12-16: 150 mg via INTRAVENOUS
  Filled 2023-12-16: qty 150

## 2023-12-16 MED ORDER — ATROPINE SULFATE 1 MG/ML IV SOLN
0.5000 mg | Freq: Once | INTRAVENOUS | Status: AC | PRN
  Administered 2023-12-16: 0.5 mg via INTRAVENOUS
  Filled 2023-12-16: qty 1

## 2023-12-16 MED ORDER — SODIUM CHLORIDE 0.9 % IV SOLN: INTRAVENOUS | Status: DC

## 2023-12-16 MED ORDER — LEUCOVORIN CALCIUM INJECTION 350 MG
400.0000 mg/m2 | Freq: Once | INTRAVENOUS | Status: AC
  Administered 2023-12-16: 704 mg via INTRAVENOUS
  Filled 2023-12-16: qty 35.2

## 2023-12-16 MED ORDER — DEXAMETHASONE SODIUM PHOSPHATE 10 MG/ML IJ SOLN
10.0000 mg | Freq: Once | INTRAMUSCULAR | Status: AC
  Administered 2023-12-16: 10 mg via INTRAVENOUS

## 2023-12-16 MED ORDER — SODIUM CHLORIDE 0.9 % IV SOLN
40.0000 mg/m2 | Freq: Once | INTRAVENOUS | Status: AC
  Administered 2023-12-16: 68.8 mg via INTRAVENOUS
  Filled 2023-12-16: qty 16

## 2023-12-16 MED ORDER — PALONOSETRON HCL INJECTION 0.25 MG/5ML
0.2500 mg | Freq: Once | INTRAVENOUS | Status: AC
  Administered 2023-12-16: 0.25 mg via INTRAVENOUS
  Filled 2023-12-16: qty 5

## 2023-12-16 MED ORDER — DEXTROSE 5 % IV SOLN: INTRAVENOUS | Status: DC

## 2023-12-16 NOTE — Progress Notes (Signed)
 Patient seen by Olam Ned NP today  Vitals are within treatment parameters:Yes   Labs are within treatment parameters: Yes   Treatment plan has been signed: Yes   Per physician team, Patient is ready for treatment and there are NO modifications to the treatment plan.

## 2023-12-16 NOTE — Patient Instructions (Signed)
 [facility] - A DEPT OF [facility]  Discharge Instructions: Thank you for choosing [facility] to provide your oncology and hematology care.   If you have a lab appointment with the Cancer Center, please go directly to the Cancer Center and check in at the registration area.   Wear comfortable clothing and clothing appropriate for easy access to any Portacath or PICC line.   We strive to give you quality time with your provider. You may need to reschedule your appointment if you arrive late (15 or more minutes).  Arriving late affects you and other patients whose appointments are after yours.  Also, if you miss three or more appointments without notifying the office, you may be dismissed from the clinic at the provider's discretion.      For prescription refill requests, have your pharmacy contact our office and allow 72 hours for refills to be completed.    Today you received the following chemotherapy and/or immunotherapy agents: irinotecan , oxaliplatin , leucovorin , fluorouracil        To help prevent nausea and vomiting after your treatment, we encourage you to take your nausea medication as directed.  BELOW ARE SYMPTOMS THAT SHOULD BE REPORTED IMMEDIATELY: *FEVER GREATER THAN 100.4 F (38 C) OR HIGHER *CHILLS OR SWEATING *NAUSEA AND VOMITING THAT IS NOT CONTROLLED WITH YOUR NAUSEA MEDICATION *UNUSUAL SHORTNESS OF BREATH *UNUSUAL BRUISING OR BLEEDING *URINARY PROBLEMS (pain or burning when urinating, or frequent urination) *BOWEL PROBLEMS (unusual diarrhea, constipation, pain near the anus) TENDERNESS IN MOUTH AND THROAT WITH OR WITHOUT PRESENCE OF ULCERS (sore throat, sores in mouth, or a toothache) UNUSUAL RASH, SWELLING OR PAIN  UNUSUAL VAGINAL DISCHARGE OR ITCHING   Items with * indicate a potential emergency and should be followed up as soon as possible or go to the Emergency Department if any problems should occur.  Please show the  CHEMOTHERAPY ALERT CARD or IMMUNOTHERAPY ALERT CARD at check-in to the Emergency Department and triage nurse.  Should you have questions after your visit or need to cancel or reschedule your appointment, please contact [facility] - A DEPT OF [facility]  Dept: [phone]  and follow the prompts.  Office hours are 8:00 a.m. to 4:30 p.m. Monday - Friday. Please note that voicemails left after 4:00 p.m. may not be returned until the following business day.  We are closed weekends and major holidays. You have access to a nurse at all times for urgent questions. Please call the main number to the clinic Dept: [phone] and follow the prompts.   For any non-urgent questions, you may also contact your provider using MyChart. We now offer e-Visits for anyone 80 and older to request care online for non-urgent symptoms. For details visit [email or web address].   Also download the MyChart app! Go to the app store, search MyChart, open the app, select [place], and log in with your MyChart username and password.

## 2023-12-16 NOTE — Progress Notes (Signed)
 [facility] OFFICE PROGRESS NOTE   Diagnosis: Pancreas cancer  INTERVAL HISTORY:   Ralph Davis returns as scheduled.  He completed a cycle of NALIRIFOX 12/02/2023.  He denies nausea/vomiting.  He had a single loose stool.  He recently noted a sore spot along the right buccal mucosa.  He thinks he may have bitten this area.  He did not experience cold sensitivity.  No numbness or tingling in the hands or feet.  He denies pain.  Appetite is improving.  He has intermittent fatigue.  Objective:  Vital signs in last 24 hours:  Blood pressure 119/78, pulse 84, temperature 98 F (36.7 C), temperature source Temporal, resp. rate 18, height 5' 8 (1.727 m), weight 160 lb (72.6 kg), SpO2 100%.    HEENT: No thrush or ulcers. Resp: Lungs clear bilaterally. Cardio: Regular rate and rhythm. GI: No hepatosplenomegaly.  Nontender. Vascular: No leg edema. Skin: Palms without erythema. Port-A-Cath without erythema.  Lab Results:  Lab Results  Component Value Date   WBC 5.7 12/16/2023   HGB 10.3 (L) 12/16/2023   HCT 32.8 (L) 12/16/2023   MCV 87.9 12/16/2023   PLT 122 (L) 12/16/2023   NEUTROABS 4.2 12/16/2023    Imaging:  No results found.  Medications: I have reviewed the patient's current medications.  Assessment/Plan: Pancreas cancer  bloating/constipation/anorexia and weight loss/back pain EGD 01/20/2023-diffuse moderate mucosal changes characterized by congestion and erythema in the gastric antrum; a large ulcerated and fungating mass with stigmata of recent bleeding in the duodenal bulb; segmental moderate inflammation characterized by congestion and erythema found at the pylorus.  There was no evidence of dysplasia or malignancy in the duodenum biopsies.   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 02/06/2023 CEA and CA 19-9 elevated 02/07/2023 biopsy left abdominal soft tissue implant-metastatic moderately differentiated adenocarcinoma, MSS, tumor mutation burden 4, HRD signature negative, [id number] 02/10/2023 CTs neck and chest-no metastatic disease.  Large cystic appearing pancreatic mass and adjacent cystic/necrotic peritoneal lesions. Cycle 1 NALIRIFOX 02/18/2023 Cycle 2 NALIRIFOX 03/06/2023 Cycle 3 NALIRIFOX 03/18/2023 Cycle 4 NALIRIFOX 04/01/2023 Cycle 5 NALIRIFOX 04/14/2023, doses adjusted for weight loss CT abdomen/pelvis 04/28/2023: Mild decrease in size pancreas body/tail mass and peritoneal implants.  New small volume splenic infarct, fistulous communication between the pancreas body mass and gastric body Cycle 6 NALIRIFOX 05/13/2023, irinotecan  and oxaliplatin  dose reduced Cycle 7 NALIRIFOX 05/28/2023 Cycle 8 NALIRIFOX 06/16/2023 Cycle 9 NALIRIFOX 07/15/2023 Cycle 10 NALIRIFOX 08/12/2023 CT abdomen/pelvis 09/02/2023: Mild decrease in the pancreas mass and left peritoneal implant, no evidence of disease progression Cycle 11 NALIRIFOX 09/09/2023 Cycle 12 NALIRIFOX 10/07/2023 Cycle 13 NALIRIFOX 11/04/2023 CTs 11/21/2023-enlargement of pancreatic tail mass.  Enlargement of a satellite lesion or fluid collection in the left upper quadrant. 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  He continues NALIRIFOX every 2  weeks.  He seems to be tolerating well.  Plan to proceed with another cycle today.  CBC and chemistry panel reviewed.  Labs adequate for treatment.  He will return for follow-up and the next cycle of NALIRIFOX in 2 weeks.  We are available to see him sooner if needed.    Olam Ned ANP/GNP-BC   12/16/2023  9:39 AM

## 2023-12-16 NOTE — Patient Instructions (Signed)

## 2023-12-17 LAB — CANCER ANTIGEN 19-9: CA 19-9: 460 U/mL — ABNORMAL HIGH (ref 0–35)

## 2023-12-18 ENCOUNTER — Inpatient Hospital Stay

## 2023-12-18 VITALS — BP 115/70 | HR 79 | Temp 98.2°F | Resp 16

## 2023-12-18 DIAGNOSIS — C252 Malignant neoplasm of tail of pancreas: Secondary | ICD-10-CM

## 2023-12-20 ENCOUNTER — Other Ambulatory Visit: Payer: Self-pay

## 2023-12-23 ENCOUNTER — Other Ambulatory Visit: Payer: Self-pay | Admitting: Oncology

## 2023-12-30 ENCOUNTER — Inpatient Hospital Stay

## 2023-12-30 ENCOUNTER — Inpatient Hospital Stay (HOSPITAL_BASED_OUTPATIENT_CLINIC_OR_DEPARTMENT_OTHER): Admitting: Oncology

## 2023-12-30 ENCOUNTER — Inpatient Hospital Stay: Attending: Nurse Practitioner

## 2023-12-30 VITALS — BP 133/71 | HR 77 | Temp 97.9°F | Resp 16 | Wt 156.2 lb

## 2023-12-30 VITALS — BP 145/80 | HR 78 | Temp 98.1°F | Resp 16

## 2023-12-30 DIAGNOSIS — Z5111 Encounter for antineoplastic chemotherapy: Secondary | ICD-10-CM | POA: Diagnosis present

## 2023-12-30 DIAGNOSIS — C252 Malignant neoplasm of tail of pancreas: Secondary | ICD-10-CM

## 2023-12-30 DIAGNOSIS — G620 Drug-induced polyneuropathy: Secondary | ICD-10-CM | POA: Diagnosis not present

## 2023-12-30 DIAGNOSIS — I10 Essential (primary) hypertension: Secondary | ICD-10-CM | POA: Insufficient documentation

## 2023-12-30 DIAGNOSIS — Z452 Encounter for adjustment and management of vascular access device: Secondary | ICD-10-CM | POA: Diagnosis not present

## 2023-12-30 DIAGNOSIS — C258 Malignant neoplasm of overlapping sites of pancreas: Secondary | ICD-10-CM | POA: Diagnosis present

## 2023-12-30 DIAGNOSIS — D696 Thrombocytopenia, unspecified: Secondary | ICD-10-CM | POA: Diagnosis not present

## 2023-12-30 LAB — CBC WITH DIFFERENTIAL (CANCER CENTER ONLY)
Abs Immature Granulocytes: 0.01 K/uL (ref 0.00–0.07)
Basophils Absolute: 0 K/uL (ref 0.0–0.1)
Basophils Relative: 1 %
Eosinophils Absolute: 0.1 K/uL (ref 0.0–0.5)
Eosinophils Relative: 1 %
HCT: 32.5 % — ABNORMAL LOW (ref 39.0–52.0)
Hemoglobin: 10.2 g/dL — ABNORMAL LOW (ref 13.0–17.0)
Immature Granulocytes: 0 %
Lymphocytes Relative: 16 %
Lymphs Abs: 0.8 K/uL (ref 0.7–4.0)
MCH: 27.8 pg (ref 26.0–34.0)
MCHC: 31.4 g/dL (ref 30.0–36.0)
MCV: 88.6 fL (ref 80.0–100.0)
Monocytes Absolute: 0.4 K/uL (ref 0.1–1.0)
Monocytes Relative: 8 %
Neutro Abs: 3.6 K/uL (ref 1.7–7.7)
Neutrophils Relative %: 74 %
Platelet Count: 104 K/uL — ABNORMAL LOW (ref 150–400)
RBC: 3.67 MIL/uL — ABNORMAL LOW (ref 4.22–5.81)
RDW: 15.9 % — ABNORMAL HIGH (ref 11.5–15.5)
WBC Count: 4.9 K/uL (ref 4.0–10.5)
nRBC: 0 % (ref 0.0–0.2)

## 2023-12-30 LAB — CMP (CANCER CENTER ONLY)
ALT: 16 U/L (ref 0–44)
AST: 15 U/L (ref 15–41)
Albumin: 3.7 g/dL (ref 3.5–5.0)
Alkaline Phosphatase: 97 U/L (ref 38–126)
Anion gap: 10 (ref 5–15)
BUN: 9 mg/dL (ref 8–23)
CO2: 26 mmol/L (ref 22–32)
Calcium: 9.3 mg/dL (ref 8.9–10.3)
Chloride: 104 mmol/L (ref 98–111)
Creatinine: 0.54 mg/dL — ABNORMAL LOW (ref 0.61–1.24)
GFR, Estimated: >60 mL/min (ref >60)
Glucose, Bld: 172 mg/dL — ABNORMAL HIGH (ref 70–99)
Potassium: 3.5 mmol/L (ref 3.5–5.1)
Sodium: 140 mmol/L (ref 135–145)
Total Bilirubin: 0.3 mg/dL (ref 0.0–1.2)
Total Protein: 6.5 g/dL (ref 6.5–8.1)

## 2023-12-30 MED ORDER — SODIUM CHLORIDE 0.9 % IV SOLN: INTRAVENOUS | Status: DC

## 2023-12-30 MED ORDER — LEUCOVORIN CALCIUM INJECTION 350 MG
400.0000 mg/m2 | Freq: Once | INTRAVENOUS | Status: AC
  Administered 2023-12-30: 704 mg via INTRAVENOUS
  Filled 2023-12-30: qty 35.2

## 2023-12-30 MED ORDER — ATROPINE SULFATE 1 MG/ML IV SOLN
0.5000 mg | Freq: Once | INTRAVENOUS | Status: AC | PRN
  Administered 2023-12-30: 0.5 mg via INTRAVENOUS
  Filled 2023-12-30: qty 1

## 2023-12-30 MED ORDER — SODIUM CHLORIDE 0.9 % IV SOLN
40.0000 mg/m2 | Freq: Once | INTRAVENOUS | Status: AC
  Administered 2023-12-30: 68.8 mg via INTRAVENOUS
  Filled 2023-12-30: qty 16

## 2023-12-30 MED ORDER — OXALIPLATIN CHEMO INJECTION 100 MG/20ML
45.0000 mg/m2 | Freq: Once | INTRAVENOUS | Status: AC
  Administered 2023-12-30: 80 mg via INTRAVENOUS
  Filled 2023-12-30: qty 16

## 2023-12-30 MED ORDER — PALONOSETRON HCL INJECTION 0.25 MG/5ML
0.2500 mg | Freq: Once | INTRAVENOUS | Status: AC
  Administered 2023-12-30: 0.25 mg via INTRAVENOUS
  Filled 2023-12-30: qty 5

## 2023-12-30 MED ORDER — DEXAMETHASONE SODIUM PHOSPHATE 10 MG/ML IJ SOLN
10.0000 mg | Freq: Once | INTRAMUSCULAR | Status: AC
  Administered 2023-12-30: 10 mg via INTRAVENOUS

## 2023-12-30 MED ORDER — SODIUM CHLORIDE 0.9 % IV SOLN
2400.0000 mg/m2 | INTRAVENOUS | Status: DC
  Administered 2023-12-30: 4200 mg via INTRAVENOUS
  Filled 2023-12-30: qty 84

## 2023-12-30 MED ORDER — DEXTROSE 5 % IV SOLN: INTRAVENOUS | Status: DC

## 2023-12-30 MED ORDER — SODIUM CHLORIDE 0.9 % IV SOLN
150.0000 mg | Freq: Once | INTRAVENOUS | Status: AC
  Administered 2023-12-30: 150 mg via INTRAVENOUS
  Filled 2023-12-30: qty 150

## 2023-12-30 NOTE — Patient Instructions (Signed)

## 2023-12-30 NOTE — Patient Instructions (Signed)
 [facility] - A DEPT OF [facility]  Discharge Instructions: Thank you for choosing [facility] to provide your oncology and hematology care.   If you have a lab appointment with the Cancer Center, please go directly to the Cancer Center and check in at the registration area.   Wear comfortable clothing and clothing appropriate for easy access to any Portacath or PICC line.   We strive to give you quality time with your provider. You may need to reschedule your appointment if you arrive late (15 or more minutes).  Arriving late affects you and other patients whose appointments are after yours.  Also, if you miss three or more appointments without notifying the office, you may be dismissed from the clinic at the provider's discretion.      For prescription refill requests, have your pharmacy contact our office and allow 72 hours for refills to be completed.    Today you received the following chemotherapy and/or immunotherapy agents: irinotecan , oxaliplatin , leucovorin , fluorouracil        To help prevent nausea and vomiting after your treatment, we encourage you to take your nausea medication as directed.  BELOW ARE SYMPTOMS THAT SHOULD BE REPORTED IMMEDIATELY: *FEVER GREATER THAN 100.4 F (38 C) OR HIGHER *CHILLS OR SWEATING *NAUSEA AND VOMITING THAT IS NOT CONTROLLED WITH YOUR NAUSEA MEDICATION *UNUSUAL SHORTNESS OF BREATH *UNUSUAL BRUISING OR BLEEDING *URINARY PROBLEMS (pain or burning when urinating, or frequent urination) *BOWEL PROBLEMS (unusual diarrhea, constipation, pain near the anus) TENDERNESS IN MOUTH AND THROAT WITH OR WITHOUT PRESENCE OF ULCERS (sore throat, sores in mouth, or a toothache) UNUSUAL RASH, SWELLING OR PAIN  UNUSUAL VAGINAL DISCHARGE OR ITCHING   Items with * indicate a potential emergency and should be followed up as soon as possible or go to the Emergency Department if any problems should occur.  Please show the  CHEMOTHERAPY ALERT CARD or IMMUNOTHERAPY ALERT CARD at check-in to the Emergency Department and triage nurse.  Should you have questions after your visit or need to cancel or reschedule your appointment, please contact [facility] - A DEPT OF [facility]  Dept: [phone]  and follow the prompts.  Office hours are 8:00 a.m. to 4:30 p.m. Monday - Friday. Please note that voicemails left after 4:00 p.m. may not be returned until the following business day.  We are closed weekends and major holidays. You have access to a nurse at all times for urgent questions. Please call the main number to the clinic Dept: [phone] and follow the prompts.   For any non-urgent questions, you may also contact your provider using MyChart. We now offer e-Visits for anyone 80 and older to request care online for non-urgent symptoms. For details visit [email or web address].   Also download the MyChart app! Go to the app store, search MyChart, open the app, select [place], and log in with your MyChart username and password.

## 2023-12-30 NOTE — Progress Notes (Signed)
 [facility] OFFICE PROGRESS NOTE   Diagnosis: Pancreas cancer  INTERVAL HISTORY:   Ralph Davis completed another cycle of NALIRIFOX on 12/16/2023.  No nausea/vomiting, mouth sores, diarrhea, or neuropathy symptoms.  No pain.  He reports altered taste.  Objective:  Vital signs in last 24 hours:  Blood pressure 133/71, pulse 77, temperature 97.9 F (36.6 C), temperature source Temporal, resp. rate 16, weight 156 lb 3.2 oz (70.9 kg), SpO2 100%.    HEENT: No thrush or ulcers Resp: Lungs clear bilaterally Cardio: Regular rate and rhythm GI: No hepatosplenomegaly, no mass, no apparent ascites Vascular: No leg edema Neuro: Mild to moderate loss of vibratory sense at the fingertips bilaterally  Portacath/PICC-without erythema  Lab Results:  Lab Results  Component Value Date   WBC 4.9 12/30/2023   HGB 10.2 (L) 12/30/2023   HCT 32.5 (L) 12/30/2023   MCV 88.6 12/30/2023   PLT 104 (L) 12/30/2023   NEUTROABS 3.6 12/30/2023    CMP  Lab Results  Component Value Date   NA 136 12/16/2023   K 3.9 12/16/2023   CL 102 12/16/2023   CO2 25 12/16/2023   GLUCOSE 132 (H) 12/16/2023   BUN 10 12/16/2023   CREATININE 0.56 (L) 12/16/2023   CALCIUM  9.2 12/16/2023   PROT 6.6 12/16/2023   ALBUMIN  3.6 12/16/2023   AST 17 12/16/2023   ALT 14 12/16/2023   ALKPHOS 99 12/16/2023   BILITOT 0.3 12/16/2023   GFRNONAA >60 12/16/2023   GFRAA >60 05/08/2019    Lab Results  Component Value Date   CEA 75.60 (H) 02/06/2023   RJW800 460 (H) 12/16/2023     Medications: I have reviewed the patient's current medications.   Assessment/Plan: Pancreas cancer  bloating/constipation/anorexia and weight loss/back pain EGD 01/20/2023-diffuse moderate mucosal changes characterized by congestion and erythema in the gastric antrum; a large ulcerated and fungating mass with stigmata of recent bleeding in the duodenal bulb; segmental moderate inflammation characterized by congestion and  erythema found at the pylorus.  There was no evidence of dysplasia or malignancy in the duodenum biopsies.   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 02/06/2023 CEA and CA 19-9 elevated 02/07/2023 biopsy left abdominal soft tissue implant-metastatic moderately differentiated adenocarcinoma, MSS, tumor mutation burden 4, HRD signature negative, [id number] 02/10/2023 CTs neck and chest-no metastatic disease.  Large cystic appearing pancreatic mass and adjacent cystic/necrotic peritoneal lesions. Cycle 1 NALIRIFOX 02/18/2023 Cycle 2 NALIRIFOX 03/06/2023 Cycle 3 NALIRIFOX 03/18/2023 Cycle 4 NALIRIFOX 04/01/2023 Cycle 5 NALIRIFOX 04/14/2023, doses adjusted for weight loss CT abdomen/pelvis 04/28/2023: Mild decrease in size pancreas body/tail mass and peritoneal implants.  New small volume splenic infarct, fistulous communication between the pancreas body mass and gastric body Cycle 6 NALIRIFOX 05/13/2023, irinotecan  and oxaliplatin  dose reduced Cycle 7 NALIRIFOX 05/28/2023 Cycle 8 NALIRIFOX 06/16/2023 Cycle 9 NALIRIFOX 07/15/2023 Cycle 10 NALIRIFOX 08/12/2023 CT abdomen/pelvis 09/02/2023: Mild decrease in the pancreas mass and left peritoneal implant, no evidence of disease progression Cycle 11 NALIRIFOX 09/09/2023 Cycle 12 NALIRIFOX 10/07/2023 Cycle 13 NALIRIFOX 11/04/2023 CTs 11/21/2023-enlargement of pancreatic tail mass.  Enlargement of a satellite lesion or fluid collection in the left upper quadrant. 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  He continues to tolerate chemotherapy well.  He has evidence of mild oxaliplatin  neuropathy.  There is no clinical evidence of disease progression.  He will complete another cycle of NALIRIFOX today.  He will return for an office visit and chemotherapy in 3 weeks.  He will undergo a restaging CT evaluation after the next cycle of chemotherapy.  He has mild thrombocytopenia.  He will call for a fever or bleeding.  Arley Hof, MD  12/30/2023  8:46 AM

## 2023-12-30 NOTE — Progress Notes (Signed)
 Patient seen by Dr. Arley Hof today  Vitals are within treatment parameters:Yes   Labs are within treatment parameters: Yes   Treatment plan has been signed: Yes   Per physician team, Patient is ready for treatment and there are NO modifications to the treatment plan.

## 2024-01-01 ENCOUNTER — Inpatient Hospital Stay

## 2024-01-01 VITALS — BP 122/72 | HR 81 | Temp 98.1°F | Resp 18

## 2024-01-01 DIAGNOSIS — Z5111 Encounter for antineoplastic chemotherapy: Secondary | ICD-10-CM | POA: Diagnosis not present

## 2024-01-01 DIAGNOSIS — C252 Malignant neoplasm of tail of pancreas: Secondary | ICD-10-CM

## 2024-01-01 NOTE — Patient Instructions (Signed)

## 2024-01-05 ENCOUNTER — Encounter: Payer: Self-pay | Admitting: *Deleted

## 2024-01-05 NOTE — Progress Notes (Signed)
 Pt called and informed staff that he is moving to [facility] [place] in mid December and has contacted Dr Charlena Sees for care. Records and referral faxed to [facility] at [phone]

## 2024-01-17 ENCOUNTER — Other Ambulatory Visit: Payer: Self-pay | Admitting: Oncology

## 2024-01-20 ENCOUNTER — Inpatient Hospital Stay

## 2024-01-20 ENCOUNTER — Inpatient Hospital Stay: Admitting: Nurse Practitioner

## 2024-01-20 ENCOUNTER — Inpatient Hospital Stay: Attending: Nurse Practitioner

## 2024-01-20 ENCOUNTER — Encounter: Payer: Self-pay | Admitting: Nurse Practitioner

## 2024-01-20 VITALS — BP 134/66 | HR 81 | Temp 98.1°F | Resp 18

## 2024-01-20 VITALS — BP 119/76 | HR 97 | Temp 98.4°F | Resp 16 | Wt 154.9 lb

## 2024-01-20 DIAGNOSIS — C252 Malignant neoplasm of tail of pancreas: Secondary | ICD-10-CM

## 2024-01-20 DIAGNOSIS — D735 Infarction of spleen: Secondary | ICD-10-CM | POA: Insufficient documentation

## 2024-01-20 DIAGNOSIS — D696 Thrombocytopenia, unspecified: Secondary | ICD-10-CM | POA: Diagnosis not present

## 2024-01-20 DIAGNOSIS — C258 Malignant neoplasm of overlapping sites of pancreas: Secondary | ICD-10-CM | POA: Diagnosis present

## 2024-01-20 DIAGNOSIS — Z5111 Encounter for antineoplastic chemotherapy: Secondary | ICD-10-CM | POA: Insufficient documentation

## 2024-01-20 DIAGNOSIS — I10 Essential (primary) hypertension: Secondary | ICD-10-CM | POA: Insufficient documentation

## 2024-01-20 DIAGNOSIS — G620 Drug-induced polyneuropathy: Secondary | ICD-10-CM | POA: Insufficient documentation

## 2024-01-20 LAB — CBC WITH DIFFERENTIAL (CANCER CENTER ONLY)
Abs Immature Granulocytes: 0.01 K/uL (ref 0.00–0.07)
Basophils Absolute: 0.1 K/uL (ref 0.0–0.1)
Basophils Relative: 1 %
Eosinophils Absolute: 0.1 K/uL (ref 0.0–0.5)
Eosinophils Relative: 1 %
HCT: 34.9 % — ABNORMAL LOW (ref 39.0–52.0)
Hemoglobin: 11 g/dL — ABNORMAL LOW (ref 13.0–17.0)
Immature Granulocytes: 0 %
Lymphocytes Relative: 19 %
Lymphs Abs: 1.1 K/uL (ref 0.7–4.0)
MCH: 28 pg (ref 26.0–34.0)
MCHC: 31.5 g/dL (ref 30.0–36.0)
MCV: 88.8 fL (ref 80.0–100.0)
Monocytes Absolute: 0.6 K/uL (ref 0.1–1.0)
Monocytes Relative: 11 %
Neutro Abs: 3.9 K/uL (ref 1.7–7.7)
Neutrophils Relative %: 68 %
Platelet Count: 120 K/uL — ABNORMAL LOW (ref 150–400)
RBC: 3.93 MIL/uL — ABNORMAL LOW (ref 4.22–5.81)
RDW: 17.5 % — ABNORMAL HIGH (ref 11.5–15.5)
WBC Count: 5.8 K/uL (ref 4.0–10.5)
nRBC: 0 % (ref 0.0–0.2)

## 2024-01-20 LAB — CMP (CANCER CENTER ONLY)
ALT: 12 U/L (ref 0–44)
AST: 19 U/L (ref 15–41)
Albumin: 3.8 g/dL (ref 3.5–5.0)
Alkaline Phosphatase: 97 U/L (ref 38–126)
Anion gap: 11 (ref 5–15)
BUN: 17 mg/dL (ref 8–23)
CO2: 25 mmol/L (ref 22–32)
Calcium: 9.5 mg/dL (ref 8.9–10.3)
Chloride: 103 mmol/L (ref 98–111)
Creatinine: 0.49 mg/dL — ABNORMAL LOW (ref 0.61–1.24)
GFR, Estimated: >60 mL/min (ref >60)
Glucose, Bld: 164 mg/dL — ABNORMAL HIGH (ref 70–99)
Potassium: 3.8 mmol/L (ref 3.5–5.1)
Sodium: 139 mmol/L (ref 135–145)
Total Bilirubin: 0.4 mg/dL (ref 0.0–1.2)
Total Protein: 7 g/dL (ref 6.5–8.1)

## 2024-01-20 MED ORDER — PALONOSETRON HCL INJECTION 0.25 MG/5ML
0.2500 mg | Freq: Once | INTRAVENOUS | Status: AC
  Administered 2024-01-20: 0.25 mg via INTRAVENOUS
  Filled 2024-01-20: qty 5

## 2024-01-20 MED ORDER — PANTOPRAZOLE SODIUM 40 MG PO TBEC: 40.0000 mg | DELAYED_RELEASE_TABLET | Freq: Every day | ORAL | 1 refills | Status: AC

## 2024-01-20 MED ORDER — DEXTROSE 5 % IV SOLN: INTRAVENOUS | Status: DC

## 2024-01-20 MED ORDER — SODIUM CHLORIDE 0.9 % IV SOLN
150.0000 mg | Freq: Once | INTRAVENOUS | Status: AC
  Administered 2024-01-20: 150 mg via INTRAVENOUS
  Filled 2024-01-20: qty 150

## 2024-01-20 MED ORDER — LEUCOVORIN CALCIUM INJECTION 350 MG
400.0000 mg/m2 | Freq: Once | INTRAVENOUS | Status: AC
  Administered 2024-01-20: 704 mg via INTRAVENOUS
  Filled 2024-01-20: qty 35.2

## 2024-01-20 MED ORDER — OXALIPLATIN CHEMO INJECTION 100 MG/20ML
45.0000 mg/m2 | Freq: Once | INTRAVENOUS | Status: AC
  Administered 2024-01-20: 80 mg via INTRAVENOUS
  Filled 2024-01-20: qty 16

## 2024-01-20 MED ORDER — SODIUM CHLORIDE 0.9 % IV SOLN
2400.0000 mg/m2 | INTRAVENOUS | Status: DC
  Administered 2024-01-20: 4200 mg via INTRAVENOUS
  Filled 2024-01-20: qty 84

## 2024-01-20 MED ORDER — SODIUM CHLORIDE 0.9 % IV SOLN: INTRAVENOUS | Status: DC

## 2024-01-20 MED ORDER — ATROPINE SULFATE 1 MG/ML IV SOLN
0.5000 mg | Freq: Once | INTRAVENOUS | Status: AC | PRN
  Administered 2024-01-20: 0.5 mg via INTRAVENOUS
  Filled 2024-01-20: qty 1

## 2024-01-20 MED ORDER — DEXAMETHASONE SODIUM PHOSPHATE 10 MG/ML IJ SOLN
10.0000 mg | Freq: Once | INTRAMUSCULAR | Status: AC
  Administered 2024-01-20: 10 mg via INTRAVENOUS

## 2024-01-20 MED ORDER — SODIUM CHLORIDE 0.9 % IV SOLN
40.0000 mg/m2 | Freq: Once | INTRAVENOUS | Status: AC
  Administered 2024-01-20: 68.8 mg via INTRAVENOUS
  Filled 2024-01-20: qty 16

## 2024-01-20 NOTE — Progress Notes (Signed)
 [facility] OFFICE PROGRESS NOTE   Diagnosis: Pancreas cancer  INTERVAL HISTORY:   Ralph Davis returns as scheduled.  He completed another cycle of NALIRIFOX 12/30/2023.  He denies nausea/vomiting.  No mouth sores.  No diarrhea.  He noted constipation for a few days after treatment.  No issues over the past 2 weeks.  He did not experience cold sensitivity.  Baseline numbness/tingling is improving.  He has an occasional slight pain at the left upper abdomen, resolves over a few seconds to a minute without intervention.  He has a good appetite and overall good energy level.  Objective:  Vital signs in last 24 hours:  Blood pressure 119/76, pulse 97, temperature 98.4 F (36.9 C), temperature source Temporal, resp. rate 16, weight 154 lb 14.4 oz (70.3 kg), SpO2 99%.    HEENT: No thrush or ulcers. Resp: Lungs clear bilaterally. Cardio: Regular rate and rhythm. GI: Soft and nontender.  No mass.  No hepatosplenomegaly.  No apparent ascites. Vascular: No leg edema.  Calves soft and nontender. Skin: No rash.  Palms without erythema. Port-A-Cath without erythema.  Lab Results:  Lab Results  Component Value Date   WBC 5.8 01/20/2024   HGB 11.0 (L) 01/20/2024   HCT 34.9 (L) 01/20/2024   MCV 88.8 01/20/2024   PLT 120 (L) 01/20/2024   NEUTROABS 3.9 01/20/2024    Imaging:  No results found.  Medications: I have reviewed the patient's current medications.  Assessment/Plan: Pancreas cancer  bloating/constipation/anorexia and weight loss/back pain EGD 01/20/2023-diffuse moderate mucosal changes characterized by congestion and erythema in the gastric antrum; a large ulcerated and fungating mass with stigmata of recent bleeding in the duodenal bulb; segmental moderate inflammation characterized by congestion and erythema found at the pylorus.  There was no evidence of dysplasia or malignancy in the duodenum biopsies.   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 02/06/2023 CEA and CA 19-9 elevated 02/07/2023 biopsy left abdominal soft tissue implant-metastatic moderately differentiated adenocarcinoma, MSS, tumor mutation burden 4, HRD signature negative, [id number] 02/10/2023 CTs neck and chest-no metastatic disease.  Large cystic appearing pancreatic mass and adjacent cystic/necrotic peritoneal lesions. Cycle 1 NALIRIFOX 02/18/2023 Cycle 2 NALIRIFOX 03/06/2023 Cycle 3 NALIRIFOX 03/18/2023 Cycle 4 NALIRIFOX 04/01/2023 Cycle 5 NALIRIFOX 04/14/2023, doses adjusted for weight loss CT abdomen/pelvis 04/28/2023: Mild decrease in size pancreas body/tail mass and peritoneal implants.  New small volume splenic infarct, fistulous communication between the pancreas body mass and gastric body Cycle 6 NALIRIFOX 05/13/2023, irinotecan  and oxaliplatin  dose reduced Cycle 7 NALIRIFOX 05/28/2023 Cycle 8 NALIRIFOX 06/16/2023 Cycle 9 NALIRIFOX 07/15/2023 Cycle 10 NALIRIFOX 08/12/2023 CT abdomen/pelvis 09/02/2023: Mild decrease in the pancreas mass and left peritoneal implant, no evidence of disease progression Cycle 11 NALIRIFOX 09/09/2023 Cycle 12 NALIRIFOX 10/07/2023 Cycle 13 NALIRIFOX 11/04/2023 CTs 11/21/2023-enlargement of pancreatic tail mass.  Enlargement of a satellite lesion or fluid collection in the left upper quadrant. Cycle 14 NALIRIFOX 12/02/2023, treatment schedule adjusted to every 2 weeks going forward Cycle 15 NALIRIFOX 12/16/2023 Cycle 16 NALIRIFOX 12/30/2023  Cycle 17  NALIRIFOX 01/20/2024 Hypertension Hypersalivation  following cycle 3 NALIRIFOX-likely related to irinotecan , improved with scopolamine  patch Oxaliplatin  neuropathy-loss of vibratory sense on exam 09/09/2023, 12/30/2023      Disposition: Mr. Mckinney appears stable.  He continues NALIRIFOX.  He is tolerating treatment well.  There is no clinical evidence of disease progression.  We will follow-up on the CA 19-9 from today.  Plan to proceed with another cycle of NALIRIFOX today as scheduled.    CBC and chemistry panel reviewed.  Labs are adequate for treatment.  He has stable mild thrombocytopenia.  He will contact the office with bleeding.    He is relocating to [place]  in just under 2 weeks.  He confirms he has an appointment with medical oncology already in place.  He will have restaging CTs here early next week.  We will forward results to the new medical oncologist.  We are available to see him in the future if he returns to this area.  Patient seen with Dr. Cloretta.  Olam Ned ANP/GNP-BC   01/20/2024  8:57 AM  This was a shared visit with Olam Ned.  Mr. Rickman appears unchanged.  He will complete another cycle of NALIRIFOX today.  He is scheduled to relocate to [place]  within the next 2 weeks.  We will refer him for a restaging CT prior to leaving [place].  We will forward the CT results and images to his new oncologist in [place] .  Mr Frenette is not scheduled for a follow-up appointment in the oncology clinic here.  We are available to see him as needed.  I am available to communicate with his oncologist in [place] .  Arvella Cloretta, MD

## 2024-01-20 NOTE — Patient Instructions (Signed)
 [facility] - A DEPT OF [facility]  Discharge Instructions: Thank you for choosing [facility] to provide your oncology and hematology care.   If you have a lab appointment with the Cancer Center, please go directly to the Cancer Center and check in at the registration area.   Wear comfortable clothing and clothing appropriate for easy access to any Portacath or PICC line.   We strive to give you quality time with your provider. You may need to reschedule your appointment if you arrive late (15 or more minutes).  Arriving late affects you and other patients whose appointments are after yours.  Also, if you miss three or more appointments without notifying the office, you may be dismissed from the clinic at the provider's discretion.      For prescription refill requests, have your pharmacy contact our office and allow 72 hours for refills to be completed.    Today you received the following chemotherapy and/or immunotherapy agents: irinotecan , oxaliplatin , leucovorin , fluorouracil        To help prevent nausea and vomiting after your treatment, we encourage you to take your nausea medication as directed.  BELOW ARE SYMPTOMS THAT SHOULD BE REPORTED IMMEDIATELY: *FEVER GREATER THAN 100.4 F (38 C) OR HIGHER *CHILLS OR SWEATING *NAUSEA AND VOMITING THAT IS NOT CONTROLLED WITH YOUR NAUSEA MEDICATION *UNUSUAL SHORTNESS OF BREATH *UNUSUAL BRUISING OR BLEEDING *URINARY PROBLEMS (pain or burning when urinating, or frequent urination) *BOWEL PROBLEMS (unusual diarrhea, constipation, pain near the anus) TENDERNESS IN MOUTH AND THROAT WITH OR WITHOUT PRESENCE OF ULCERS (sore throat, sores in mouth, or a toothache) UNUSUAL RASH, SWELLING OR PAIN  UNUSUAL VAGINAL DISCHARGE OR ITCHING   Items with * indicate a potential emergency and should be followed up as soon as possible or go to the Emergency Department if any problems should occur.  Please show the  CHEMOTHERAPY ALERT CARD or IMMUNOTHERAPY ALERT CARD at check-in to the Emergency Department and triage nurse.  Should you have questions after your visit or need to cancel or reschedule your appointment, please contact [facility] - A DEPT OF [facility]  Dept: [phone]  and follow the prompts.  Office hours are 8:00 a.m. to 4:30 p.m. Monday - Friday. Please note that voicemails left after 4:00 p.m. may not be returned until the following business day.  We are closed weekends and major holidays. You have access to a nurse at all times for urgent questions. Please call the main number to the clinic Dept: [phone] and follow the prompts.   For any non-urgent questions, you may also contact your provider using MyChart. We now offer e-Visits for anyone 80 and older to request care online for non-urgent symptoms. For details visit [email or web address].   Also download the MyChart app! Go to the app store, search MyChart, open the app, select [place], and log in with your MyChart username and password.

## 2024-01-20 NOTE — Progress Notes (Signed)
 Patient seen by Olam Ned NP today  Vitals are within treatment parameters:Yes   Labs are within treatment parameters: Yes   Treatment plan has been signed: Yes   Per physician team, Patient is ready for treatment and there are NO modifications to the treatment plan.

## 2024-01-21 LAB — CANCER ANTIGEN 19-9: CA 19-9: 334 U/mL — ABNORMAL HIGH (ref 0–35)

## 2024-01-22 ENCOUNTER — Inpatient Hospital Stay

## 2024-01-22 NOTE — Patient Instructions (Signed)
 [facility] - A DEPT OF [facility]  Discharge Instructions: Thank you for choosing [facility] to provide your oncology and hematology care.   If you have a lab appointment with the Cancer Center, please go directly to the Cancer Center and check in at the registration area.   Wear comfortable clothing and clothing appropriate for easy access to any Portacath or PICC line.   We strive to give you quality time with your provider. You may need to reschedule your appointment if you arrive late (15 or more minutes).  Arriving late affects you and other patients whose appointments are after yours.  Also, if you miss three or more appointments without notifying the office, you may be dismissed from the clinic at the provider's discretion.      For prescription refill requests, have your pharmacy contact our office and allow 72 hours for refills to be completed.    Today you received the following pump stop.    To help prevent nausea and vomiting after your treatment, we encourage you to take your nausea medication as directed.  BELOW ARE SYMPTOMS THAT SHOULD BE REPORTED IMMEDIATELY: *FEVER GREATER THAN 100.4 F (38 C) OR HIGHER *CHILLS OR SWEATING *NAUSEA AND VOMITING THAT IS NOT CONTROLLED WITH YOUR NAUSEA MEDICATION *UNUSUAL SHORTNESS OF BREATH *UNUSUAL BRUISING OR BLEEDING *URINARY PROBLEMS (pain or burning when urinating, or frequent urination) *BOWEL PROBLEMS (unusual diarrhea, constipation, pain near the anus) TENDERNESS IN MOUTH AND THROAT WITH OR WITHOUT PRESENCE OF ULCERS (sore throat, sores in mouth, or a toothache) UNUSUAL RASH, SWELLING OR PAIN  UNUSUAL VAGINAL DISCHARGE OR ITCHING   Items with * indicate a potential emergency and should be followed up as soon as possible or go to the Emergency Department if any problems should occur.  Please show the CHEMOTHERAPY ALERT CARD or IMMUNOTHERAPY ALERT CARD at check-in to the Emergency  Department and triage nurse.  Should you have questions after your visit or need to cancel or reschedule your appointment, please contact [facility] - A DEPT OF [facility]  Dept: [phone]  and follow the prompts.  Office hours are 8:00 a.m. to 4:30 p.m. Monday - Friday. Please note that voicemails left after 4:00 p.m. may not be returned until the following business day.  We are closed weekends and major holidays. You have access to a nurse at all times for urgent questions. Please call the main number to the clinic Dept: [phone] and follow the prompts.   For any non-urgent questions, you may also contact your provider using MyChart. We now offer e-Visits for anyone 72 and older to request care online for non-urgent symptoms. For details visit [email or web address].   Also download the MyChart app! Go to the app store, search "MyChart", open the app, select [place], and log in with your MyChart username and password.

## 2024-01-22 NOTE — Progress Notes (Signed)
 Patient presents today for pump stop. Patients port flushed without difficulty.  Good blood return noted with no bruising or swelling noted at site.  Band aid applied.  VSS with discharge and left in satisfactory condition with no s/s of distress noted.

## 2024-01-26 LAB — CANCER ANTIGEN 19-9: CA 19-9: 511

## 2024-02-02 ENCOUNTER — Encounter: Payer: Self-pay | Admitting: *Deleted

## 2024-02-02 NOTE — Progress Notes (Signed)
 Call from [facility], where he has transferred his care. Requesting his chemo records be faxed to [phone]. Chemo flowsheets faxed as requested.

## 2024-02-03 ENCOUNTER — Inpatient Hospital Stay

## 2024-02-03 ENCOUNTER — Inpatient Hospital Stay: Admitting: Nurse Practitioner

## 2024-02-05 ENCOUNTER — Inpatient Hospital Stay

## 2024-03-24 ENCOUNTER — Other Ambulatory Visit: Payer: Self-pay
